# Patient Record
Sex: Female | Born: 1965 | Race: White | Hispanic: No | Marital: Married | Smoking: Former smoker
Health system: Southern US, Community
[De-identification: ages and names within clinical notes are randomized; demographics above are authoritative.]

## PROBLEM LIST (undated history)

## (undated) DIAGNOSIS — I2699 Other pulmonary embolism without acute cor pulmonale: Secondary | ICD-10-CM

## (undated) DIAGNOSIS — I10 Essential (primary) hypertension: Secondary | ICD-10-CM

## (undated) DIAGNOSIS — M1990 Unspecified osteoarthritis, unspecified site: Secondary | ICD-10-CM

## (undated) DIAGNOSIS — E079 Disorder of thyroid, unspecified: Secondary | ICD-10-CM

## (undated) DIAGNOSIS — K219 Gastro-esophageal reflux disease without esophagitis: Secondary | ICD-10-CM

## (undated) DIAGNOSIS — Z5189 Encounter for other specified aftercare: Secondary | ICD-10-CM

## (undated) DIAGNOSIS — N189 Chronic kidney disease, unspecified: Secondary | ICD-10-CM

## (undated) DIAGNOSIS — E785 Hyperlipidemia, unspecified: Secondary | ICD-10-CM

## (undated) DIAGNOSIS — F419 Anxiety disorder, unspecified: Secondary | ICD-10-CM

## (undated) DIAGNOSIS — F32A Depression, unspecified: Secondary | ICD-10-CM

## (undated) DIAGNOSIS — J939 Pneumothorax, unspecified: Secondary | ICD-10-CM

## (undated) HISTORY — PX: TUBAL LIGATION: SHX77

## (undated) HISTORY — PX: CHOLECYSTECTOMY: SHX55

## (undated) HISTORY — DX: Disorder of thyroid, unspecified: E07.9

## (undated) HISTORY — DX: Hyperlipidemia, unspecified: E78.5

## (undated) HISTORY — DX: Gastro-esophageal reflux disease without esophagitis: K21.9

## (undated) HISTORY — PX: CARPAL TUNNEL RELEASE: SHX101

## (undated) HISTORY — DX: Depression, unspecified: F32.A

## (undated) HISTORY — DX: Unspecified osteoarthritis, unspecified site: M19.90

## (undated) HISTORY — DX: Anxiety disorder, unspecified: F41.9

## (undated) HISTORY — DX: Chronic kidney disease, unspecified: N18.9

## (undated) HISTORY — DX: Essential (primary) hypertension: I10

## (undated) HISTORY — DX: Encounter for other specified aftercare: Z51.89

## (undated) HISTORY — DX: Pneumothorax, unspecified: J93.9

## (undated) HISTORY — DX: Other pulmonary embolism without acute cor pulmonale: I26.99

---
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
## 2018-06-19 DIAGNOSIS — E079 Disorder of thyroid, unspecified: Secondary | ICD-10-CM | POA: Insufficient documentation

## 2019-04-02 DIAGNOSIS — J320 Chronic maxillary sinusitis: Secondary | ICD-10-CM | POA: Insufficient documentation

## 2019-04-02 DIAGNOSIS — J322 Chronic ethmoidal sinusitis: Secondary | ICD-10-CM | POA: Insufficient documentation

## 2019-04-02 DIAGNOSIS — J339 Nasal polyp, unspecified: Secondary | ICD-10-CM | POA: Insufficient documentation

## 2019-04-02 DIAGNOSIS — J321 Chronic frontal sinusitis: Secondary | ICD-10-CM | POA: Insufficient documentation

## 2020-03-13 DIAGNOSIS — F329 Major depressive disorder, single episode, unspecified: Secondary | ICD-10-CM | POA: Insufficient documentation

## 2020-03-13 DIAGNOSIS — I10 Essential (primary) hypertension: Secondary | ICD-10-CM | POA: Insufficient documentation

## 2020-03-24 DIAGNOSIS — M542 Cervicalgia: Secondary | ICD-10-CM | POA: Insufficient documentation

## 2020-03-24 DIAGNOSIS — G8929 Other chronic pain: Secondary | ICD-10-CM | POA: Insufficient documentation

## 2020-12-26 DIAGNOSIS — U071 COVID-19: Secondary | ICD-10-CM

## 2020-12-26 HISTORY — DX: COVID-19: U07.1

## 2021-01-21 DIAGNOSIS — E785 Hyperlipidemia, unspecified: Secondary | ICD-10-CM | POA: Insufficient documentation

## 2021-01-26 DIAGNOSIS — J1282 Pneumonia due to coronavirus disease 2019: Secondary | ICD-10-CM | POA: Diagnosis not present

## 2021-01-26 DIAGNOSIS — U071 COVID-19: Secondary | ICD-10-CM | POA: Diagnosis not present

## 2021-01-26 DIAGNOSIS — N3090 Cystitis, unspecified without hematuria: Secondary | ICD-10-CM | POA: Diagnosis not present

## 2021-01-26 DIAGNOSIS — E079 Disorder of thyroid, unspecified: Secondary | ICD-10-CM | POA: Diagnosis not present

## 2021-01-26 DIAGNOSIS — E785 Hyperlipidemia, unspecified: Secondary | ICD-10-CM | POA: Diagnosis not present

## 2021-01-26 DIAGNOSIS — Z6841 Body Mass Index (BMI) 40.0 and over, adult: Secondary | ICD-10-CM | POA: Diagnosis not present

## 2021-01-26 DIAGNOSIS — I2699 Other pulmonary embolism without acute cor pulmonale: Secondary | ICD-10-CM | POA: Diagnosis not present

## 2021-01-26 DIAGNOSIS — R918 Other nonspecific abnormal finding of lung field: Secondary | ICD-10-CM | POA: Diagnosis not present

## 2021-01-26 DIAGNOSIS — J80 Acute respiratory distress syndrome: Secondary | ICD-10-CM | POA: Diagnosis not present

## 2021-01-26 DIAGNOSIS — N179 Acute kidney failure, unspecified: Secondary | ICD-10-CM | POA: Diagnosis not present

## 2021-01-26 DIAGNOSIS — J9601 Acute respiratory failure with hypoxia: Secondary | ICD-10-CM | POA: Diagnosis not present

## 2021-01-26 DIAGNOSIS — Z452 Encounter for adjustment and management of vascular access device: Secondary | ICD-10-CM | POA: Diagnosis not present

## 2021-01-26 DIAGNOSIS — I10 Essential (primary) hypertension: Secondary | ICD-10-CM | POA: Diagnosis not present

## 2021-01-27 DIAGNOSIS — I2699 Other pulmonary embolism without acute cor pulmonale: Secondary | ICD-10-CM | POA: Diagnosis not present

## 2021-01-27 DIAGNOSIS — U071 COVID-19: Secondary | ICD-10-CM | POA: Diagnosis not present

## 2021-01-27 DIAGNOSIS — J9601 Acute respiratory failure with hypoxia: Secondary | ICD-10-CM | POA: Diagnosis not present

## 2021-01-27 DIAGNOSIS — N3090 Cystitis, unspecified without hematuria: Secondary | ICD-10-CM | POA: Diagnosis not present

## 2021-01-27 DIAGNOSIS — E079 Disorder of thyroid, unspecified: Secondary | ICD-10-CM | POA: Diagnosis not present

## 2021-01-27 DIAGNOSIS — J1282 Pneumonia due to coronavirus disease 2019: Secondary | ICD-10-CM | POA: Diagnosis not present

## 2021-01-27 DIAGNOSIS — E785 Hyperlipidemia, unspecified: Secondary | ICD-10-CM | POA: Diagnosis not present

## 2021-01-27 DIAGNOSIS — I10 Essential (primary) hypertension: Secondary | ICD-10-CM | POA: Diagnosis not present

## 2021-01-27 DIAGNOSIS — J80 Acute respiratory distress syndrome: Secondary | ICD-10-CM | POA: Diagnosis not present

## 2021-01-27 DIAGNOSIS — N19 Unspecified kidney failure: Secondary | ICD-10-CM | POA: Diagnosis not present

## 2021-01-27 DIAGNOSIS — N179 Acute kidney failure, unspecified: Secondary | ICD-10-CM | POA: Diagnosis not present

## 2021-01-28 DIAGNOSIS — R748 Abnormal levels of other serum enzymes: Secondary | ICD-10-CM | POA: Insufficient documentation

## 2021-01-28 DIAGNOSIS — J1282 Pneumonia due to coronavirus disease 2019: Secondary | ICD-10-CM | POA: Diagnosis not present

## 2021-01-28 DIAGNOSIS — U071 COVID-19: Secondary | ICD-10-CM | POA: Diagnosis not present

## 2021-01-28 DIAGNOSIS — J9601 Acute respiratory failure with hypoxia: Secondary | ICD-10-CM | POA: Diagnosis not present

## 2021-01-28 DIAGNOSIS — I2699 Other pulmonary embolism without acute cor pulmonale: Secondary | ICD-10-CM | POA: Diagnosis not present

## 2021-01-29 DIAGNOSIS — J9602 Acute respiratory failure with hypercapnia: Secondary | ICD-10-CM | POA: Diagnosis not present

## 2021-01-29 DIAGNOSIS — R748 Abnormal levels of other serum enzymes: Secondary | ICD-10-CM | POA: Diagnosis not present

## 2021-01-29 DIAGNOSIS — R918 Other nonspecific abnormal finding of lung field: Secondary | ICD-10-CM | POA: Diagnosis not present

## 2021-01-29 DIAGNOSIS — E079 Disorder of thyroid, unspecified: Secondary | ICD-10-CM | POA: Diagnosis not present

## 2021-01-29 DIAGNOSIS — N170 Acute kidney failure with tubular necrosis: Secondary | ICD-10-CM | POA: Diagnosis not present

## 2021-01-29 DIAGNOSIS — Z4682 Encounter for fitting and adjustment of non-vascular catheter: Secondary | ICD-10-CM | POA: Diagnosis not present

## 2021-01-29 DIAGNOSIS — J9601 Acute respiratory failure with hypoxia: Secondary | ICD-10-CM | POA: Diagnosis not present

## 2021-01-29 DIAGNOSIS — E875 Hyperkalemia: Secondary | ICD-10-CM | POA: Diagnosis not present

## 2021-01-29 DIAGNOSIS — I2699 Other pulmonary embolism without acute cor pulmonale: Secondary | ICD-10-CM | POA: Diagnosis not present

## 2021-01-29 DIAGNOSIS — J1282 Pneumonia due to coronavirus disease 2019: Secondary | ICD-10-CM | POA: Diagnosis not present

## 2021-01-29 DIAGNOSIS — U071 COVID-19: Secondary | ICD-10-CM | POA: Diagnosis not present

## 2021-01-29 DIAGNOSIS — D6869 Other thrombophilia: Secondary | ICD-10-CM | POA: Diagnosis not present

## 2021-01-29 DIAGNOSIS — N179 Acute kidney failure, unspecified: Secondary | ICD-10-CM | POA: Diagnosis not present

## 2021-01-29 DIAGNOSIS — I513 Intracardiac thrombosis, not elsewhere classified: Secondary | ICD-10-CM | POA: Diagnosis not present

## 2021-01-30 DIAGNOSIS — J9601 Acute respiratory failure with hypoxia: Secondary | ICD-10-CM | POA: Diagnosis not present

## 2021-01-30 DIAGNOSIS — N179 Acute kidney failure, unspecified: Secondary | ICD-10-CM | POA: Diagnosis not present

## 2021-01-30 DIAGNOSIS — U071 COVID-19: Secondary | ICD-10-CM | POA: Diagnosis not present

## 2021-01-30 DIAGNOSIS — E875 Hyperkalemia: Secondary | ICD-10-CM | POA: Diagnosis not present

## 2021-01-30 DIAGNOSIS — R748 Abnormal levels of other serum enzymes: Secondary | ICD-10-CM | POA: Diagnosis not present

## 2021-01-30 DIAGNOSIS — D6869 Other thrombophilia: Secondary | ICD-10-CM | POA: Diagnosis not present

## 2021-01-30 DIAGNOSIS — I2699 Other pulmonary embolism without acute cor pulmonale: Secondary | ICD-10-CM | POA: Diagnosis not present

## 2021-01-30 DIAGNOSIS — E079 Disorder of thyroid, unspecified: Secondary | ICD-10-CM | POA: Diagnosis not present

## 2021-01-30 DIAGNOSIS — J9602 Acute respiratory failure with hypercapnia: Secondary | ICD-10-CM | POA: Diagnosis not present

## 2021-01-31 DIAGNOSIS — E079 Disorder of thyroid, unspecified: Secondary | ICD-10-CM | POA: Diagnosis not present

## 2021-01-31 DIAGNOSIS — R238 Other skin changes: Secondary | ICD-10-CM | POA: Diagnosis not present

## 2021-01-31 DIAGNOSIS — D6869 Other thrombophilia: Secondary | ICD-10-CM | POA: Diagnosis not present

## 2021-01-31 DIAGNOSIS — R748 Abnormal levels of other serum enzymes: Secondary | ICD-10-CM | POA: Diagnosis not present

## 2021-01-31 DIAGNOSIS — U071 COVID-19: Secondary | ICD-10-CM | POA: Diagnosis not present

## 2021-01-31 DIAGNOSIS — N179 Acute kidney failure, unspecified: Secondary | ICD-10-CM | POA: Diagnosis not present

## 2021-01-31 DIAGNOSIS — J9602 Acute respiratory failure with hypercapnia: Secondary | ICD-10-CM | POA: Diagnosis not present

## 2021-01-31 DIAGNOSIS — J9601 Acute respiratory failure with hypoxia: Secondary | ICD-10-CM | POA: Diagnosis not present

## 2021-01-31 DIAGNOSIS — R600 Localized edema: Secondary | ICD-10-CM | POA: Diagnosis not present

## 2021-01-31 DIAGNOSIS — E875 Hyperkalemia: Secondary | ICD-10-CM | POA: Diagnosis not present

## 2021-01-31 DIAGNOSIS — I2699 Other pulmonary embolism without acute cor pulmonale: Secondary | ICD-10-CM | POA: Diagnosis not present

## 2021-02-01 DIAGNOSIS — E079 Disorder of thyroid, unspecified: Secondary | ICD-10-CM | POA: Diagnosis not present

## 2021-02-01 DIAGNOSIS — N179 Acute kidney failure, unspecified: Secondary | ICD-10-CM | POA: Diagnosis not present

## 2021-02-01 DIAGNOSIS — E875 Hyperkalemia: Secondary | ICD-10-CM | POA: Diagnosis not present

## 2021-02-01 DIAGNOSIS — D6869 Other thrombophilia: Secondary | ICD-10-CM | POA: Diagnosis not present

## 2021-02-01 DIAGNOSIS — U071 COVID-19: Secondary | ICD-10-CM | POA: Diagnosis not present

## 2021-02-01 DIAGNOSIS — J9602 Acute respiratory failure with hypercapnia: Secondary | ICD-10-CM | POA: Diagnosis not present

## 2021-02-01 DIAGNOSIS — R748 Abnormal levels of other serum enzymes: Secondary | ICD-10-CM | POA: Diagnosis not present

## 2021-02-01 DIAGNOSIS — J9601 Acute respiratory failure with hypoxia: Secondary | ICD-10-CM | POA: Diagnosis not present

## 2021-02-01 DIAGNOSIS — I2699 Other pulmonary embolism without acute cor pulmonale: Secondary | ICD-10-CM | POA: Diagnosis not present

## 2021-02-02 DIAGNOSIS — J9601 Acute respiratory failure with hypoxia: Secondary | ICD-10-CM | POA: Diagnosis not present

## 2021-02-02 DIAGNOSIS — N179 Acute kidney failure, unspecified: Secondary | ICD-10-CM | POA: Diagnosis not present

## 2021-02-02 DIAGNOSIS — R748 Abnormal levels of other serum enzymes: Secondary | ICD-10-CM | POA: Diagnosis not present

## 2021-02-02 DIAGNOSIS — E079 Disorder of thyroid, unspecified: Secondary | ICD-10-CM | POA: Diagnosis not present

## 2021-02-02 DIAGNOSIS — D6869 Other thrombophilia: Secondary | ICD-10-CM | POA: Diagnosis not present

## 2021-02-02 DIAGNOSIS — J9602 Acute respiratory failure with hypercapnia: Secondary | ICD-10-CM | POA: Diagnosis not present

## 2021-02-02 DIAGNOSIS — I2699 Other pulmonary embolism without acute cor pulmonale: Secondary | ICD-10-CM | POA: Diagnosis not present

## 2021-02-02 DIAGNOSIS — E875 Hyperkalemia: Secondary | ICD-10-CM | POA: Diagnosis not present

## 2021-02-02 DIAGNOSIS — U071 COVID-19: Secondary | ICD-10-CM | POA: Diagnosis not present

## 2021-02-03 DIAGNOSIS — I2699 Other pulmonary embolism without acute cor pulmonale: Secondary | ICD-10-CM | POA: Diagnosis not present

## 2021-02-03 DIAGNOSIS — J9602 Acute respiratory failure with hypercapnia: Secondary | ICD-10-CM | POA: Diagnosis not present

## 2021-02-03 DIAGNOSIS — E875 Hyperkalemia: Secondary | ICD-10-CM | POA: Diagnosis not present

## 2021-02-03 DIAGNOSIS — E079 Disorder of thyroid, unspecified: Secondary | ICD-10-CM | POA: Diagnosis not present

## 2021-02-03 DIAGNOSIS — J9601 Acute respiratory failure with hypoxia: Secondary | ICD-10-CM | POA: Diagnosis not present

## 2021-02-03 DIAGNOSIS — D6869 Other thrombophilia: Secondary | ICD-10-CM | POA: Diagnosis not present

## 2021-02-03 DIAGNOSIS — U071 COVID-19: Secondary | ICD-10-CM | POA: Diagnosis not present

## 2021-02-03 DIAGNOSIS — R748 Abnormal levels of other serum enzymes: Secondary | ICD-10-CM | POA: Diagnosis not present

## 2021-02-03 DIAGNOSIS — N179 Acute kidney failure, unspecified: Secondary | ICD-10-CM | POA: Diagnosis not present

## 2021-02-04 DIAGNOSIS — U071 COVID-19: Secondary | ICD-10-CM | POA: Diagnosis not present

## 2021-02-04 DIAGNOSIS — N179 Acute kidney failure, unspecified: Secondary | ICD-10-CM | POA: Diagnosis not present

## 2021-02-04 DIAGNOSIS — Z4682 Encounter for fitting and adjustment of non-vascular catheter: Secondary | ICD-10-CM | POA: Diagnosis not present

## 2021-02-04 DIAGNOSIS — E079 Disorder of thyroid, unspecified: Secondary | ICD-10-CM | POA: Diagnosis not present

## 2021-02-04 DIAGNOSIS — I2699 Other pulmonary embolism without acute cor pulmonale: Secondary | ICD-10-CM | POA: Diagnosis not present

## 2021-02-04 DIAGNOSIS — J9601 Acute respiratory failure with hypoxia: Secondary | ICD-10-CM | POA: Diagnosis not present

## 2021-02-04 DIAGNOSIS — E875 Hyperkalemia: Secondary | ICD-10-CM | POA: Diagnosis not present

## 2021-02-04 DIAGNOSIS — R0602 Shortness of breath: Secondary | ICD-10-CM | POA: Diagnosis not present

## 2021-02-04 DIAGNOSIS — J984 Other disorders of lung: Secondary | ICD-10-CM | POA: Diagnosis not present

## 2021-02-04 DIAGNOSIS — R748 Abnormal levels of other serum enzymes: Secondary | ICD-10-CM | POA: Diagnosis not present

## 2021-02-04 DIAGNOSIS — D6869 Other thrombophilia: Secondary | ICD-10-CM | POA: Diagnosis not present

## 2021-02-04 DIAGNOSIS — J9602 Acute respiratory failure with hypercapnia: Secondary | ICD-10-CM | POA: Diagnosis not present

## 2021-02-05 DIAGNOSIS — Z4682 Encounter for fitting and adjustment of non-vascular catheter: Secondary | ICD-10-CM | POA: Diagnosis not present

## 2021-02-05 DIAGNOSIS — J9602 Acute respiratory failure with hypercapnia: Secondary | ICD-10-CM | POA: Diagnosis not present

## 2021-02-05 DIAGNOSIS — D6869 Other thrombophilia: Secondary | ICD-10-CM | POA: Diagnosis not present

## 2021-02-05 DIAGNOSIS — J9601 Acute respiratory failure with hypoxia: Secondary | ICD-10-CM | POA: Diagnosis not present

## 2021-02-05 DIAGNOSIS — I513 Intracardiac thrombosis, not elsewhere classified: Secondary | ICD-10-CM | POA: Diagnosis not present

## 2021-02-05 DIAGNOSIS — J1282 Pneumonia due to coronavirus disease 2019: Secondary | ICD-10-CM | POA: Diagnosis not present

## 2021-02-05 DIAGNOSIS — J9383 Other pneumothorax: Secondary | ICD-10-CM | POA: Diagnosis not present

## 2021-02-05 DIAGNOSIS — U071 COVID-19: Secondary | ICD-10-CM | POA: Diagnosis not present

## 2021-02-05 DIAGNOSIS — N170 Acute kidney failure with tubular necrosis: Secondary | ICD-10-CM | POA: Diagnosis not present

## 2021-02-05 DIAGNOSIS — N179 Acute kidney failure, unspecified: Secondary | ICD-10-CM | POA: Diagnosis not present

## 2021-02-06 DIAGNOSIS — Z4682 Encounter for fitting and adjustment of non-vascular catheter: Secondary | ICD-10-CM | POA: Diagnosis not present

## 2021-02-06 DIAGNOSIS — U071 COVID-19: Secondary | ICD-10-CM | POA: Diagnosis not present

## 2021-02-06 DIAGNOSIS — R0603 Acute respiratory distress: Secondary | ICD-10-CM | POA: Diagnosis not present

## 2021-02-06 DIAGNOSIS — K7290 Hepatic failure, unspecified without coma: Secondary | ICD-10-CM | POA: Diagnosis not present

## 2021-02-06 DIAGNOSIS — Z992 Dependence on renal dialysis: Secondary | ICD-10-CM | POA: Diagnosis not present

## 2021-02-06 DIAGNOSIS — J1282 Pneumonia due to coronavirus disease 2019: Secondary | ICD-10-CM | POA: Diagnosis not present

## 2021-02-06 DIAGNOSIS — R001 Bradycardia, unspecified: Secondary | ICD-10-CM | POA: Diagnosis not present

## 2021-02-06 DIAGNOSIS — D6869 Other thrombophilia: Secondary | ICD-10-CM | POA: Diagnosis not present

## 2021-02-06 DIAGNOSIS — I513 Intracardiac thrombosis, not elsewhere classified: Secondary | ICD-10-CM | POA: Diagnosis not present

## 2021-02-06 DIAGNOSIS — J939 Pneumothorax, unspecified: Secondary | ICD-10-CM | POA: Diagnosis not present

## 2021-02-06 DIAGNOSIS — N170 Acute kidney failure with tubular necrosis: Secondary | ICD-10-CM | POA: Diagnosis not present

## 2021-02-06 DIAGNOSIS — J9602 Acute respiratory failure with hypercapnia: Secondary | ICD-10-CM | POA: Diagnosis not present

## 2021-02-06 DIAGNOSIS — J9383 Other pneumothorax: Secondary | ICD-10-CM | POA: Diagnosis not present

## 2021-02-06 DIAGNOSIS — I455 Other specified heart block: Secondary | ICD-10-CM | POA: Diagnosis not present

## 2021-02-06 DIAGNOSIS — N179 Acute kidney failure, unspecified: Secondary | ICD-10-CM | POA: Diagnosis not present

## 2021-02-06 DIAGNOSIS — J9601 Acute respiratory failure with hypoxia: Secondary | ICD-10-CM | POA: Diagnosis not present

## 2021-02-07 DIAGNOSIS — J9602 Acute respiratory failure with hypercapnia: Secondary | ICD-10-CM | POA: Diagnosis not present

## 2021-02-07 DIAGNOSIS — I513 Intracardiac thrombosis, not elsewhere classified: Secondary | ICD-10-CM | POA: Diagnosis not present

## 2021-02-07 DIAGNOSIS — U071 COVID-19: Secondary | ICD-10-CM | POA: Diagnosis not present

## 2021-02-07 DIAGNOSIS — R0602 Shortness of breath: Secondary | ICD-10-CM | POA: Diagnosis not present

## 2021-02-07 DIAGNOSIS — I455 Other specified heart block: Secondary | ICD-10-CM | POA: Diagnosis not present

## 2021-02-07 DIAGNOSIS — Z992 Dependence on renal dialysis: Secondary | ICD-10-CM | POA: Diagnosis not present

## 2021-02-07 DIAGNOSIS — K7290 Hepatic failure, unspecified without coma: Secondary | ICD-10-CM | POA: Diagnosis not present

## 2021-02-07 DIAGNOSIS — D6869 Other thrombophilia: Secondary | ICD-10-CM | POA: Diagnosis not present

## 2021-02-07 DIAGNOSIS — R001 Bradycardia, unspecified: Secondary | ICD-10-CM | POA: Diagnosis not present

## 2021-02-07 DIAGNOSIS — N179 Acute kidney failure, unspecified: Secondary | ICD-10-CM | POA: Diagnosis not present

## 2021-02-07 DIAGNOSIS — N170 Acute kidney failure with tubular necrosis: Secondary | ICD-10-CM | POA: Diagnosis not present

## 2021-02-07 DIAGNOSIS — J1282 Pneumonia due to coronavirus disease 2019: Secondary | ICD-10-CM | POA: Diagnosis not present

## 2021-02-07 DIAGNOSIS — J9601 Acute respiratory failure with hypoxia: Secondary | ICD-10-CM | POA: Diagnosis not present

## 2021-02-08 DIAGNOSIS — R4182 Altered mental status, unspecified: Secondary | ICD-10-CM | POA: Diagnosis not present

## 2021-02-08 DIAGNOSIS — J1282 Pneumonia due to coronavirus disease 2019: Secondary | ICD-10-CM | POA: Diagnosis not present

## 2021-02-08 DIAGNOSIS — I2699 Other pulmonary embolism without acute cor pulmonale: Secondary | ICD-10-CM | POA: Diagnosis not present

## 2021-02-08 DIAGNOSIS — U071 COVID-19: Secondary | ICD-10-CM | POA: Diagnosis not present

## 2021-02-08 DIAGNOSIS — D6859 Other primary thrombophilia: Secondary | ICD-10-CM | POA: Diagnosis not present

## 2021-02-08 DIAGNOSIS — J9601 Acute respiratory failure with hypoxia: Secondary | ICD-10-CM | POA: Diagnosis not present

## 2021-02-08 DIAGNOSIS — N179 Acute kidney failure, unspecified: Secondary | ICD-10-CM | POA: Diagnosis not present

## 2021-02-08 DIAGNOSIS — R579 Shock, unspecified: Secondary | ICD-10-CM | POA: Diagnosis not present

## 2021-02-08 DIAGNOSIS — J939 Pneumothorax, unspecified: Secondary | ICD-10-CM | POA: Diagnosis not present

## 2021-02-08 DIAGNOSIS — I513 Intracardiac thrombosis, not elsewhere classified: Secondary | ICD-10-CM | POA: Diagnosis not present

## 2021-02-09 DIAGNOSIS — J9601 Acute respiratory failure with hypoxia: Secondary | ICD-10-CM | POA: Diagnosis not present

## 2021-02-09 DIAGNOSIS — I513 Intracardiac thrombosis, not elsewhere classified: Secondary | ICD-10-CM | POA: Diagnosis not present

## 2021-02-09 DIAGNOSIS — Z4682 Encounter for fitting and adjustment of non-vascular catheter: Secondary | ICD-10-CM | POA: Diagnosis not present

## 2021-02-09 DIAGNOSIS — J1282 Pneumonia due to coronavirus disease 2019: Secondary | ICD-10-CM | POA: Diagnosis not present

## 2021-02-09 DIAGNOSIS — R579 Shock, unspecified: Secondary | ICD-10-CM | POA: Diagnosis not present

## 2021-02-09 DIAGNOSIS — I2699 Other pulmonary embolism without acute cor pulmonale: Secondary | ICD-10-CM | POA: Diagnosis not present

## 2021-02-09 DIAGNOSIS — D6859 Other primary thrombophilia: Secondary | ICD-10-CM | POA: Diagnosis not present

## 2021-02-09 DIAGNOSIS — N179 Acute kidney failure, unspecified: Secondary | ICD-10-CM | POA: Diagnosis not present

## 2021-02-09 DIAGNOSIS — U071 COVID-19: Secondary | ICD-10-CM | POA: Diagnosis not present

## 2021-02-09 DIAGNOSIS — J984 Other disorders of lung: Secondary | ICD-10-CM | POA: Diagnosis not present

## 2021-02-09 DIAGNOSIS — J939 Pneumothorax, unspecified: Secondary | ICD-10-CM | POA: Diagnosis not present

## 2021-02-09 DIAGNOSIS — R0602 Shortness of breath: Secondary | ICD-10-CM | POA: Diagnosis not present

## 2021-02-10 DIAGNOSIS — J1282 Pneumonia due to coronavirus disease 2019: Secondary | ICD-10-CM | POA: Diagnosis not present

## 2021-02-10 DIAGNOSIS — J9620 Acute and chronic respiratory failure, unspecified whether with hypoxia or hypercapnia: Secondary | ICD-10-CM | POA: Diagnosis not present

## 2021-02-10 DIAGNOSIS — R140 Abdominal distension (gaseous): Secondary | ICD-10-CM | POA: Diagnosis not present

## 2021-02-10 DIAGNOSIS — R579 Shock, unspecified: Secondary | ICD-10-CM | POA: Diagnosis not present

## 2021-02-10 DIAGNOSIS — J9601 Acute respiratory failure with hypoxia: Secondary | ICD-10-CM | POA: Diagnosis not present

## 2021-02-10 DIAGNOSIS — B379 Candidiasis, unspecified: Secondary | ICD-10-CM | POA: Diagnosis not present

## 2021-02-10 DIAGNOSIS — I2699 Other pulmonary embolism without acute cor pulmonale: Secondary | ICD-10-CM | POA: Diagnosis not present

## 2021-02-10 DIAGNOSIS — J80 Acute respiratory distress syndrome: Secondary | ICD-10-CM | POA: Diagnosis not present

## 2021-02-10 DIAGNOSIS — D6859 Other primary thrombophilia: Secondary | ICD-10-CM | POA: Diagnosis not present

## 2021-02-10 DIAGNOSIS — Z4659 Encounter for fitting and adjustment of other gastrointestinal appliance and device: Secondary | ICD-10-CM | POA: Diagnosis not present

## 2021-02-10 DIAGNOSIS — N179 Acute kidney failure, unspecified: Secondary | ICD-10-CM | POA: Diagnosis not present

## 2021-02-10 DIAGNOSIS — Z4682 Encounter for fitting and adjustment of non-vascular catheter: Secondary | ICD-10-CM | POA: Diagnosis not present

## 2021-02-10 DIAGNOSIS — R918 Other nonspecific abnormal finding of lung field: Secondary | ICD-10-CM | POA: Diagnosis not present

## 2021-02-10 DIAGNOSIS — I513 Intracardiac thrombosis, not elsewhere classified: Secondary | ICD-10-CM | POA: Diagnosis not present

## 2021-02-10 DIAGNOSIS — U071 COVID-19: Secondary | ICD-10-CM | POA: Diagnosis not present

## 2021-02-10 DIAGNOSIS — R0602 Shortness of breath: Secondary | ICD-10-CM | POA: Diagnosis not present

## 2021-02-10 DIAGNOSIS — J939 Pneumothorax, unspecified: Secondary | ICD-10-CM | POA: Diagnosis not present

## 2021-02-11 DIAGNOSIS — J939 Pneumothorax, unspecified: Secondary | ICD-10-CM | POA: Diagnosis not present

## 2021-02-11 DIAGNOSIS — D6859 Other primary thrombophilia: Secondary | ICD-10-CM | POA: Diagnosis not present

## 2021-02-11 DIAGNOSIS — R579 Shock, unspecified: Secondary | ICD-10-CM | POA: Diagnosis not present

## 2021-02-11 DIAGNOSIS — I513 Intracardiac thrombosis, not elsewhere classified: Secondary | ICD-10-CM | POA: Diagnosis not present

## 2021-02-11 DIAGNOSIS — J1282 Pneumonia due to coronavirus disease 2019: Secondary | ICD-10-CM | POA: Diagnosis not present

## 2021-02-11 DIAGNOSIS — N179 Acute kidney failure, unspecified: Secondary | ICD-10-CM | POA: Diagnosis not present

## 2021-02-11 DIAGNOSIS — I2699 Other pulmonary embolism without acute cor pulmonale: Secondary | ICD-10-CM | POA: Diagnosis not present

## 2021-02-11 DIAGNOSIS — J9601 Acute respiratory failure with hypoxia: Secondary | ICD-10-CM | POA: Diagnosis not present

## 2021-02-11 DIAGNOSIS — U071 COVID-19: Secondary | ICD-10-CM | POA: Diagnosis not present

## 2021-02-12 DIAGNOSIS — N179 Acute kidney failure, unspecified: Secondary | ICD-10-CM | POA: Diagnosis not present

## 2021-02-12 DIAGNOSIS — U071 COVID-19: Secondary | ICD-10-CM | POA: Diagnosis not present

## 2021-02-12 DIAGNOSIS — J939 Pneumothorax, unspecified: Secondary | ICD-10-CM | POA: Diagnosis not present

## 2021-02-12 DIAGNOSIS — J1282 Pneumonia due to coronavirus disease 2019: Secondary | ICD-10-CM | POA: Diagnosis not present

## 2021-02-12 DIAGNOSIS — I2699 Other pulmonary embolism without acute cor pulmonale: Secondary | ICD-10-CM | POA: Diagnosis not present

## 2021-02-12 DIAGNOSIS — J9601 Acute respiratory failure with hypoxia: Secondary | ICD-10-CM | POA: Diagnosis not present

## 2021-02-12 DIAGNOSIS — I513 Intracardiac thrombosis, not elsewhere classified: Secondary | ICD-10-CM | POA: Diagnosis not present

## 2021-02-12 DIAGNOSIS — Z452 Encounter for adjustment and management of vascular access device: Secondary | ICD-10-CM | POA: Diagnosis not present

## 2021-02-12 DIAGNOSIS — N19 Unspecified kidney failure: Secondary | ICD-10-CM | POA: Diagnosis not present

## 2021-02-12 DIAGNOSIS — R579 Shock, unspecified: Secondary | ICD-10-CM | POA: Diagnosis not present

## 2021-02-12 DIAGNOSIS — D6859 Other primary thrombophilia: Secondary | ICD-10-CM | POA: Diagnosis not present

## 2021-02-13 DIAGNOSIS — D6859 Other primary thrombophilia: Secondary | ICD-10-CM | POA: Diagnosis not present

## 2021-02-13 DIAGNOSIS — J90 Pleural effusion, not elsewhere classified: Secondary | ICD-10-CM | POA: Diagnosis not present

## 2021-02-13 DIAGNOSIS — J939 Pneumothorax, unspecified: Secondary | ICD-10-CM | POA: Diagnosis not present

## 2021-02-13 DIAGNOSIS — N179 Acute kidney failure, unspecified: Secondary | ICD-10-CM | POA: Diagnosis not present

## 2021-02-13 DIAGNOSIS — J1282 Pneumonia due to coronavirus disease 2019: Secondary | ICD-10-CM | POA: Diagnosis not present

## 2021-02-13 DIAGNOSIS — N1330 Unspecified hydronephrosis: Secondary | ICD-10-CM | POA: Diagnosis not present

## 2021-02-13 DIAGNOSIS — I513 Intracardiac thrombosis, not elsewhere classified: Secondary | ICD-10-CM | POA: Diagnosis not present

## 2021-02-13 DIAGNOSIS — R579 Shock, unspecified: Secondary | ICD-10-CM | POA: Diagnosis not present

## 2021-02-13 DIAGNOSIS — U071 COVID-19: Secondary | ICD-10-CM | POA: Diagnosis not present

## 2021-02-13 DIAGNOSIS — I2699 Other pulmonary embolism without acute cor pulmonale: Secondary | ICD-10-CM | POA: Diagnosis not present

## 2021-02-13 DIAGNOSIS — R930 Abnormal findings on diagnostic imaging of skull and head, not elsewhere classified: Secondary | ICD-10-CM | POA: Diagnosis not present

## 2021-02-13 DIAGNOSIS — J9601 Acute respiratory failure with hypoxia: Secondary | ICD-10-CM | POA: Diagnosis not present

## 2021-02-14 DIAGNOSIS — I513 Intracardiac thrombosis, not elsewhere classified: Secondary | ICD-10-CM | POA: Diagnosis not present

## 2021-02-14 DIAGNOSIS — R748 Abnormal levels of other serum enzymes: Secondary | ICD-10-CM | POA: Diagnosis not present

## 2021-02-14 DIAGNOSIS — N179 Acute kidney failure, unspecified: Secondary | ICD-10-CM | POA: Diagnosis not present

## 2021-02-14 DIAGNOSIS — R1310 Dysphagia, unspecified: Secondary | ICD-10-CM | POA: Diagnosis not present

## 2021-02-14 DIAGNOSIS — J9601 Acute respiratory failure with hypoxia: Secondary | ICD-10-CM | POA: Diagnosis not present

## 2021-02-14 DIAGNOSIS — E079 Disorder of thyroid, unspecified: Secondary | ICD-10-CM | POA: Diagnosis not present

## 2021-02-14 DIAGNOSIS — U071 COVID-19: Secondary | ICD-10-CM | POA: Diagnosis not present

## 2021-02-14 DIAGNOSIS — J9312 Secondary spontaneous pneumothorax: Secondary | ICD-10-CM | POA: Diagnosis not present

## 2021-02-14 DIAGNOSIS — I2699 Other pulmonary embolism without acute cor pulmonale: Secondary | ICD-10-CM | POA: Diagnosis not present

## 2021-02-14 DIAGNOSIS — J9602 Acute respiratory failure with hypercapnia: Secondary | ICD-10-CM | POA: Diagnosis not present

## 2021-02-14 DIAGNOSIS — E875 Hyperkalemia: Secondary | ICD-10-CM | POA: Diagnosis not present

## 2021-02-15 DIAGNOSIS — R1310 Dysphagia, unspecified: Secondary | ICD-10-CM | POA: Diagnosis not present

## 2021-02-15 DIAGNOSIS — J9602 Acute respiratory failure with hypercapnia: Secondary | ICD-10-CM | POA: Diagnosis not present

## 2021-02-15 DIAGNOSIS — D6869 Other thrombophilia: Secondary | ICD-10-CM | POA: Diagnosis not present

## 2021-02-15 DIAGNOSIS — J9312 Secondary spontaneous pneumothorax: Secondary | ICD-10-CM | POA: Diagnosis not present

## 2021-02-15 DIAGNOSIS — K7200 Acute and subacute hepatic failure without coma: Secondary | ICD-10-CM | POA: Diagnosis not present

## 2021-02-15 DIAGNOSIS — J1282 Pneumonia due to coronavirus disease 2019: Secondary | ICD-10-CM | POA: Diagnosis not present

## 2021-02-15 DIAGNOSIS — I2699 Other pulmonary embolism without acute cor pulmonale: Secondary | ICD-10-CM | POA: Diagnosis not present

## 2021-02-15 DIAGNOSIS — J9601 Acute respiratory failure with hypoxia: Secondary | ICD-10-CM | POA: Diagnosis not present

## 2021-02-15 DIAGNOSIS — I513 Intracardiac thrombosis, not elsewhere classified: Secondary | ICD-10-CM | POA: Diagnosis not present

## 2021-02-15 DIAGNOSIS — N170 Acute kidney failure with tubular necrosis: Secondary | ICD-10-CM | POA: Diagnosis not present

## 2021-02-15 DIAGNOSIS — U071 COVID-19: Secondary | ICD-10-CM | POA: Diagnosis not present

## 2021-02-15 DIAGNOSIS — N179 Acute kidney failure, unspecified: Secondary | ICD-10-CM | POA: Diagnosis not present

## 2021-02-16 DIAGNOSIS — I513 Intracardiac thrombosis, not elsewhere classified: Secondary | ICD-10-CM | POA: Diagnosis not present

## 2021-02-16 DIAGNOSIS — N179 Acute kidney failure, unspecified: Secondary | ICD-10-CM | POA: Diagnosis not present

## 2021-02-16 DIAGNOSIS — U071 COVID-19: Secondary | ICD-10-CM | POA: Diagnosis not present

## 2021-02-16 DIAGNOSIS — I2699 Other pulmonary embolism without acute cor pulmonale: Secondary | ICD-10-CM | POA: Diagnosis not present

## 2021-02-16 DIAGNOSIS — N170 Acute kidney failure with tubular necrosis: Secondary | ICD-10-CM | POA: Diagnosis not present

## 2021-02-16 DIAGNOSIS — J1282 Pneumonia due to coronavirus disease 2019: Secondary | ICD-10-CM | POA: Diagnosis not present

## 2021-02-16 DIAGNOSIS — D6869 Other thrombophilia: Secondary | ICD-10-CM | POA: Diagnosis not present

## 2021-02-16 DIAGNOSIS — J9601 Acute respiratory failure with hypoxia: Secondary | ICD-10-CM | POA: Diagnosis not present

## 2021-02-16 DIAGNOSIS — K7200 Acute and subacute hepatic failure without coma: Secondary | ICD-10-CM | POA: Diagnosis not present

## 2021-02-16 DIAGNOSIS — J9312 Secondary spontaneous pneumothorax: Secondary | ICD-10-CM | POA: Diagnosis not present

## 2021-02-16 DIAGNOSIS — R1310 Dysphagia, unspecified: Secondary | ICD-10-CM | POA: Diagnosis not present

## 2021-02-17 DIAGNOSIS — J9312 Secondary spontaneous pneumothorax: Secondary | ICD-10-CM | POA: Diagnosis not present

## 2021-02-17 DIAGNOSIS — N170 Acute kidney failure with tubular necrosis: Secondary | ICD-10-CM | POA: Diagnosis not present

## 2021-02-17 DIAGNOSIS — J9602 Acute respiratory failure with hypercapnia: Secondary | ICD-10-CM | POA: Diagnosis not present

## 2021-02-17 DIAGNOSIS — I2699 Other pulmonary embolism without acute cor pulmonale: Secondary | ICD-10-CM | POA: Diagnosis not present

## 2021-02-17 DIAGNOSIS — J1282 Pneumonia due to coronavirus disease 2019: Secondary | ICD-10-CM | POA: Diagnosis not present

## 2021-02-17 DIAGNOSIS — I513 Intracardiac thrombosis, not elsewhere classified: Secondary | ICD-10-CM | POA: Diagnosis not present

## 2021-02-17 DIAGNOSIS — D6869 Other thrombophilia: Secondary | ICD-10-CM | POA: Diagnosis not present

## 2021-02-17 DIAGNOSIS — K7200 Acute and subacute hepatic failure without coma: Secondary | ICD-10-CM | POA: Diagnosis not present

## 2021-02-17 DIAGNOSIS — J9601 Acute respiratory failure with hypoxia: Secondary | ICD-10-CM | POA: Diagnosis not present

## 2021-02-17 DIAGNOSIS — U071 COVID-19: Secondary | ICD-10-CM | POA: Diagnosis not present

## 2021-02-17 DIAGNOSIS — N179 Acute kidney failure, unspecified: Secondary | ICD-10-CM | POA: Diagnosis not present

## 2021-02-17 DIAGNOSIS — R1310 Dysphagia, unspecified: Secondary | ICD-10-CM | POA: Diagnosis not present

## 2021-02-18 DIAGNOSIS — K7200 Acute and subacute hepatic failure without coma: Secondary | ICD-10-CM | POA: Diagnosis not present

## 2021-02-18 DIAGNOSIS — N179 Acute kidney failure, unspecified: Secondary | ICD-10-CM | POA: Diagnosis not present

## 2021-02-18 DIAGNOSIS — U071 COVID-19: Secondary | ICD-10-CM | POA: Diagnosis not present

## 2021-02-18 DIAGNOSIS — I2699 Other pulmonary embolism without acute cor pulmonale: Secondary | ICD-10-CM | POA: Diagnosis not present

## 2021-02-18 DIAGNOSIS — J9601 Acute respiratory failure with hypoxia: Secondary | ICD-10-CM | POA: Diagnosis not present

## 2021-02-18 DIAGNOSIS — J1282 Pneumonia due to coronavirus disease 2019: Secondary | ICD-10-CM | POA: Diagnosis not present

## 2021-02-18 DIAGNOSIS — N170 Acute kidney failure with tubular necrosis: Secondary | ICD-10-CM | POA: Diagnosis not present

## 2021-02-18 DIAGNOSIS — J9312 Secondary spontaneous pneumothorax: Secondary | ICD-10-CM | POA: Diagnosis not present

## 2021-02-18 DIAGNOSIS — R918 Other nonspecific abnormal finding of lung field: Secondary | ICD-10-CM | POA: Diagnosis not present

## 2021-02-18 DIAGNOSIS — D6869 Other thrombophilia: Secondary | ICD-10-CM | POA: Diagnosis not present

## 2021-02-18 DIAGNOSIS — J189 Pneumonia, unspecified organism: Secondary | ICD-10-CM | POA: Diagnosis not present

## 2021-02-18 DIAGNOSIS — I513 Intracardiac thrombosis, not elsewhere classified: Secondary | ICD-10-CM | POA: Diagnosis not present

## 2021-02-19 DIAGNOSIS — D6869 Other thrombophilia: Secondary | ICD-10-CM | POA: Diagnosis not present

## 2021-02-19 DIAGNOSIS — K7200 Acute and subacute hepatic failure without coma: Secondary | ICD-10-CM | POA: Diagnosis not present

## 2021-02-19 DIAGNOSIS — I513 Intracardiac thrombosis, not elsewhere classified: Secondary | ICD-10-CM | POA: Diagnosis not present

## 2021-02-19 DIAGNOSIS — J9601 Acute respiratory failure with hypoxia: Secondary | ICD-10-CM | POA: Diagnosis not present

## 2021-02-19 DIAGNOSIS — U071 COVID-19: Secondary | ICD-10-CM | POA: Diagnosis not present

## 2021-02-19 DIAGNOSIS — N170 Acute kidney failure with tubular necrosis: Secondary | ICD-10-CM | POA: Diagnosis not present

## 2021-02-19 DIAGNOSIS — J9312 Secondary spontaneous pneumothorax: Secondary | ICD-10-CM | POA: Diagnosis not present

## 2021-02-19 DIAGNOSIS — J1282 Pneumonia due to coronavirus disease 2019: Secondary | ICD-10-CM | POA: Diagnosis not present

## 2021-02-19 DIAGNOSIS — I2699 Other pulmonary embolism without acute cor pulmonale: Secondary | ICD-10-CM | POA: Diagnosis not present

## 2021-02-19 DIAGNOSIS — N179 Acute kidney failure, unspecified: Secondary | ICD-10-CM | POA: Diagnosis not present

## 2021-02-20 DIAGNOSIS — N179 Acute kidney failure, unspecified: Secondary | ICD-10-CM | POA: Diagnosis not present

## 2021-02-20 DIAGNOSIS — I2699 Other pulmonary embolism without acute cor pulmonale: Secondary | ICD-10-CM | POA: Diagnosis not present

## 2021-02-20 DIAGNOSIS — J9312 Secondary spontaneous pneumothorax: Secondary | ICD-10-CM | POA: Diagnosis not present

## 2021-02-20 DIAGNOSIS — J1282 Pneumonia due to coronavirus disease 2019: Secondary | ICD-10-CM | POA: Diagnosis not present

## 2021-02-20 DIAGNOSIS — J9601 Acute respiratory failure with hypoxia: Secondary | ICD-10-CM | POA: Diagnosis not present

## 2021-02-20 DIAGNOSIS — I513 Intracardiac thrombosis, not elsewhere classified: Secondary | ICD-10-CM | POA: Diagnosis not present

## 2021-02-20 DIAGNOSIS — N170 Acute kidney failure with tubular necrosis: Secondary | ICD-10-CM | POA: Diagnosis not present

## 2021-02-20 DIAGNOSIS — D6869 Other thrombophilia: Secondary | ICD-10-CM | POA: Diagnosis not present

## 2021-02-20 DIAGNOSIS — K7200 Acute and subacute hepatic failure without coma: Secondary | ICD-10-CM | POA: Diagnosis not present

## 2021-02-20 DIAGNOSIS — U071 COVID-19: Secondary | ICD-10-CM | POA: Diagnosis not present

## 2021-02-21 DIAGNOSIS — J9601 Acute respiratory failure with hypoxia: Secondary | ICD-10-CM | POA: Diagnosis not present

## 2021-02-21 DIAGNOSIS — J9312 Secondary spontaneous pneumothorax: Secondary | ICD-10-CM | POA: Diagnosis not present

## 2021-02-21 DIAGNOSIS — I2699 Other pulmonary embolism without acute cor pulmonale: Secondary | ICD-10-CM | POA: Diagnosis not present

## 2021-02-21 DIAGNOSIS — K7200 Acute and subacute hepatic failure without coma: Secondary | ICD-10-CM | POA: Diagnosis not present

## 2021-02-21 DIAGNOSIS — N170 Acute kidney failure with tubular necrosis: Secondary | ICD-10-CM | POA: Diagnosis not present

## 2021-02-21 DIAGNOSIS — I513 Intracardiac thrombosis, not elsewhere classified: Secondary | ICD-10-CM | POA: Diagnosis not present

## 2021-02-21 DIAGNOSIS — D6869 Other thrombophilia: Secondary | ICD-10-CM | POA: Diagnosis not present

## 2021-02-21 DIAGNOSIS — U071 COVID-19: Secondary | ICD-10-CM | POA: Diagnosis not present

## 2021-02-21 DIAGNOSIS — J1282 Pneumonia due to coronavirus disease 2019: Secondary | ICD-10-CM | POA: Diagnosis not present

## 2021-02-22 DIAGNOSIS — N179 Acute kidney failure, unspecified: Secondary | ICD-10-CM | POA: Diagnosis not present

## 2021-02-22 DIAGNOSIS — J9601 Acute respiratory failure with hypoxia: Secondary | ICD-10-CM | POA: Diagnosis not present

## 2021-02-22 DIAGNOSIS — N170 Acute kidney failure with tubular necrosis: Secondary | ICD-10-CM | POA: Diagnosis not present

## 2021-02-22 DIAGNOSIS — G9341 Metabolic encephalopathy: Secondary | ICD-10-CM | POA: Diagnosis not present

## 2021-02-22 DIAGNOSIS — I2699 Other pulmonary embolism without acute cor pulmonale: Secondary | ICD-10-CM | POA: Diagnosis not present

## 2021-02-22 DIAGNOSIS — J9602 Acute respiratory failure with hypercapnia: Secondary | ICD-10-CM | POA: Diagnosis not present

## 2021-02-23 DIAGNOSIS — N170 Acute kidney failure with tubular necrosis: Secondary | ICD-10-CM | POA: Diagnosis not present

## 2021-02-23 DIAGNOSIS — R0602 Shortness of breath: Secondary | ICD-10-CM | POA: Diagnosis not present

## 2021-02-23 DIAGNOSIS — I2699 Other pulmonary embolism without acute cor pulmonale: Secondary | ICD-10-CM | POA: Diagnosis not present

## 2021-02-23 DIAGNOSIS — N179 Acute kidney failure, unspecified: Secondary | ICD-10-CM | POA: Diagnosis not present

## 2021-02-23 DIAGNOSIS — J9601 Acute respiratory failure with hypoxia: Secondary | ICD-10-CM | POA: Diagnosis not present

## 2021-02-23 DIAGNOSIS — J9602 Acute respiratory failure with hypercapnia: Secondary | ICD-10-CM | POA: Diagnosis not present

## 2021-02-23 DIAGNOSIS — G9341 Metabolic encephalopathy: Secondary | ICD-10-CM | POA: Diagnosis not present

## 2021-02-24 DIAGNOSIS — I2699 Other pulmonary embolism without acute cor pulmonale: Secondary | ICD-10-CM | POA: Diagnosis not present

## 2021-02-24 DIAGNOSIS — J9601 Acute respiratory failure with hypoxia: Secondary | ICD-10-CM | POA: Diagnosis not present

## 2021-02-24 DIAGNOSIS — N179 Acute kidney failure, unspecified: Secondary | ICD-10-CM | POA: Diagnosis not present

## 2021-02-24 DIAGNOSIS — J939 Pneumothorax, unspecified: Secondary | ICD-10-CM | POA: Diagnosis not present

## 2021-02-24 DIAGNOSIS — G9341 Metabolic encephalopathy: Secondary | ICD-10-CM | POA: Diagnosis not present

## 2021-02-24 DIAGNOSIS — J9602 Acute respiratory failure with hypercapnia: Secondary | ICD-10-CM | POA: Diagnosis not present

## 2021-02-24 DIAGNOSIS — N170 Acute kidney failure with tubular necrosis: Secondary | ICD-10-CM | POA: Diagnosis not present

## 2021-02-25 DIAGNOSIS — I2699 Other pulmonary embolism without acute cor pulmonale: Secondary | ICD-10-CM | POA: Diagnosis not present

## 2021-02-25 DIAGNOSIS — G9341 Metabolic encephalopathy: Secondary | ICD-10-CM | POA: Diagnosis not present

## 2021-02-25 DIAGNOSIS — N179 Acute kidney failure, unspecified: Secondary | ICD-10-CM | POA: Diagnosis not present

## 2021-02-25 DIAGNOSIS — N170 Acute kidney failure with tubular necrosis: Secondary | ICD-10-CM | POA: Diagnosis not present

## 2021-02-25 DIAGNOSIS — J9602 Acute respiratory failure with hypercapnia: Secondary | ICD-10-CM | POA: Diagnosis not present

## 2021-02-25 DIAGNOSIS — J9601 Acute respiratory failure with hypoxia: Secondary | ICD-10-CM | POA: Diagnosis not present

## 2021-02-26 DIAGNOSIS — E875 Hyperkalemia: Secondary | ICD-10-CM | POA: Diagnosis not present

## 2021-02-26 DIAGNOSIS — J9312 Secondary spontaneous pneumothorax: Secondary | ICD-10-CM | POA: Diagnosis not present

## 2021-02-26 DIAGNOSIS — J1282 Pneumonia due to coronavirus disease 2019: Secondary | ICD-10-CM | POA: Diagnosis not present

## 2021-02-26 DIAGNOSIS — J9602 Acute respiratory failure with hypercapnia: Secondary | ICD-10-CM | POA: Diagnosis not present

## 2021-02-26 DIAGNOSIS — R748 Abnormal levels of other serum enzymes: Secondary | ICD-10-CM | POA: Diagnosis not present

## 2021-02-26 DIAGNOSIS — N179 Acute kidney failure, unspecified: Secondary | ICD-10-CM | POA: Diagnosis not present

## 2021-02-26 DIAGNOSIS — I513 Intracardiac thrombosis, not elsewhere classified: Secondary | ICD-10-CM | POA: Diagnosis not present

## 2021-02-26 DIAGNOSIS — G9341 Metabolic encephalopathy: Secondary | ICD-10-CM | POA: Diagnosis not present

## 2021-02-26 DIAGNOSIS — J9601 Acute respiratory failure with hypoxia: Secondary | ICD-10-CM | POA: Diagnosis not present

## 2021-02-26 DIAGNOSIS — E079 Disorder of thyroid, unspecified: Secondary | ICD-10-CM | POA: Diagnosis not present

## 2021-02-26 DIAGNOSIS — U071 COVID-19: Secondary | ICD-10-CM | POA: Diagnosis not present

## 2021-02-26 DIAGNOSIS — D6869 Other thrombophilia: Secondary | ICD-10-CM | POA: Diagnosis not present

## 2021-02-26 DIAGNOSIS — I2699 Other pulmonary embolism without acute cor pulmonale: Secondary | ICD-10-CM | POA: Diagnosis not present

## 2021-02-26 DIAGNOSIS — N170 Acute kidney failure with tubular necrosis: Secondary | ICD-10-CM | POA: Diagnosis not present

## 2021-02-27 DIAGNOSIS — N170 Acute kidney failure with tubular necrosis: Secondary | ICD-10-CM | POA: Diagnosis not present

## 2021-02-27 DIAGNOSIS — G9341 Metabolic encephalopathy: Secondary | ICD-10-CM | POA: Diagnosis not present

## 2021-02-27 DIAGNOSIS — J9602 Acute respiratory failure with hypercapnia: Secondary | ICD-10-CM | POA: Diagnosis not present

## 2021-02-27 DIAGNOSIS — D6869 Other thrombophilia: Secondary | ICD-10-CM | POA: Diagnosis not present

## 2021-02-27 DIAGNOSIS — J9312 Secondary spontaneous pneumothorax: Secondary | ICD-10-CM | POA: Diagnosis not present

## 2021-02-27 DIAGNOSIS — E079 Disorder of thyroid, unspecified: Secondary | ICD-10-CM | POA: Diagnosis not present

## 2021-02-27 DIAGNOSIS — J1282 Pneumonia due to coronavirus disease 2019: Secondary | ICD-10-CM | POA: Diagnosis not present

## 2021-02-27 DIAGNOSIS — I2699 Other pulmonary embolism without acute cor pulmonale: Secondary | ICD-10-CM | POA: Diagnosis not present

## 2021-02-27 DIAGNOSIS — U071 COVID-19: Secondary | ICD-10-CM | POA: Diagnosis not present

## 2021-02-27 DIAGNOSIS — J9601 Acute respiratory failure with hypoxia: Secondary | ICD-10-CM | POA: Diagnosis not present

## 2021-02-27 DIAGNOSIS — I513 Intracardiac thrombosis, not elsewhere classified: Secondary | ICD-10-CM | POA: Diagnosis not present

## 2021-02-27 DIAGNOSIS — E875 Hyperkalemia: Secondary | ICD-10-CM | POA: Diagnosis not present

## 2021-02-27 DIAGNOSIS — R748 Abnormal levels of other serum enzymes: Secondary | ICD-10-CM | POA: Diagnosis not present

## 2021-02-28 DIAGNOSIS — G9341 Metabolic encephalopathy: Secondary | ICD-10-CM | POA: Diagnosis not present

## 2021-02-28 DIAGNOSIS — J9602 Acute respiratory failure with hypercapnia: Secondary | ICD-10-CM | POA: Diagnosis not present

## 2021-02-28 DIAGNOSIS — U071 COVID-19: Secondary | ICD-10-CM | POA: Diagnosis not present

## 2021-02-28 DIAGNOSIS — N170 Acute kidney failure with tubular necrosis: Secondary | ICD-10-CM | POA: Diagnosis not present

## 2021-02-28 DIAGNOSIS — J1282 Pneumonia due to coronavirus disease 2019: Secondary | ICD-10-CM | POA: Diagnosis not present

## 2021-02-28 DIAGNOSIS — J9601 Acute respiratory failure with hypoxia: Secondary | ICD-10-CM | POA: Diagnosis not present

## 2021-02-28 DIAGNOSIS — D6869 Other thrombophilia: Secondary | ICD-10-CM | POA: Diagnosis not present

## 2021-02-28 DIAGNOSIS — R748 Abnormal levels of other serum enzymes: Secondary | ICD-10-CM | POA: Diagnosis not present

## 2021-02-28 DIAGNOSIS — I513 Intracardiac thrombosis, not elsewhere classified: Secondary | ICD-10-CM | POA: Diagnosis not present

## 2021-02-28 DIAGNOSIS — J9312 Secondary spontaneous pneumothorax: Secondary | ICD-10-CM | POA: Diagnosis not present

## 2021-02-28 DIAGNOSIS — E875 Hyperkalemia: Secondary | ICD-10-CM | POA: Diagnosis not present

## 2021-02-28 DIAGNOSIS — I2699 Other pulmonary embolism without acute cor pulmonale: Secondary | ICD-10-CM | POA: Diagnosis not present

## 2021-02-28 DIAGNOSIS — E079 Disorder of thyroid, unspecified: Secondary | ICD-10-CM | POA: Diagnosis not present

## 2021-02-28 DIAGNOSIS — J189 Pneumonia, unspecified organism: Secondary | ICD-10-CM | POA: Diagnosis not present

## 2021-02-28 DIAGNOSIS — Z452 Encounter for adjustment and management of vascular access device: Secondary | ICD-10-CM | POA: Diagnosis not present

## 2021-03-01 DIAGNOSIS — I513 Intracardiac thrombosis, not elsewhere classified: Secondary | ICD-10-CM | POA: Diagnosis not present

## 2021-03-01 DIAGNOSIS — J9312 Secondary spontaneous pneumothorax: Secondary | ICD-10-CM | POA: Diagnosis not present

## 2021-03-01 DIAGNOSIS — E079 Disorder of thyroid, unspecified: Secondary | ICD-10-CM | POA: Diagnosis not present

## 2021-03-01 DIAGNOSIS — Z6841 Body Mass Index (BMI) 40.0 and over, adult: Secondary | ICD-10-CM | POA: Diagnosis not present

## 2021-03-01 DIAGNOSIS — N170 Acute kidney failure with tubular necrosis: Secondary | ICD-10-CM | POA: Diagnosis not present

## 2021-03-01 DIAGNOSIS — J9602 Acute respiratory failure with hypercapnia: Secondary | ICD-10-CM | POA: Diagnosis not present

## 2021-03-01 DIAGNOSIS — I2699 Other pulmonary embolism without acute cor pulmonale: Secondary | ICD-10-CM | POA: Diagnosis not present

## 2021-03-01 DIAGNOSIS — J1282 Pneumonia due to coronavirus disease 2019: Secondary | ICD-10-CM | POA: Diagnosis not present

## 2021-03-01 DIAGNOSIS — J9601 Acute respiratory failure with hypoxia: Secondary | ICD-10-CM | POA: Diagnosis not present

## 2021-03-01 DIAGNOSIS — G9341 Metabolic encephalopathy: Secondary | ICD-10-CM | POA: Diagnosis not present

## 2021-03-01 DIAGNOSIS — D6869 Other thrombophilia: Secondary | ICD-10-CM | POA: Diagnosis not present

## 2021-03-01 DIAGNOSIS — U071 COVID-19: Secondary | ICD-10-CM | POA: Diagnosis not present

## 2021-03-01 DIAGNOSIS — N179 Acute kidney failure, unspecified: Secondary | ICD-10-CM | POA: Diagnosis not present

## 2021-03-02 DIAGNOSIS — I2699 Other pulmonary embolism without acute cor pulmonale: Secondary | ICD-10-CM | POA: Diagnosis not present

## 2021-03-02 DIAGNOSIS — U071 COVID-19: Secondary | ICD-10-CM | POA: Diagnosis not present

## 2021-03-02 DIAGNOSIS — J9601 Acute respiratory failure with hypoxia: Secondary | ICD-10-CM | POA: Diagnosis not present

## 2021-03-02 DIAGNOSIS — G9341 Metabolic encephalopathy: Secondary | ICD-10-CM | POA: Diagnosis not present

## 2021-03-02 DIAGNOSIS — J1282 Pneumonia due to coronavirus disease 2019: Secondary | ICD-10-CM | POA: Diagnosis not present

## 2021-03-02 DIAGNOSIS — J9602 Acute respiratory failure with hypercapnia: Secondary | ICD-10-CM | POA: Diagnosis not present

## 2021-03-02 DIAGNOSIS — N179 Acute kidney failure, unspecified: Secondary | ICD-10-CM | POA: Diagnosis not present

## 2021-03-02 DIAGNOSIS — Z6841 Body Mass Index (BMI) 40.0 and over, adult: Secondary | ICD-10-CM | POA: Diagnosis not present

## 2021-03-02 DIAGNOSIS — N170 Acute kidney failure with tubular necrosis: Secondary | ICD-10-CM | POA: Diagnosis not present

## 2021-03-02 DIAGNOSIS — E870 Hyperosmolality and hypernatremia: Secondary | ICD-10-CM | POA: Diagnosis not present

## 2021-03-02 DIAGNOSIS — Z4682 Encounter for fitting and adjustment of non-vascular catheter: Secondary | ICD-10-CM | POA: Diagnosis not present

## 2021-03-02 DIAGNOSIS — D6869 Other thrombophilia: Secondary | ICD-10-CM | POA: Diagnosis not present

## 2021-03-02 DIAGNOSIS — J984 Other disorders of lung: Secondary | ICD-10-CM | POA: Diagnosis not present

## 2021-03-02 DIAGNOSIS — J189 Pneumonia, unspecified organism: Secondary | ICD-10-CM | POA: Diagnosis not present

## 2021-03-02 DIAGNOSIS — Z930 Tracheostomy status: Secondary | ICD-10-CM | POA: Diagnosis not present

## 2021-03-03 DIAGNOSIS — R1310 Dysphagia, unspecified: Secondary | ICD-10-CM | POA: Diagnosis not present

## 2021-03-03 DIAGNOSIS — J1282 Pneumonia due to coronavirus disease 2019: Secondary | ICD-10-CM | POA: Diagnosis not present

## 2021-03-03 DIAGNOSIS — J90 Pleural effusion, not elsewhere classified: Secondary | ICD-10-CM | POA: Diagnosis not present

## 2021-03-03 DIAGNOSIS — N179 Acute kidney failure, unspecified: Secondary | ICD-10-CM | POA: Diagnosis not present

## 2021-03-03 DIAGNOSIS — D6869 Other thrombophilia: Secondary | ICD-10-CM | POA: Diagnosis not present

## 2021-03-03 DIAGNOSIS — J9601 Acute respiratory failure with hypoxia: Secondary | ICD-10-CM | POA: Diagnosis not present

## 2021-03-03 DIAGNOSIS — R633 Feeding difficulties, unspecified: Secondary | ICD-10-CM | POA: Diagnosis not present

## 2021-03-03 DIAGNOSIS — Z6841 Body Mass Index (BMI) 40.0 and over, adult: Secondary | ICD-10-CM | POA: Diagnosis not present

## 2021-03-03 DIAGNOSIS — U071 COVID-19: Secondary | ICD-10-CM | POA: Diagnosis not present

## 2021-03-03 DIAGNOSIS — G9341 Metabolic encephalopathy: Secondary | ICD-10-CM | POA: Diagnosis not present

## 2021-03-03 DIAGNOSIS — I2699 Other pulmonary embolism without acute cor pulmonale: Secondary | ICD-10-CM | POA: Diagnosis not present

## 2021-03-03 DIAGNOSIS — E870 Hyperosmolality and hypernatremia: Secondary | ICD-10-CM | POA: Diagnosis not present

## 2021-03-03 DIAGNOSIS — N170 Acute kidney failure with tubular necrosis: Secondary | ICD-10-CM | POA: Diagnosis not present

## 2021-03-03 DIAGNOSIS — J9602 Acute respiratory failure with hypercapnia: Secondary | ICD-10-CM | POA: Diagnosis not present

## 2021-03-04 DIAGNOSIS — J9602 Acute respiratory failure with hypercapnia: Secondary | ICD-10-CM | POA: Diagnosis not present

## 2021-03-04 DIAGNOSIS — N170 Acute kidney failure with tubular necrosis: Secondary | ICD-10-CM | POA: Diagnosis not present

## 2021-03-04 DIAGNOSIS — I2699 Other pulmonary embolism without acute cor pulmonale: Secondary | ICD-10-CM | POA: Diagnosis not present

## 2021-03-04 DIAGNOSIS — Z6841 Body Mass Index (BMI) 40.0 and over, adult: Secondary | ICD-10-CM | POA: Diagnosis not present

## 2021-03-04 DIAGNOSIS — R918 Other nonspecific abnormal finding of lung field: Secondary | ICD-10-CM | POA: Diagnosis not present

## 2021-03-04 DIAGNOSIS — E870 Hyperosmolality and hypernatremia: Secondary | ICD-10-CM | POA: Diagnosis not present

## 2021-03-04 DIAGNOSIS — U071 COVID-19: Secondary | ICD-10-CM | POA: Diagnosis not present

## 2021-03-04 DIAGNOSIS — J9601 Acute respiratory failure with hypoxia: Secondary | ICD-10-CM | POA: Diagnosis not present

## 2021-03-04 DIAGNOSIS — N179 Acute kidney failure, unspecified: Secondary | ICD-10-CM | POA: Diagnosis not present

## 2021-03-04 DIAGNOSIS — D6869 Other thrombophilia: Secondary | ICD-10-CM | POA: Diagnosis not present

## 2021-03-04 DIAGNOSIS — J1282 Pneumonia due to coronavirus disease 2019: Secondary | ICD-10-CM | POA: Diagnosis not present

## 2021-03-04 DIAGNOSIS — R0602 Shortness of breath: Secondary | ICD-10-CM | POA: Diagnosis not present

## 2021-03-04 DIAGNOSIS — G9341 Metabolic encephalopathy: Secondary | ICD-10-CM | POA: Diagnosis not present

## 2021-03-05 DIAGNOSIS — I513 Intracardiac thrombosis, not elsewhere classified: Secondary | ICD-10-CM | POA: Diagnosis not present

## 2021-03-05 DIAGNOSIS — I2699 Other pulmonary embolism without acute cor pulmonale: Secondary | ICD-10-CM | POA: Diagnosis not present

## 2021-03-05 DIAGNOSIS — J9312 Secondary spontaneous pneumothorax: Secondary | ICD-10-CM | POA: Diagnosis not present

## 2021-03-05 DIAGNOSIS — J9601 Acute respiratory failure with hypoxia: Secondary | ICD-10-CM | POA: Diagnosis not present

## 2021-03-05 DIAGNOSIS — U071 COVID-19: Secondary | ICD-10-CM | POA: Diagnosis not present

## 2021-03-05 DIAGNOSIS — J1282 Pneumonia due to coronavirus disease 2019: Secondary | ICD-10-CM | POA: Diagnosis not present

## 2021-03-05 DIAGNOSIS — E870 Hyperosmolality and hypernatremia: Secondary | ICD-10-CM | POA: Diagnosis not present

## 2021-03-05 DIAGNOSIS — D6869 Other thrombophilia: Secondary | ICD-10-CM | POA: Diagnosis not present

## 2021-03-05 DIAGNOSIS — R0602 Shortness of breath: Secondary | ICD-10-CM | POA: Diagnosis not present

## 2021-03-05 DIAGNOSIS — G9341 Metabolic encephalopathy: Secondary | ICD-10-CM | POA: Diagnosis not present

## 2021-03-05 DIAGNOSIS — N170 Acute kidney failure with tubular necrosis: Secondary | ICD-10-CM | POA: Diagnosis not present

## 2021-03-05 DIAGNOSIS — K7200 Acute and subacute hepatic failure without coma: Secondary | ICD-10-CM | POA: Diagnosis not present

## 2021-03-05 DIAGNOSIS — R918 Other nonspecific abnormal finding of lung field: Secondary | ICD-10-CM | POA: Diagnosis not present

## 2021-03-06 DIAGNOSIS — R0602 Shortness of breath: Secondary | ICD-10-CM | POA: Diagnosis not present

## 2021-03-06 DIAGNOSIS — D6869 Other thrombophilia: Secondary | ICD-10-CM | POA: Diagnosis not present

## 2021-03-06 DIAGNOSIS — U071 COVID-19: Secondary | ICD-10-CM | POA: Diagnosis not present

## 2021-03-06 DIAGNOSIS — J9601 Acute respiratory failure with hypoxia: Secondary | ICD-10-CM | POA: Diagnosis not present

## 2021-03-06 DIAGNOSIS — J1282 Pneumonia due to coronavirus disease 2019: Secondary | ICD-10-CM | POA: Diagnosis not present

## 2021-03-06 DIAGNOSIS — G9341 Metabolic encephalopathy: Secondary | ICD-10-CM | POA: Diagnosis not present

## 2021-03-06 DIAGNOSIS — E870 Hyperosmolality and hypernatremia: Secondary | ICD-10-CM | POA: Diagnosis not present

## 2021-03-06 DIAGNOSIS — K7200 Acute and subacute hepatic failure without coma: Secondary | ICD-10-CM | POA: Diagnosis not present

## 2021-03-06 DIAGNOSIS — I2699 Other pulmonary embolism without acute cor pulmonale: Secondary | ICD-10-CM | POA: Diagnosis not present

## 2021-03-06 DIAGNOSIS — J9312 Secondary spontaneous pneumothorax: Secondary | ICD-10-CM | POA: Diagnosis not present

## 2021-03-06 DIAGNOSIS — N170 Acute kidney failure with tubular necrosis: Secondary | ICD-10-CM | POA: Diagnosis not present

## 2021-03-06 DIAGNOSIS — I513 Intracardiac thrombosis, not elsewhere classified: Secondary | ICD-10-CM | POA: Diagnosis not present

## 2021-03-07 DIAGNOSIS — J1282 Pneumonia due to coronavirus disease 2019: Secondary | ICD-10-CM | POA: Diagnosis not present

## 2021-03-07 DIAGNOSIS — U071 COVID-19: Secondary | ICD-10-CM | POA: Diagnosis not present

## 2021-03-07 DIAGNOSIS — D6869 Other thrombophilia: Secondary | ICD-10-CM | POA: Diagnosis not present

## 2021-03-07 DIAGNOSIS — T7840XA Allergy, unspecified, initial encounter: Secondary | ICD-10-CM | POA: Diagnosis not present

## 2021-03-07 DIAGNOSIS — K7200 Acute and subacute hepatic failure without coma: Secondary | ICD-10-CM | POA: Diagnosis not present

## 2021-03-07 DIAGNOSIS — I513 Intracardiac thrombosis, not elsewhere classified: Secondary | ICD-10-CM | POA: Diagnosis not present

## 2021-03-07 DIAGNOSIS — N170 Acute kidney failure with tubular necrosis: Secondary | ICD-10-CM | POA: Diagnosis not present

## 2021-03-07 DIAGNOSIS — E870 Hyperosmolality and hypernatremia: Secondary | ICD-10-CM | POA: Diagnosis not present

## 2021-03-07 DIAGNOSIS — I2699 Other pulmonary embolism without acute cor pulmonale: Secondary | ICD-10-CM | POA: Diagnosis not present

## 2021-03-07 DIAGNOSIS — G9341 Metabolic encephalopathy: Secondary | ICD-10-CM | POA: Diagnosis not present

## 2021-03-07 DIAGNOSIS — J9601 Acute respiratory failure with hypoxia: Secondary | ICD-10-CM | POA: Diagnosis not present

## 2021-03-07 DIAGNOSIS — J9312 Secondary spontaneous pneumothorax: Secondary | ICD-10-CM | POA: Diagnosis not present

## 2021-03-08 DIAGNOSIS — K7200 Acute and subacute hepatic failure without coma: Secondary | ICD-10-CM | POA: Diagnosis not present

## 2021-03-08 DIAGNOSIS — T7840XA Allergy, unspecified, initial encounter: Secondary | ICD-10-CM | POA: Diagnosis not present

## 2021-03-08 DIAGNOSIS — E870 Hyperosmolality and hypernatremia: Secondary | ICD-10-CM | POA: Diagnosis not present

## 2021-03-08 DIAGNOSIS — D6869 Other thrombophilia: Secondary | ICD-10-CM | POA: Diagnosis not present

## 2021-03-08 DIAGNOSIS — J9312 Secondary spontaneous pneumothorax: Secondary | ICD-10-CM | POA: Diagnosis not present

## 2021-03-08 DIAGNOSIS — G9341 Metabolic encephalopathy: Secondary | ICD-10-CM | POA: Diagnosis not present

## 2021-03-08 DIAGNOSIS — I513 Intracardiac thrombosis, not elsewhere classified: Secondary | ICD-10-CM | POA: Diagnosis not present

## 2021-03-08 DIAGNOSIS — R0602 Shortness of breath: Secondary | ICD-10-CM | POA: Diagnosis not present

## 2021-03-08 DIAGNOSIS — J9601 Acute respiratory failure with hypoxia: Secondary | ICD-10-CM | POA: Diagnosis not present

## 2021-03-08 DIAGNOSIS — N170 Acute kidney failure with tubular necrosis: Secondary | ICD-10-CM | POA: Diagnosis not present

## 2021-03-08 DIAGNOSIS — I2699 Other pulmonary embolism without acute cor pulmonale: Secondary | ICD-10-CM | POA: Diagnosis not present

## 2021-03-08 DIAGNOSIS — J1282 Pneumonia due to coronavirus disease 2019: Secondary | ICD-10-CM | POA: Diagnosis not present

## 2021-03-08 DIAGNOSIS — U071 COVID-19: Secondary | ICD-10-CM | POA: Diagnosis not present

## 2021-03-09 DIAGNOSIS — E079 Disorder of thyroid, unspecified: Secondary | ICD-10-CM | POA: Diagnosis not present

## 2021-03-09 DIAGNOSIS — I513 Intracardiac thrombosis, not elsewhere classified: Secondary | ICD-10-CM | POA: Diagnosis not present

## 2021-03-09 DIAGNOSIS — D6869 Other thrombophilia: Secondary | ICD-10-CM | POA: Diagnosis not present

## 2021-03-09 DIAGNOSIS — U071 COVID-19: Secondary | ICD-10-CM | POA: Diagnosis not present

## 2021-03-09 DIAGNOSIS — G9341 Metabolic encephalopathy: Secondary | ICD-10-CM | POA: Diagnosis not present

## 2021-03-09 DIAGNOSIS — I2782 Chronic pulmonary embolism: Secondary | ICD-10-CM | POA: Diagnosis not present

## 2021-03-09 DIAGNOSIS — R1310 Dysphagia, unspecified: Secondary | ICD-10-CM | POA: Diagnosis not present

## 2021-03-09 DIAGNOSIS — J1282 Pneumonia due to coronavirus disease 2019: Secondary | ICD-10-CM | POA: Diagnosis not present

## 2021-03-09 DIAGNOSIS — J9601 Acute respiratory failure with hypoxia: Secondary | ICD-10-CM | POA: Diagnosis not present

## 2021-03-09 DIAGNOSIS — Z6841 Body Mass Index (BMI) 40.0 and over, adult: Secondary | ICD-10-CM | POA: Diagnosis not present

## 2021-03-10 DIAGNOSIS — I513 Intracardiac thrombosis, not elsewhere classified: Secondary | ICD-10-CM | POA: Diagnosis not present

## 2021-03-10 DIAGNOSIS — J9601 Acute respiratory failure with hypoxia: Secondary | ICD-10-CM | POA: Diagnosis not present

## 2021-03-10 DIAGNOSIS — G9341 Metabolic encephalopathy: Secondary | ICD-10-CM | POA: Diagnosis not present

## 2021-03-10 DIAGNOSIS — Z6841 Body Mass Index (BMI) 40.0 and over, adult: Secondary | ICD-10-CM | POA: Diagnosis not present

## 2021-03-10 DIAGNOSIS — R633 Feeding difficulties, unspecified: Secondary | ICD-10-CM | POA: Diagnosis not present

## 2021-03-10 DIAGNOSIS — E079 Disorder of thyroid, unspecified: Secondary | ICD-10-CM | POA: Diagnosis not present

## 2021-03-10 DIAGNOSIS — U071 COVID-19: Secondary | ICD-10-CM | POA: Diagnosis not present

## 2021-03-10 DIAGNOSIS — R1310 Dysphagia, unspecified: Secondary | ICD-10-CM | POA: Diagnosis not present

## 2021-03-10 DIAGNOSIS — D6869 Other thrombophilia: Secondary | ICD-10-CM | POA: Diagnosis not present

## 2021-03-10 DIAGNOSIS — J1282 Pneumonia due to coronavirus disease 2019: Secondary | ICD-10-CM | POA: Diagnosis not present

## 2021-03-10 DIAGNOSIS — I2782 Chronic pulmonary embolism: Secondary | ICD-10-CM | POA: Diagnosis not present

## 2021-03-11 DIAGNOSIS — Z6841 Body Mass Index (BMI) 40.0 and over, adult: Secondary | ICD-10-CM | POA: Diagnosis not present

## 2021-03-11 DIAGNOSIS — D6869 Other thrombophilia: Secondary | ICD-10-CM | POA: Diagnosis not present

## 2021-03-11 DIAGNOSIS — U071 COVID-19: Secondary | ICD-10-CM | POA: Diagnosis not present

## 2021-03-11 DIAGNOSIS — G9341 Metabolic encephalopathy: Secondary | ICD-10-CM | POA: Diagnosis not present

## 2021-03-11 DIAGNOSIS — I513 Intracardiac thrombosis, not elsewhere classified: Secondary | ICD-10-CM | POA: Diagnosis not present

## 2021-03-11 DIAGNOSIS — I2782 Chronic pulmonary embolism: Secondary | ICD-10-CM | POA: Diagnosis not present

## 2021-03-11 DIAGNOSIS — E079 Disorder of thyroid, unspecified: Secondary | ICD-10-CM | POA: Diagnosis not present

## 2021-03-11 DIAGNOSIS — J1282 Pneumonia due to coronavirus disease 2019: Secondary | ICD-10-CM | POA: Diagnosis not present

## 2021-03-11 DIAGNOSIS — J9601 Acute respiratory failure with hypoxia: Secondary | ICD-10-CM | POA: Diagnosis not present

## 2021-03-12 DIAGNOSIS — G9341 Metabolic encephalopathy: Secondary | ICD-10-CM | POA: Diagnosis not present

## 2021-03-12 DIAGNOSIS — J1282 Pneumonia due to coronavirus disease 2019: Secondary | ICD-10-CM | POA: Diagnosis not present

## 2021-03-12 DIAGNOSIS — Z6841 Body Mass Index (BMI) 40.0 and over, adult: Secondary | ICD-10-CM | POA: Diagnosis not present

## 2021-03-12 DIAGNOSIS — I513 Intracardiac thrombosis, not elsewhere classified: Secondary | ICD-10-CM | POA: Diagnosis not present

## 2021-03-12 DIAGNOSIS — E079 Disorder of thyroid, unspecified: Secondary | ICD-10-CM | POA: Diagnosis not present

## 2021-03-12 DIAGNOSIS — J9601 Acute respiratory failure with hypoxia: Secondary | ICD-10-CM | POA: Diagnosis not present

## 2021-03-12 DIAGNOSIS — U071 COVID-19: Secondary | ICD-10-CM | POA: Diagnosis not present

## 2021-03-12 DIAGNOSIS — I2782 Chronic pulmonary embolism: Secondary | ICD-10-CM | POA: Diagnosis not present

## 2021-03-12 DIAGNOSIS — D6869 Other thrombophilia: Secondary | ICD-10-CM | POA: Diagnosis not present

## 2021-03-13 DIAGNOSIS — Z6841 Body Mass Index (BMI) 40.0 and over, adult: Secondary | ICD-10-CM | POA: Diagnosis not present

## 2021-03-13 DIAGNOSIS — I513 Intracardiac thrombosis, not elsewhere classified: Secondary | ICD-10-CM | POA: Diagnosis not present

## 2021-03-13 DIAGNOSIS — J1282 Pneumonia due to coronavirus disease 2019: Secondary | ICD-10-CM | POA: Diagnosis not present

## 2021-03-13 DIAGNOSIS — U071 COVID-19: Secondary | ICD-10-CM | POA: Diagnosis not present

## 2021-03-13 DIAGNOSIS — G9341 Metabolic encephalopathy: Secondary | ICD-10-CM | POA: Diagnosis not present

## 2021-03-13 DIAGNOSIS — D6869 Other thrombophilia: Secondary | ICD-10-CM | POA: Diagnosis not present

## 2021-03-13 DIAGNOSIS — I2782 Chronic pulmonary embolism: Secondary | ICD-10-CM | POA: Diagnosis not present

## 2021-03-13 DIAGNOSIS — J9601 Acute respiratory failure with hypoxia: Secondary | ICD-10-CM | POA: Diagnosis not present

## 2021-03-13 DIAGNOSIS — E079 Disorder of thyroid, unspecified: Secondary | ICD-10-CM | POA: Diagnosis not present

## 2021-03-14 DIAGNOSIS — J9601 Acute respiratory failure with hypoxia: Secondary | ICD-10-CM | POA: Diagnosis not present

## 2021-03-14 DIAGNOSIS — I2782 Chronic pulmonary embolism: Secondary | ICD-10-CM | POA: Diagnosis not present

## 2021-03-14 DIAGNOSIS — Z6841 Body Mass Index (BMI) 40.0 and over, adult: Secondary | ICD-10-CM | POA: Diagnosis not present

## 2021-03-14 DIAGNOSIS — U071 COVID-19: Secondary | ICD-10-CM | POA: Diagnosis not present

## 2021-03-14 DIAGNOSIS — E079 Disorder of thyroid, unspecified: Secondary | ICD-10-CM | POA: Diagnosis not present

## 2021-03-14 DIAGNOSIS — D6869 Other thrombophilia: Secondary | ICD-10-CM | POA: Diagnosis not present

## 2021-03-14 DIAGNOSIS — J1282 Pneumonia due to coronavirus disease 2019: Secondary | ICD-10-CM | POA: Diagnosis not present

## 2021-03-14 DIAGNOSIS — I513 Intracardiac thrombosis, not elsewhere classified: Secondary | ICD-10-CM | POA: Diagnosis not present

## 2021-03-14 DIAGNOSIS — G9341 Metabolic encephalopathy: Secondary | ICD-10-CM | POA: Diagnosis not present

## 2021-03-15 DIAGNOSIS — I513 Intracardiac thrombosis, not elsewhere classified: Secondary | ICD-10-CM | POA: Diagnosis not present

## 2021-03-15 DIAGNOSIS — J1282 Pneumonia due to coronavirus disease 2019: Secondary | ICD-10-CM | POA: Diagnosis not present

## 2021-03-15 DIAGNOSIS — J9601 Acute respiratory failure with hypoxia: Secondary | ICD-10-CM | POA: Diagnosis not present

## 2021-03-15 DIAGNOSIS — D6869 Other thrombophilia: Secondary | ICD-10-CM | POA: Diagnosis not present

## 2021-03-15 DIAGNOSIS — G9341 Metabolic encephalopathy: Secondary | ICD-10-CM | POA: Diagnosis not present

## 2021-03-15 DIAGNOSIS — U071 COVID-19: Secondary | ICD-10-CM | POA: Diagnosis not present

## 2021-03-15 DIAGNOSIS — E079 Disorder of thyroid, unspecified: Secondary | ICD-10-CM | POA: Diagnosis not present

## 2021-03-15 DIAGNOSIS — I2782 Chronic pulmonary embolism: Secondary | ICD-10-CM | POA: Diagnosis not present

## 2021-03-15 DIAGNOSIS — Z6841 Body Mass Index (BMI) 40.0 and over, adult: Secondary | ICD-10-CM | POA: Diagnosis not present

## 2021-03-16 DIAGNOSIS — U071 COVID-19: Secondary | ICD-10-CM | POA: Diagnosis not present

## 2021-03-16 DIAGNOSIS — G9341 Metabolic encephalopathy: Secondary | ICD-10-CM | POA: Diagnosis not present

## 2021-03-16 DIAGNOSIS — J9312 Secondary spontaneous pneumothorax: Secondary | ICD-10-CM | POA: Diagnosis not present

## 2021-03-16 DIAGNOSIS — J1282 Pneumonia due to coronavirus disease 2019: Secondary | ICD-10-CM | POA: Diagnosis not present

## 2021-03-16 DIAGNOSIS — J9602 Acute respiratory failure with hypercapnia: Secondary | ICD-10-CM | POA: Diagnosis not present

## 2021-03-16 DIAGNOSIS — I2699 Other pulmonary embolism without acute cor pulmonale: Secondary | ICD-10-CM | POA: Diagnosis not present

## 2021-03-16 DIAGNOSIS — I2782 Chronic pulmonary embolism: Secondary | ICD-10-CM | POA: Diagnosis not present

## 2021-03-16 DIAGNOSIS — E875 Hyperkalemia: Secondary | ICD-10-CM | POA: Diagnosis not present

## 2021-03-16 DIAGNOSIS — J9601 Acute respiratory failure with hypoxia: Secondary | ICD-10-CM | POA: Diagnosis not present

## 2021-03-16 DIAGNOSIS — N170 Acute kidney failure with tubular necrosis: Secondary | ICD-10-CM | POA: Diagnosis not present

## 2021-03-16 DIAGNOSIS — I513 Intracardiac thrombosis, not elsewhere classified: Secondary | ICD-10-CM | POA: Diagnosis not present

## 2021-03-16 DIAGNOSIS — E079 Disorder of thyroid, unspecified: Secondary | ICD-10-CM | POA: Diagnosis not present

## 2021-03-17 DIAGNOSIS — J9312 Secondary spontaneous pneumothorax: Secondary | ICD-10-CM | POA: Diagnosis not present

## 2021-03-17 DIAGNOSIS — J9601 Acute respiratory failure with hypoxia: Secondary | ICD-10-CM | POA: Diagnosis not present

## 2021-03-17 DIAGNOSIS — I2782 Chronic pulmonary embolism: Secondary | ICD-10-CM | POA: Diagnosis not present

## 2021-03-17 DIAGNOSIS — G9341 Metabolic encephalopathy: Secondary | ICD-10-CM | POA: Diagnosis not present

## 2021-03-17 DIAGNOSIS — J1282 Pneumonia due to coronavirus disease 2019: Secondary | ICD-10-CM | POA: Diagnosis not present

## 2021-03-17 DIAGNOSIS — E875 Hyperkalemia: Secondary | ICD-10-CM | POA: Diagnosis not present

## 2021-03-17 DIAGNOSIS — E079 Disorder of thyroid, unspecified: Secondary | ICD-10-CM | POA: Diagnosis not present

## 2021-03-17 DIAGNOSIS — I2699 Other pulmonary embolism without acute cor pulmonale: Secondary | ICD-10-CM | POA: Diagnosis not present

## 2021-03-17 DIAGNOSIS — N170 Acute kidney failure with tubular necrosis: Secondary | ICD-10-CM | POA: Diagnosis not present

## 2021-03-17 DIAGNOSIS — I513 Intracardiac thrombosis, not elsewhere classified: Secondary | ICD-10-CM | POA: Diagnosis not present

## 2021-03-17 DIAGNOSIS — U071 COVID-19: Secondary | ICD-10-CM | POA: Diagnosis not present

## 2021-03-18 DIAGNOSIS — I2699 Other pulmonary embolism without acute cor pulmonale: Secondary | ICD-10-CM | POA: Diagnosis not present

## 2021-03-18 DIAGNOSIS — J9312 Secondary spontaneous pneumothorax: Secondary | ICD-10-CM | POA: Diagnosis not present

## 2021-03-18 DIAGNOSIS — E875 Hyperkalemia: Secondary | ICD-10-CM | POA: Diagnosis not present

## 2021-03-18 DIAGNOSIS — G9341 Metabolic encephalopathy: Secondary | ICD-10-CM | POA: Diagnosis not present

## 2021-03-18 DIAGNOSIS — J1282 Pneumonia due to coronavirus disease 2019: Secondary | ICD-10-CM | POA: Diagnosis not present

## 2021-03-18 DIAGNOSIS — N170 Acute kidney failure with tubular necrosis: Secondary | ICD-10-CM | POA: Diagnosis not present

## 2021-03-18 DIAGNOSIS — I513 Intracardiac thrombosis, not elsewhere classified: Secondary | ICD-10-CM | POA: Diagnosis not present

## 2021-03-18 DIAGNOSIS — K9420 Gastrostomy complication, unspecified: Secondary | ICD-10-CM | POA: Diagnosis not present

## 2021-03-18 DIAGNOSIS — E079 Disorder of thyroid, unspecified: Secondary | ICD-10-CM | POA: Diagnosis not present

## 2021-03-18 DIAGNOSIS — U071 COVID-19: Secondary | ICD-10-CM | POA: Diagnosis not present

## 2021-03-18 DIAGNOSIS — R1310 Dysphagia, unspecified: Secondary | ICD-10-CM | POA: Diagnosis not present

## 2021-03-18 DIAGNOSIS — J9601 Acute respiratory failure with hypoxia: Secondary | ICD-10-CM | POA: Diagnosis not present

## 2021-03-18 DIAGNOSIS — I2782 Chronic pulmonary embolism: Secondary | ICD-10-CM | POA: Diagnosis not present

## 2021-04-06 NOTE — Progress Notes (Signed)
[facility]  [address] [place], [place] [address] Phone: [phone]  Fax: [phone]   Clinic Day:  04/07/2021  Referring physician: Ladonna Snide, DO  Chief Complaint: Bianca Michael is a 55 y.o. female with pulmonary emboli s/p COVID who is referred in consultation by Dr. Chip Boer for assessment and management.   HPI:  The patient has a history of morbid obesity and Hashimoto's thyroiditis followed by Graves' disease.  She tested positive for COVID on 01/13/2021 after developing symptoms 2 days prior.  She was unvaccinated. She began Decadron on 01/16/2021.  She presented to the [facility] on 01/21/2021 with respiratory distress due to COVID-19 penumonia. Home oxygen saturations were between 70-80%. She received tocilizumab on 01/22/2021. She was intubated around 01/24/2021.  She was transferred to [facility] from 01/28/2021 - 03/18/2021. She stayed on a ventilator until the beginning of March. Initial chest CT angiogram revealed no pulmonary emboli.  She underwent tracheostomy on 02/10/2021 and PEG placement on 02/16/2021. 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 She required 2 PRBC transfusions.  She was treated with Lovenox for DVT prophylaxis. Chest CT angiogram revealed an acute left lower lobe pulmonary embolus and a new 2.5 cm right ventricular thrombus. There was no CT evidence for elevated right heart pressures. There was worsening severe diffuse groundglass infiltrates consistent with COVID-19 pneumonia. She was treated with onheparin.   She was transferred out of the ICU on 02/26/2021. Heparin was transitioned to Eliquis. She continued to improve and was weaned off of oxygen. PEG was removed on the day of  discharge. She was referred to outpatient PT and OT. She was discharged on Eliquis.  The patient saw Dr. Trixie Rude on 03/22/2021. Hematocrit was 37.1, hemoglobin 12.0, platelets 227,000, WBC 9,200. She was breathing and eating well. She felt weak after discharge. She reported having black and necrotic toes secondary to the pressors received in the hospital.  She is scheduled to see podiatry. She was to continue Eliquis. She was referred to hematology.  Symptomatically, she has been "ok". She lost weight in the hospital but has gained 1 lb since discharge. She has been having headaches and manages them with Tylenol. She reports sinus drainage, runny nose due to allergies, shortness of breath with talking, cough, bladder pain, nausea in the mornings, leg weakness, hand and buttock numbness, and shoulder/elbow/back pain. She had a sharp chest pain this morning. She had "covid toes" but this has improved.  She denies fevers, sweats, changes in vision, sore throat, palpitations, vomiting, diarrhea, reflux, numbness, balance or coordination problems, and bleeding of any kind.  She is taking Eliquis 5 mg BID. Her kidney function is back to normal.  Her mother and sister had breast cancer. Her maternal aunt had ovarian cancer. Her three sisters all had thyroid issues. One of her sisters has lupus.   Past Medical History:  Diagnosis Date  . Anxiety   . Depression   . Hyperlipidemia   . Hypertension   . Pulmonary embolism (HCC)   . Thyroid disease     Past Surgical History:  Procedure Laterality Date  . CARPAL TUNNEL RELEASE    . CHOLECYSTECTOMY    . TUBAL LIGATION      Family History  Problem Relation Age of Onset  . Cancer Mother   . Heart attack Father   .  Heart disease Father   . Cancer Sister   . Thyroid disease Sister   . Thyroid disease Sister     Social History:  reports that she quit smoking about 20 years ago. Her smoking use included cigarettes. She smoked 0.50 packs per day.  She has never used smokeless tobacco. She reports previous alcohol use. She reports previous drug use. She denies any alcohol and tobacco use. She denies any exposure to radiation or toxins. She is married. Her daughter is Bianca Michael. She worked at [employer] but recently moved. She lives in [place]. The patient is accompanied by Bianca Michael today.  Allergies: No Known Allergies  Current Medications: Current Outpatient Medications  Medication Sig Dispense Refill  . apixaban (ELIQUIS) 5 MG TABS tablet Take by mouth.    . Ascorbic Acid (VITAMIN C) 1000 MG tablet Take 1,000 mg by mouth daily.    Marland Kitchen buPROPion (WELLBUTRIN XL) 300 MG 24 hr tablet Take by mouth.    . ergocalciferol (VITAMIN D2) 1.25 MG (50000 UT) capsule Take 50,000 Units by mouth once a week.    . levothyroxine (SYNTHROID) 100 MCG tablet Take by mouth.    . magnesium oxide (MAG-OX) 400 MG tablet Take 400 mg by mouth daily.    . metoprolol tartrate (LOPRESSOR) 25 MG tablet Take by mouth.    . Multiple Vitamins-Minerals (WOMENS MULTI) CAPS Take 1 capsule by mouth daily.    Marland Kitchen omeprazole (PRILOSEC) 40 MG capsule Take 40 mg by mouth daily.    . pravastatin (PRAVACHOL) 10 MG tablet Take by mouth.    . sertraline (ZOLOFT) 25 MG tablet Take by mouth.     No current facility-administered medications for this visit.    Review of Systems  Constitutional: Positive for weight loss (in hospital). Negative for chills, diaphoresis, fever and malaise/fatigue.  HENT: Negative for congestion, ear discharge, ear pain, hearing loss, nosebleeds, sinus pain, sore throat and tinnitus.        Sinus drainage. Runny nose.  Eyes: Negative for blurred vision.  Respiratory: Positive for cough and shortness of breath (with talking and exertion). Negative for hemoptysis and sputum production.   Cardiovascular: Positive for chest pain (one episode this morning). Negative for palpitations and leg swelling.  Gastrointestinal: Positive for nausea (in the mornings).  Negative for abdominal pain, blood in stool, constipation, diarrhea, heartburn, melena and vomiting.  Genitourinary: Negative for dysuria, frequency, hematuria and urgency.       Bladder pain  Musculoskeletal: Positive for back pain and joint pain (elbows and shoulders, arthritis). Negative for myalgias and neck pain.  Skin: Negative for itching and rash.       "COVID toes"  Neurological: Positive for sensory change (hand and buttock numbness) and headaches. Negative for dizziness, tingling and weakness.  Endo/Heme/Allergies: Positive for environmental allergies. Does not bruise/bleed easily.  Psychiatric/Behavioral: Negative for depression and memory loss. The patient is not nervous/anxious and does not have insomnia.   All other systems reviewed and are negative.  Performance status (ECOG): 1-2  Vitals Blood pressure 128/81, pulse 70, temperature 98.5 F (36.9 C), temperature source Tympanic, resp. rate 16, height 5\' 4"  (1.626 m), weight 203 lb 14.8 oz (92.5 kg), SpO2 98 %.   Physical Exam Vitals and nursing note reviewed.  Constitutional:      General: She is not in acute distress.    Appearance: She is not diaphoretic.  HENT:     Head: Normocephalic and atraumatic.     Comments: Shoulder length brown hair.  Mouth/Throat:     Mouth: Mucous membranes are moist.     Pharynx: Oropharynx is clear.  Eyes:     General: No scleral icterus.    Extraocular Movements: Extraocular movements intact.     Conjunctiva/sclera: Conjunctivae normal.     Pupils: Pupils are equal, round, and reactive to light.     Comments: Glasses.  Blue eyes.  Cardiovascular:     Rate and Rhythm: Normal rate and regular rhythm.     Heart sounds: Normal heart sounds. No murmur heard.   Pulmonary:     Effort: Pulmonary effort is normal. No respiratory distress.     Breath sounds: Normal breath sounds. No wheezing or rales.  Chest:     Chest wall: No tenderness.  Breasts:     Right: No axillary  adenopathy or supraclavicular adenopathy.     Left: No axillary adenopathy or supraclavicular adenopathy.    Abdominal:     General: Bowel sounds are normal. There is no distension.     Palpations: Abdomen is soft. There is no mass.     Tenderness: There is no abdominal tenderness. There is no guarding or rebound.  Musculoskeletal:        General: Tenderness (neck, shoulders) present. No swelling. Normal range of motion.     Cervical back: Normal range of motion and neck supple.  Lymphadenopathy:     Head:     Right side of head: No preauricular, posterior auricular or occipital adenopathy.     Left side of head: No preauricular, posterior auricular or occipital adenopathy.     Cervical: No cervical adenopathy.     Upper Body:     Right upper body: No supraclavicular or axillary adenopathy.     Left upper body: No supraclavicular or axillary adenopathy.     Lower Body: No right inguinal adenopathy. No left inguinal adenopathy.  Skin:    General: Skin is warm and dry.     Comments: Well healed scarring from tracheostomy and PEG tube.  See photos of feet.  Neurological:     Mental Status: She is alert and oriented to person, place, and time.  Psychiatric:        Behavior: Behavior normal.        Thought Content: Thought content normal.        Judgment: Judgment normal.       No visits with results within 3 Day(s) from this visit.  Latest known visit with results is:  No results found for any previous visit.    Assessment:  Bianca Michael is a 55 y.o. female with pulmonary emboli associated with COVID-19 pneumonia.  She is on Eliquis.  [facility] - 03/18/2021. She required intubation. Initial chest CT angiogram revealed no pulmonary emboli. Chest CT angiogram revealed an acute left lower lobe pulmonary embolus and a new 2.5 cm RV thrombus. There was no CT evidence for elevated right heart pressures. She was  treated with heparin then transitioned to Eliquis.    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 She developed gangrenous toes secondary to pressors.  Symptomatically, she is doing well.  She continues to have some shortness of breath.  Toes continue to improve.  She denies any bleeding.  Plan: 1.   Labs today:  CBC with diff, CMP, Factor V Leiden, prothrombin gene mutation,  lupus anticoagulant panel, cardiolipin antibodies, beta-2 glycoprotein antibodies. 2.   Bilateral pulmonary emboli  PE occurred in the setting of COVID pneumonia.    Discuss risk factors for thrombosis (pregnancy, obesity, malignancy, dehydration, pelvic/lower extremity surgery, immobility, COVID, clotting deficiencies).  Discuss analogy of a basket floating on the water.  Risk factors for thrombosis are various sized rocks in the basket.   Enough rocks in the basket and the boat sinks.  Sinking represents thrombosis.  Discuss eliminating risk factors that can be controlled.  Discuss initial hypercoagulable work-up and additional testing in 3 months.  Continue Eliquis. 3.   RN:  Eliquis 5 mg BID x 90 days. 4.   RTC in 1 week for MD assessment (video visit) and review of work-up and discussion regarding direction of therapy.  I discussed the assessment and treatment plan with the patient.  The patient was provided an opportunity to ask questions and all were answered.  The patient agreed with the plan and demonstrated an understanding of the instructions.  The patient was advised to call back if the symptoms worsen or if the condition fails to improve as anticipated.  I provided 27 minutes of face-to-face time during this this encounter and > 50% was spent counseling as documented under my assessment and plan. An additional 15 minutes were spent reviewing her  chart (Epic and Care Everywhere) including notes, labs, and imaging studies.    Layonna Dobie C. Merlene Pulling, MD, PhD    04/07/2021, 12:09 PM  I, Danella Penton Tufford, am acting as Neurosurgeon for [employer]. Merlene Pulling, MD, PhD.  I, Edythe Riches C. Merlene Pulling, MD, have reviewed the above documentation for accuracy and completeness, and I agree with the above.

## 2021-04-07 ENCOUNTER — Inpatient Hospital Stay: Payer: 59

## 2021-04-07 ENCOUNTER — Other Ambulatory Visit: Payer: Self-pay

## 2021-04-07 ENCOUNTER — Encounter: Payer: Self-pay | Admitting: Hematology and Oncology

## 2021-04-07 ENCOUNTER — Inpatient Hospital Stay: Payer: 59 | Attending: Hematology and Oncology | Admitting: Hematology and Oncology

## 2021-04-07 ENCOUNTER — Other Ambulatory Visit: Payer: Self-pay | Admitting: Hematology and Oncology

## 2021-04-07 VITALS — BP 128/81 | HR 70 | Temp 98.5°F | Resp 16 | Ht 64.0 in | Wt 203.9 lb

## 2021-04-07 DIAGNOSIS — Z87891 Personal history of nicotine dependence: Secondary | ICD-10-CM | POA: Diagnosis not present

## 2021-04-07 DIAGNOSIS — U099 Post covid-19 condition, unspecified: Secondary | ICD-10-CM | POA: Diagnosis not present

## 2021-04-07 DIAGNOSIS — Z7901 Long term (current) use of anticoagulants: Secondary | ICD-10-CM | POA: Diagnosis not present

## 2021-04-07 DIAGNOSIS — E0500 Thyrotoxicosis with diffuse goiter without thyrotoxic crisis or storm: Secondary | ICD-10-CM | POA: Insufficient documentation

## 2021-04-07 DIAGNOSIS — I2699 Other pulmonary embolism without acute cor pulmonale: Secondary | ICD-10-CM | POA: Diagnosis not present

## 2021-04-07 DIAGNOSIS — E063 Autoimmune thyroiditis: Secondary | ICD-10-CM | POA: Diagnosis not present

## 2021-04-07 DIAGNOSIS — Z803 Family history of malignant neoplasm of breast: Secondary | ICD-10-CM | POA: Insufficient documentation

## 2021-04-07 DIAGNOSIS — Z8041 Family history of malignant neoplasm of ovary: Secondary | ICD-10-CM | POA: Diagnosis not present

## 2021-04-07 LAB — CBC WITH DIFFERENTIAL/PLATELET
Abs Immature Granulocytes: 0.02 10*3/uL (ref 0.00–0.07)
Basophils Absolute: 0.1 10*3/uL (ref 0.0–0.1)
Basophils Relative: 2 %
Eosinophils Absolute: 0.3 10*3/uL (ref 0.0–0.5)
Eosinophils Relative: 4 %
HCT: 38.2 % (ref 36.0–46.0)
Hemoglobin: 12.3 g/dL (ref 12.0–15.0)
Immature Granulocytes: 0 %
Lymphocytes Relative: 32 %
Lymphs Abs: 2.2 10*3/uL (ref 0.7–4.0)
MCH: 26.9 pg (ref 26.0–34.0)
MCHC: 32.2 g/dL (ref 30.0–36.0)
MCV: 83.4 fL (ref 80.0–100.0)
Monocytes Absolute: 0.5 10*3/uL (ref 0.1–1.0)
Monocytes Relative: 8 %
Neutro Abs: 3.7 10*3/uL (ref 1.7–7.7)
Neutrophils Relative %: 54 %
Platelets: 316 10*3/uL (ref 150–400)
RBC: 4.58 MIL/uL (ref 3.87–5.11)
RDW: 14.7 % (ref 11.5–15.5)
WBC: 6.9 10*3/uL (ref 4.0–10.5)
nRBC: 0 % (ref 0.0–0.2)

## 2021-04-07 LAB — COMPREHENSIVE METABOLIC PANEL
ALT: 23 U/L (ref 0–44)
AST: 20 U/L (ref 15–41)
Albumin: 3.6 g/dL (ref 3.5–5.0)
Alkaline Phosphatase: 83 U/L (ref 38–126)
Anion gap: 7 (ref 5–15)
BUN: 7 mg/dL (ref 6–20)
CO2: 25 mmol/L (ref 22–32)
Calcium: 9.2 mg/dL (ref 8.9–10.3)
Chloride: 104 mmol/L (ref 98–111)
Creatinine, Ser: 0.74 mg/dL (ref 0.44–1.00)
GFR, Estimated: >60 mL/min (ref >60)
Glucose, Bld: 108 mg/dL — ABNORMAL HIGH (ref 70–99)
Potassium: 3.6 mmol/L (ref 3.5–5.1)
Sodium: 136 mmol/L (ref 135–145)
Total Bilirubin: 0.5 mg/dL (ref 0.3–1.2)
Total Protein: 7.5 g/dL (ref 6.5–8.1)

## 2021-04-07 MED ORDER — APIXABAN 5 MG PO TABS: 5.0000 mg | ORAL_TABLET | Freq: Two times a day (BID) | ORAL | 0 refills | Status: DC

## 2021-04-08 ENCOUNTER — Other Ambulatory Visit: Payer: Self-pay | Admitting: Hematology and Oncology

## 2021-04-08 LAB — CARDIOLIPIN ANTIBODIES, IGG, IGM, IGA
Anticardiolipin IgA: <9 APL U/mL (ref 0–11)
Anticardiolipin IgG: <9 GPL U/mL (ref 0–14)
Anticardiolipin IgM: <9 MPL U/mL (ref 0–12)

## 2021-04-08 LAB — LUPUS ANTICOAGULANT PANEL
DRVVT: 74.9 s — ABNORMAL HIGH (ref 0.0–47.0)
PTT Lupus Anticoagulant: 35.6 s (ref 0.0–51.9)

## 2021-04-08 LAB — DRVVT CONFIRM: dRVVT Confirm: 1.2 ratio (ref 0.8–1.2)

## 2021-04-08 LAB — DRVVT MIX: dRVVT Mix: 49.7 s — ABNORMAL HIGH (ref 0.0–40.4)

## 2021-04-09 MED ORDER — RIVAROXABAN 20 MG PO TABS: 20.0000 mg | ORAL_TABLET | Freq: Every day | ORAL | 1 refills | Status: DC

## 2021-04-09 NOTE — Telephone Encounter (Signed)
Spoke with patient. Insurance will not cover eliquis anymore and prefers to cover xarelto. Per Dr. Merlene Pulling, ok send in Xarelto 20 mg daily. Pt is aware and ok with the change.

## 2021-04-12 LAB — BETA-2-GLYCOPROTEIN I ABS, IGG/M/A
Beta-2 Glyco I IgG: 60 GPI IgG units — ABNORMAL HIGH (ref 0–20)
Beta-2-Glycoprotein I IgA: <9 GPI IgA units (ref 0–25)
Beta-2-Glycoprotein I IgM: <9 GPI IgM units (ref 0–32)

## 2021-04-13 NOTE — Progress Notes (Signed)
[facility]  [address] [place], [place] [address] Phone: [phone]  Fax: [phone]   Telemedicine Office Visit:  04/14/2021  Referring physician: Ladonna Snide, DO  I connected with [name] Mckeon on 04/14/2021 at 4:50 PM by videoconferencing and verified that I was speaking with the correct person using 2 identifiers.  The patient was at home.  I discussed the limitations, risk, security and privacy concerns of performing an evaluation and management service by videoconferencing and the availability of in person appointments.  I also discussed with the patient that there may be a patient responsible charge related to this service.  The patient expressed understanding and agreed to proceed.   Chief Complaint: Pamalee Marcoe is a 55 y.o. female with pulmonary emboli s/p COVID who is seen for 1 week assessment, review of work-up, and discussion regarding direction of therapy.  HPI:  The patient was last seen in the hematology clinic on 04/07/2021 for new patient assessment. At that time, she was doing well.  She continued to have some shortness of breath.  Toes continued to improve.  She denied any bleeding. She continued Eliquis.  Work-up on 04/07/2021 revealed a hematocrit of 38.2, hemoglobin 12.3, platelets 316,000, WBC 6,900 with an ANC of 3700. CMP was normal. Lupus anticoagulant panel was negative.  Beta-2 glycoprotein IgG was 60 (0-20), IgM < 9, and IgA < 9. Cardiolipin antibodies were normal. Factor 5 Leiden and prothrombin gene mutation are pending.  During the interim, she has been fine. She has enough Eliquis to finish this week, but her insurance will not approve another prescription. She will be starting Xarelto next week.  Her weight, shortness of breath, finger and buttock numbness, headaches, bladder pain, back pain, and joint pain are stable. Her sinus drainage is stable and she has developed a cough and sore throat from it.  She had pain in the middle of her chest last night, which lasted for 30-45 minutes. Her nausea has resolved. She denies bruising or bleeding of any kind.    Past Medical History:  Diagnosis Date  . Anxiety   . Depression   . Hyperlipidemia   . Hypertension   . Pulmonary embolism (HCC)   . Thyroid disease     Past Surgical History:  Procedure Laterality Date  . CARPAL TUNNEL RELEASE    . CHOLECYSTECTOMY    . TUBAL LIGATION      Family History  Problem Relation Age of Onset  . Cancer Mother   . Heart attack Father   . Heart disease Father   . Cancer Sister   . Thyroid disease Sister   . Thyroid disease Sister     Social History:  reports that she quit smoking about 20 years ago. Her smoking use included cigarettes. She smoked 0.50 packs per day. She has never used smokeless tobacco. She reports previous alcohol use. She reports previous drug use. She denies any alcohol and tobacco use. She denies any exposure to radiation or toxins. She is married. She worked at [employer] but recently moved. She lives in [place]. The patient is accompanied by her daughter by phone today.  Participants in the patient's visit and their role in the encounter included the patient and Laurie Panda, RN today.  The intake visit was provided Laurie Panda, Charity fundraiser.  Allergies: No Known Allergies  Current Medications: Current Outpatient Medications  Medication Sig Dispense Refill  . Ascorbic Acid (VITAMIN C) 1000 MG tablet Take 1,000 mg by  mouth daily.    Marland Kitchen buPROPion (WELLBUTRIN XL) 300 MG 24 hr tablet Take by mouth.    Everlene Balls 5 MG TABS tablet TAKE 1 TABLET BY MOUTH TWICE A DAY 60 tablet 1  . ergocalciferol (VITAMIN D2) 1.25 MG (50000 UT) capsule Take 50,000 Units by mouth once a week.    . levothyroxine (SYNTHROID) 100 MCG tablet Take by mouth.    . magnesium oxide (MAG-OX) 400 MG tablet Take 400 mg by mouth daily.    . metoprolol tartrate (LOPRESSOR) 25 MG tablet Take by mouth.    . Multiple  Vitamins-Minerals (WOMENS MULTI) CAPS Take 1 capsule by mouth daily.    Marland Kitchen omeprazole (PRILOSEC) 40 MG capsule Take 40 mg by mouth daily.    . pravastatin (PRAVACHOL) 10 MG tablet Take by mouth.    . rivaroxaban (XARELTO) 20 MG TABS tablet Take 1 tablet (20 mg total) by mouth daily with supper. 30 tablet 1  . sertraline (ZOLOFT) 25 MG tablet Take by mouth.     No current facility-administered medications for this visit.    Review of Systems  Constitutional: Negative for chills, diaphoresis, fever, malaise/fatigue and weight loss (stable per patient).  HENT: Positive for sore throat. Negative for congestion, ear discharge, ear pain, hearing loss, nosebleeds, sinus pain and tinnitus.        Sinus drainage. Runny nose.  Eyes: Negative for blurred vision.  Respiratory: Positive for cough and shortness of breath (with talking and exertion). Negative for hemoptysis and sputum production.   Cardiovascular: Positive for chest pain (one episode this morning). Negative for palpitations and leg swelling.  Gastrointestinal: Negative for abdominal pain, blood in stool, constipation, diarrhea, heartburn, melena, nausea and vomiting.  Genitourinary: Negative for dysuria, frequency, hematuria and urgency.       Bladder pain  Musculoskeletal: Positive for back pain and joint pain (elbows and shoulders, arthritis). Negative for myalgias and neck pain.  Skin: Negative for itching and rash.  Neurological: Positive for sensory change (hand and buttock numbness) and headaches (everyday). Negative for dizziness, tingling and weakness.  Endo/Heme/Allergies: Positive for environmental allergies. Does not bruise/bleed easily.  Psychiatric/Behavioral: Negative for depression and memory loss. The patient is not nervous/anxious and does not have insomnia.   All other systems reviewed and are negative.  Performance status (ECOG): 1-2  Vitals There were no vitals taken for this visit.   Physical Exam Vitals and  nursing note reviewed.  Constitutional:      General: She is not in acute distress.    Appearance: She is not diaphoretic.  Eyes:     General: No scleral icterus.    Conjunctiva/sclera: Conjunctivae normal.  Neurological:     Mental Status: She is alert and oriented to person, place, and time.  Psychiatric:        Behavior: Behavior normal.        Thought Content: Thought content normal.        Judgment: Judgment normal.    04/07/2021    No visits with results within 3 Day(s) from this visit.  Latest known visit with results is:  Clinical Support on 04/07/2021  Component Date Value Ref Range Status  . Beta-2 Glyco I IgG 04/07/2021 60* 0 - 20 GPI IgG units Final   Comment: (NOTE) The reference interval reflects a 3SD or 99th percentile interval, which is thought to represent a potentially clinically significant result in accordance with the International Consensus Statement on the classification criteria for definitive antiphospholipid syndrome (APS). Terese Door  Haem 2006;4:295-306.   . Beta-2-Glycoprotein I IgM 04/07/2021 <9  0 - 32 GPI IgM units Final   Comment: (NOTE) The reference interval reflects a 3SD or 99th percentile interval, which is thought to represent a potentially clinically significant result in accordance with the International Consensus Statement on the classification criteria for definitive antiphospholipid syndrome (APS). J Thromb Haem 2006;4:295-306. Performed At: [facility] [address] [place], [place] [id number] Jolene SchimkeNagendra Sanjai MD WU:[phone]   . Beta-2-Glycoprotein I IgA 04/07/2021 <9  0 - 25 GPI IgA units Final   Comment: (NOTE) The reference interval reflects a 3SD or 99th percentile interval, which is thought to represent a potentially clinically significant result in accordance with the International Consensus Statement on the classification criteria for definitive antiphospholipid syndrome (APS). J Thromb Haem 2006;4:295-306.    Marland Kitchen. Anticardiolipin IgG 04/07/2021 <9  0 - 14 GPL U/mL Final   Comment: (NOTE)                          Negative:              <15                          Indeterminate:     15 - 20                          Low-Med Positive: >20 - 80                          High Positive:         >80   . Anticardiolipin IgM 04/07/2021 <9  0 - 12 MPL U/mL Final   Comment: (NOTE)                          Negative:              <13                          Indeterminate:     13 - 20                          Low-Med Positive: >20 - 80                          High Positive:         >80   . Anticardiolipin IgA 04/07/2021 <9  0 - 11 APL U/mL Final   Comment: (NOTE)                          Negative:              <12                          Indeterminate:     12 - 20                          Low-Med Positive: >20 - 80                          High Positive:         >  80 Performed At: [facility] [address] [place], [place] [id number] Jolene Schimke MD [id number]   . PTT Lupus Anticoagulant 04/07/2021 35.6  0.0 - 51.9 sec Final  . DRVVT 04/07/2021 74.9* 0.0 - 47.0 sec Final  . Lupus Anticoag Interp 04/07/2021 Comment:   Corrected   Comment: (NOTE) No lupus anticoagulant was detected. These results are consistent with specific inhibitors to one or more common pathway factors (X, V, II or fibrinogen). Performed At: [facility] [address] [place], [place] [id number] Jolene Schimke MD [id number]   . Sodium 04/07/2021 136  135 - 145 mmol/L Final  . Potassium 04/07/2021 3.6  3.5 - 5.1 mmol/L Final  . Chloride 04/07/2021 104  98 - 111 mmol/L Final  . CO2 04/07/2021 25  22 - 32 mmol/L Final  . Glucose, Bld 04/07/2021 108* 70 - 99 mg/dL Final   Glucose reference range applies only to samples taken after fasting for at least 8 hours.  . BUN 04/07/2021 7  6 - 20 mg/dL Final  . Creatinine, Ser 04/07/2021 0.74  0.44 - 1.00 mg/dL Final  . Calcium 27/25/3664 9.2  8.9 - 10.3 mg/dL  Final  . Total Protein 04/07/2021 7.5  6.5 - 8.1 g/dL Final  . Albumin 40/34/7425 3.6  3.5 - 5.0 g/dL Final  . AST 95/63/8756 20  15 - 41 U/L Final  . ALT 04/07/2021 23  0 - 44 U/L Final  . Alkaline Phosphatase 04/07/2021 83  38 - 126 U/L Final  . Total Bilirubin 04/07/2021 0.5  0.3 - 1.2 mg/dL Final  . GFR, Estimated 04/07/2021 >60  >60 mL/min Final   Comment: (NOTE) Calculated using the CKD-EPI Creatinine Equation (2021)   . Anion gap 04/07/2021 7  5 - 15 Final   Performed at [facility], [address] [place], [place] [address]  . WBC 04/07/2021 6.9  4.0 - 10.5 K/uL Final  . RBC 04/07/2021 4.58  3.87 - 5.11 MIL/uL Final  . Hemoglobin 04/07/2021 12.3  12.0 - 15.0 g/dL Final  . HCT 51/88/4166 38.2  36.0 - 46.0 % Final  . MCV 04/07/2021 83.4  80.0 - 100.0 fL Final  . MCH 04/07/2021 26.9  26.0 - 34.0 pg Final  . MCHC 04/07/2021 32.2  30.0 - 36.0 g/dL Final  . RDW 06/24/1600 14.7  11.5 - 15.5 % Final  . Platelets 04/07/2021 316  150 - 400 K/uL Final  . nRBC 04/07/2021 0.0  0.0 - 0.2 % Final  . Neutrophils Relative % 04/07/2021 54  % Final  . Neutro Abs 04/07/2021 3.7  1.7 - 7.7 K/uL Final  . Lymphocytes Relative 04/07/2021 32  % Final  . Lymphs Abs 04/07/2021 2.2  0.7 - 4.0 K/uL Final  . Monocytes Relative 04/07/2021 8  % Final  . Monocytes Absolute 04/07/2021 0.5  0.1 - 1.0 K/uL Final  . Eosinophils Relative 04/07/2021 4  % Final  . Eosinophils Absolute 04/07/2021 0.3  0.0 - 0.5 K/uL Final  . Basophils Relative 04/07/2021 2  % Final  . Basophils Absolute 04/07/2021 0.1  0.0 - 0.1 K/uL Final  . Immature Granulocytes 04/07/2021 0  % Final  . Abs Immature Granulocytes 04/07/2021 0.02  0.00 - 0.07 K/uL Final   Performed at [facility], [address] [place], [place] [address]  . dRVVT Mix 04/07/2021 49.7* 0.0 - 40.4 sec Final   Comment: (NOTE) Performed At: [facility] [address] [place], [place] [id number] Jolene Schimke MD  [id number]   . dRVVT Confirm 04/07/2021 1.2  0.8 - 1.2 ratio Final   Comment: (NOTE) Performed At: [facility] [address] [place], [place] [id number] Jolene Schimke MD [id number]     Assessment:  Schylar Allard is a 55 y.o. female with pulmonary emboli associated with COVID-19 pneumonia.  She is on Eliquis.  She was admitted to [facility] from 01/28/2021 - 03/18/2021. She required intubation. Initial chest CT angiogram revealed no pulmonary emboli. Chest CT angiogram revealed an acute left lower lobe pulmonary embolus and a new 2.5 cm RV thrombus. There was no CT evidence for elevated right heart pressures. She was treated with heparin then transitioned to Eliquis.   Work-up on 04/07/2021 revealed a hematocrit of 38.2, hemoglobin 12.3, platelets 316,000, WBC 6,900 with an ANC of 3700. CMP was normal. Lupus anticoagulant panel was negative.  Beta-2 glycoprotein IgG was 60 (0-20), IgM < 9, and IgA < 9. Cardiolipin antibodies were normal. Factor 5 Leiden and prothrombin gene mutation are normal today.  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 She developed gangrenous toes secondary to pressors.  Symptomatically, she feels fine. Her insurance requires a switch from Eliquis to Xarelto.  She denies any bleeding.  Plan: 1.   Review work-up. 2.   Bilateral pulmonary emboli  PE occurred in the setting of COVID pneumonia.    Risk factors for thrombosis (malignancy, dehydration, pelvic/lower extremity surgery, immobility, COVID, clotting deficiencies).  Review work-up to date:   Factor V Leiden and prothrombin gene mutations are pending.   Beta-2 glycoprotein IgG elevated (etiology likely COVID).  Factor V Leiden and prothrombin gene mutation are negative  (back after clinic).  Discuss plan to switch from Eliquis to Zarelto per insurance. 3.   RN: Call patient with Factor V Leiden and prothrombin gene mutation results. 4.   RTC in 3 months for MD assessment, labs (beta2-glycoprotein antibodies, protein C/protein S/ATIII antigen and activity levels- drawn the week prior), and review of testing.   Addendum:  Factor V Leiden and prothrombin gene mutation were normal.  I discussed the assessment and treatment plan with the patient.  The patient was provided an opportunity to ask questions and all were answered.  The patient agreed with the plan and demonstrated an understanding of the instructions.  The patient was advised to call back if the symptoms worsen or if the condition fails to improve as anticipated.  I provided 28 minutes (4:22 PM - 4:50 PM) of face-to-face video visit time during this this encounter and > 50% was spent counseling as documented under my assessment and plan.  An additional 5 minutes were spent reviewing her chart (Epic and Care Everywhere) including notes, labs, and imaging studies.  I provided these services from the [facility] office.   Claron Rosencrans C. Merlene Pulling, MD, PhD    04/14/2021, 4:50 PM  I, Danella Penton Tufford, am acting as Neurosurgeon for [employer]. Merlene Pulling, MD, PhD.  I, Diquan Kassis C. Merlene Pulling, MD, have reviewed the above documentation for accuracy and completeness, and I agree with the above.

## 2021-04-14 ENCOUNTER — Encounter: Payer: Self-pay | Admitting: Hematology and Oncology

## 2021-04-14 ENCOUNTER — Inpatient Hospital Stay (HOSPITAL_BASED_OUTPATIENT_CLINIC_OR_DEPARTMENT_OTHER): Payer: 59 | Admitting: Hematology and Oncology

## 2021-04-14 DIAGNOSIS — I2699 Other pulmonary embolism without acute cor pulmonale: Secondary | ICD-10-CM

## 2021-04-14 LAB — FACTOR 5 LEIDEN

## 2021-04-14 LAB — PROTHROMBIN GENE MUTATION

## 2021-04-14 NOTE — Progress Notes (Signed)
Will be starting xarelto instead of eliquis (due to insurance). Wants to know if she can take ibuprofen with it?

## 2021-04-19 ENCOUNTER — Ambulatory Visit (INDEPENDENT_AMBULATORY_CARE_PROVIDER_SITE_OTHER): Payer: 59

## 2021-04-19 ENCOUNTER — Ambulatory Visit
Admission: EM | Admit: 2021-04-19 | Discharge: 2021-04-19 | Disposition: A | Discharge status: Home / Self Care | Payer: 59 | Attending: Emergency Medicine | Admitting: Emergency Medicine

## 2021-04-19 ENCOUNTER — Other Ambulatory Visit: Payer: Self-pay

## 2021-04-19 DIAGNOSIS — J019 Acute sinusitis, unspecified: Secondary | ICD-10-CM | POA: Diagnosis not present

## 2021-04-19 DIAGNOSIS — Z8616 Personal history of COVID-19: Secondary | ICD-10-CM

## 2021-04-19 DIAGNOSIS — R059 Cough, unspecified: Secondary | ICD-10-CM

## 2021-04-19 DIAGNOSIS — R0789 Other chest pain: Secondary | ICD-10-CM

## 2021-04-19 DIAGNOSIS — R238 Other skin changes: Secondary | ICD-10-CM | POA: Diagnosis not present

## 2021-04-19 DIAGNOSIS — J209 Acute bronchitis, unspecified: Secondary | ICD-10-CM | POA: Diagnosis not present

## 2021-04-19 DIAGNOSIS — U071 COVID-19: Secondary | ICD-10-CM | POA: Diagnosis not present

## 2021-04-19 DIAGNOSIS — S90222D Contusion of left lesser toe(s) with damage to nail, subsequent encounter: Secondary | ICD-10-CM | POA: Diagnosis not present

## 2021-04-19 MED ORDER — AMOXICILLIN-POT CLAVULANATE 875-125 MG PO TABS: 1.0000 | ORAL_TABLET | Freq: Two times a day (BID) | ORAL | 0 refills | Status: AC

## 2021-04-19 MED ORDER — GUAIFENESIN-CODEINE 100-10 MG/5ML PO SYRP: 5.0000 mL | ORAL_SOLUTION | Freq: Four times a day (QID) | ORAL | 0 refills | Status: DC | PRN

## 2021-04-19 NOTE — ED Triage Notes (Signed)
Pt c/o cough with mucus, chest discomfort. Started 04/12/2021.

## 2021-04-19 NOTE — Discharge Instructions (Signed)
Your chest x-ray does not show pneumonia.  It is possible you have a sinus infection and bronchitis.  Due to your history we can send an antibiotic.  I have sent Augmentin.  I have also sent Cheratussin.  Do not take Mucinex with this.  Make sure you are increasing rest and fluid intake.  Continue with your at home inhalers.  Follow-up with your PCP and specialist as scheduled.  Call 911 or go to the emergency department for any high fevers you cannot control, chest pain or increased breathing difficulty or weakness.

## 2021-04-19 NOTE — ED Provider Notes (Signed)
[facility]    CSN: [id number] Arrival date & time: 04/19/21  1703      History   Chief Complaint Chief Complaint  Patient presents with  . Cough    HPI Bianca Michael is a 55 y.o. female presenting for approximately 1 week history of cough and congestion.  Patient states that it started with nasal congestion and she continues to have nasal congestion and sinus pressure.  She says that over the past couple days she felt like the infection is moved into her chest.  She admits to productive cough.  She has yellowish-green mucus.  She says that she is also having some increased shortness of breath and has to use her albuterol inhaler couple times a day.  Patient has personal history of COVID-19, pneumonia due to COVID-19, and pulmonary embolism.  Patient was hospitalized in February 2022 for this.  She also suffered multiple other complications including renal failure, peripheral vascular disease, and hypercoagulability.  She is currently awaiting a referral to pulmonology.  Referral was placed about 3 days ago.  She had her last appointment with her primary care provider 3 days ago.  She is currently taking Eliquis.   Patient also uses albuterol inhaler as needed for shortness of breath.  Patient denies any fevers.  She has been fatigued.  Her daughter says that she seems like she is feeling very badly.  Patient has been taking over-the-counter Mucinex and Robitussin.  She denies any other symptoms or complaints.  HPI  Past Medical History:  Diagnosis Date  . Anxiety   . Depression   . Hyperlipidemia   . Hypertension   . Pulmonary embolism (HCC)   . Thyroid disease     Patient Active Problem List   Diagnosis Date Noted  . Acute pulmonary embolism without acute cor pulmonale (HCC) 04/07/2021    Past Surgical History:  Procedure Laterality Date  . CARPAL TUNNEL RELEASE    . CHOLECYSTECTOMY    . TUBAL LIGATION      OB History   No obstetric history on  file.      Home Medications    Prior to Admission medications   Medication Sig Start Date End Date Taking? Authorizing Provider  amoxicillin-clavulanate (AUGMENTIN) 875-125 MG tablet Take 1 tablet by mouth every 12 (twelve) hours for 7 days. 04/19/21 04/26/21 Yes Shirlee LatchEaves, Belkys Henault B, PA-C  Ascorbic Acid (VITAMIN C) 1000 MG tablet Take 1,000 mg by mouth daily.   Yes [provider]  buPROPion (WELLBUTRIN XL) 300 MG 24 hr tablet Take by mouth. 03/19/18  Yes [provider]  ELIQUIS 5 MG TABS tablet TAKE 1 TABLET BY MOUTH TWICE A DAY 04/08/21  Yes Corcoran, Melissa C, MD  ergocalciferol (VITAMIN D2) 1.25 MG (50000 UT) capsule Take 50,000 Units by mouth once a week.   Yes [provider]  guaiFENesin-codeine (ROBITUSSIN AC) 100-10 MG/5ML syrup Take 5 mLs by mouth 4 (four) times daily as needed for cough. 04/19/21  Yes Shirlee LatchEaves, Dequan Kindred B, PA-C  levothyroxine (SYNTHROID) 100 MCG tablet Take by mouth. 03/19/21  Yes [provider]  magnesium oxide (MAG-OX) 400 MG tablet Take 400 mg by mouth daily.   Yes [provider]  metoprolol tartrate (LOPRESSOR) 25 MG tablet Take by mouth. 03/18/21  Yes [provider]  Multiple Vitamins-Minerals (WOMENS MULTI) CAPS Take 1 capsule by mouth daily.   Yes [provider]  omeprazole (PRILOSEC) 40 MG capsule Take 40 mg by mouth daily.   Yes [provider]  pravastatin (PRAVACHOL) 10 MG tablet Take by mouth. 03/18/21  Yes [provider]  rivaroxaban (XARELTO) 20 MG TABS tablet Take 1 tablet (20 mg total) by mouth daily with supper. 04/09/21  Yes Rosey Bath, MD  sertraline (ZOLOFT) 25 MG tablet Take by mouth. 03/18/21  Yes [provider]    Family History Family History  Problem Relation Age of Onset  . Cancer Mother   . Heart attack Father   . Heart disease Father   . Cancer Sister   . Thyroid disease Sister   . Thyroid disease Sister     Social History Social History    Tobacco Use  . Smoking status: Former Smoker    Packs/day: 0.50    Types: Cigarettes    Quit date: 12/26/2000    Years since quitting: 20.3  . Smokeless tobacco: Never Used  Substance Use Topics  . Alcohol use: Not Currently  . Drug use: Not Currently     Allergies   Patient has no known allergies.   Review of Systems Review of Systems  Constitutional: Positive for fatigue. Negative for chills, diaphoresis and fever.  HENT: Positive for congestion, rhinorrhea, sinus pressure and sinus pain. Negative for ear pain and sore throat.   Respiratory: Positive for cough and shortness of breath. Negative for chest tightness and wheezing.   Cardiovascular: Positive for chest pain.  Gastrointestinal: Negative for abdominal pain, nausea and vomiting.  Musculoskeletal: Negative for arthralgias and myalgias.  Skin: Negative for rash.  Neurological: Positive for headaches. Negative for weakness.  Hematological: Negative for adenopathy.     Physical Exam Triage Vital Signs ED Triage Vitals  Enc Vitals Group     BP 04/19/21 1718 (!) 153/89     Pulse Rate 04/19/21 1718 82     Resp 04/19/21 1718 18     Temp 04/19/21 1718 98.3 F (36.8 C)     Temp Source 04/19/21 1718 Oral     SpO2 04/19/21 1718 97 %     Weight 04/19/21 1717 203 lb (92.1 kg)     Height 04/19/21 1717 5\' 4"  (1.626 m)     Head Circumference --      Peak Flow --      Pain Score --      Pain Loc --      Pain Edu? --      Excl. in GC? --    No data found.  Updated Vital Signs BP (!) 153/89 (BP Location: Right Arm)   Pulse 82   Temp 98.3 F (36.8 C) (Oral)   Resp 18   Ht 5\' 4"  (1.626 m)   Wt 203 lb (92.1 kg)   SpO2 97%   BMI 34.84 kg/m    Physical Exam Vitals and nursing note reviewed.  Constitutional:      General: She is not in acute distress.    Appearance: Normal appearance. She is not ill-appearing or toxic-appearing.  HENT:     Head: Normocephalic and atraumatic.     Nose: Congestion and  rhinorrhea present.     Mouth/Throat:     Mouth: Mucous membranes are moist.     Pharynx: Oropharynx is clear.  Eyes:     General: No scleral icterus.       Right eye: No discharge.        Left eye: No discharge.     Conjunctiva/sclera: Conjunctivae normal.  Cardiovascular:     Rate and Rhythm: Normal rate and regular rhythm.  Heart sounds: Normal heart sounds.  Pulmonary:     Effort: Pulmonary effort is normal. No respiratory distress.     Breath sounds: Normal breath sounds. No wheezing, rhonchi or rales.  Musculoskeletal:     Cervical back: Neck supple.  Skin:    General: Skin is dry.  Neurological:     General: No focal deficit present.     Mental Status: She is alert. Mental status is at baseline.     Motor: No weakness.     Gait: Gait normal.  Psychiatric:        Mood and Affect: Mood normal.        Behavior: Behavior normal.        Thought Content: Thought content normal.      UC Treatments / Results  Labs (all labs ordered are listed, but only abnormal results are displayed) Labs Reviewed - No data to display  EKG   Radiology DG Chest 2 View  Result Date: 04/19/2021 CLINICAL DATA:  Cough and chest discomfort. History of COVID earlier this year with tracheostomy and chest tube. EXAM: CHEST - 2 VIEW COMPARISON:  No prior exams available for comparison. FINDINGS: The heart is normal in size. Normal mediastinal contours there is mild bronchial thickening. Streaky opacity at the left lung base. No confluent consolidation. Mild biapical pleuroparenchymal scarring. No pulmonary edema, pleural effusion, or pneumothorax. No acute osseous abnormalities are seen. IMPRESSION: Mild bronchial thickening. Streaky opacity at the left lung base, may be atelectasis or scarring. No confluent consolidation. Electronically Signed   By: Narda Rutherford M.D.   On: 04/19/2021 18:39    Procedures Procedures (including critical care time)  Medications Ordered in UC Medications -  No data to display  Initial Impression / Assessment and Plan / UC Course  I have reviewed the triage vital signs and the nursing notes.  Pertinent labs & imaging results that were available during my care of the patient were reviewed by me and considered in my medical decision making (see chart for details).   55 y/o female presenting for 1 week history of productive cough, congestion, and increased SOB.  Patient does have complicated medical history of COVID-19 2 months ago with severe complications from that including continued shortness of breath and cough.  She ended up yet a pulmonary embolism on, hypercoagulability, COVID toes, renal failure, and COVID-pneumonia at that time.  Patient is still recovering.  She does have an upcoming appointment with a pulmonologist.  Her vital signs are all stable today.  She is afebrile and her oxygen is 97%.  Exam significant for nasal congestion with thick yellow nasal drainage and sinus tenderness.  Her chest is clear to auscultation heart regular rate and rhythm.  Chest x-ray obtained today and independently viewed by me.  Chest x-ray within normal limits.  Reviewed this result with patient and her daughter.  Patient concerned about getting pneumonia again.  She does request an antibiotic.  Advised her this is likely a viral illness but given her complicated medical history we can try an antibiotic to cover her for a sinus infection.  Sent Augmentin to pharmacy.  Have also sent Cheratussin after reviewing the controlled substance database.  Advised her to continue with her at home inhalers.  Advised to continue following up with her PCP and specialist.  Reviewed ED red flag signs and symptoms with patient and daughter.   Final Clinical Impressions(s) / UC Diagnoses   Final diagnoses:  Acute sinusitis, recurrence not specified, unspecified location  Acute bronchitis, unspecified organism  Cough  Personal history of COVID-19     Discharge  Instructions     Your chest x-ray does not show pneumonia.  It is possible you have a sinus infection and bronchitis.  Due to your history we can send an antibiotic.  I have sent Augmentin.  I have also sent Cheratussin.  Do not take Mucinex with this.  Make sure you are increasing rest and fluid intake.  Continue with your at home inhalers.  Follow-up with your PCP and specialist as scheduled.  Call 911 or go to the emergency department for any high fevers you cannot control, chest pain or increased breathing difficulty or weakness.    ED Prescriptions    Medication Sig Dispense Auth. Provider   amoxicillin-clavulanate (AUGMENTIN) 875-125 MG tablet Take 1 tablet by mouth every 12 (twelve) hours for 7 days. 14 tablet Shirlee Latch, PA-C   guaiFENesin-codeine (ROBITUSSIN AC) 100-10 MG/5ML syrup Take 5 mLs by mouth 4 (four) times daily as needed for cough. 120 mL Shirlee Latch, PA-C     PDMP not reviewed this encounter.   Shirlee Latch, PA-C 04/20/21 [phone]

## 2021-04-20 DIAGNOSIS — M542 Cervicalgia: Secondary | ICD-10-CM | POA: Diagnosis not present

## 2021-04-20 DIAGNOSIS — R293 Abnormal posture: Secondary | ICD-10-CM | POA: Diagnosis not present

## 2021-04-20 DIAGNOSIS — M5459 Other low back pain: Secondary | ICD-10-CM | POA: Diagnosis not present

## 2021-04-20 DIAGNOSIS — U099 Post covid-19 condition, unspecified: Secondary | ICD-10-CM | POA: Diagnosis not present

## 2021-04-20 DIAGNOSIS — Z8616 Personal history of COVID-19: Secondary | ICD-10-CM | POA: Diagnosis not present

## 2021-04-20 DIAGNOSIS — R262 Difficulty in walking, not elsewhere classified: Secondary | ICD-10-CM | POA: Diagnosis not present

## 2021-04-22 DIAGNOSIS — U099 Post covid-19 condition, unspecified: Secondary | ICD-10-CM | POA: Diagnosis not present

## 2021-04-22 DIAGNOSIS — R262 Difficulty in walking, not elsewhere classified: Secondary | ICD-10-CM | POA: Diagnosis not present

## 2021-04-22 DIAGNOSIS — M542 Cervicalgia: Secondary | ICD-10-CM | POA: Diagnosis not present

## 2021-04-22 DIAGNOSIS — M5459 Other low back pain: Secondary | ICD-10-CM | POA: Diagnosis not present

## 2021-04-22 DIAGNOSIS — R293 Abnormal posture: Secondary | ICD-10-CM | POA: Diagnosis not present

## 2021-04-22 DIAGNOSIS — Z8616 Personal history of COVID-19: Secondary | ICD-10-CM | POA: Diagnosis not present

## 2021-04-26 DIAGNOSIS — M542 Cervicalgia: Secondary | ICD-10-CM | POA: Diagnosis not present

## 2021-04-26 DIAGNOSIS — R293 Abnormal posture: Secondary | ICD-10-CM | POA: Diagnosis not present

## 2021-04-26 DIAGNOSIS — M5459 Other low back pain: Secondary | ICD-10-CM | POA: Diagnosis not present

## 2021-04-26 DIAGNOSIS — R262 Difficulty in walking, not elsewhere classified: Secondary | ICD-10-CM | POA: Diagnosis not present

## 2021-04-26 DIAGNOSIS — U099 Post covid-19 condition, unspecified: Secondary | ICD-10-CM | POA: Diagnosis not present

## 2021-04-26 DIAGNOSIS — Z8616 Personal history of COVID-19: Secondary | ICD-10-CM | POA: Diagnosis not present

## 2021-04-28 DIAGNOSIS — U099 Post covid-19 condition, unspecified: Secondary | ICD-10-CM | POA: Diagnosis not present

## 2021-04-28 DIAGNOSIS — Z8616 Personal history of COVID-19: Secondary | ICD-10-CM | POA: Diagnosis not present

## 2021-04-28 DIAGNOSIS — M542 Cervicalgia: Secondary | ICD-10-CM | POA: Diagnosis not present

## 2021-04-28 DIAGNOSIS — R262 Difficulty in walking, not elsewhere classified: Secondary | ICD-10-CM | POA: Diagnosis not present

## 2021-04-28 DIAGNOSIS — R293 Abnormal posture: Secondary | ICD-10-CM | POA: Diagnosis not present

## 2021-04-28 DIAGNOSIS — M5459 Other low back pain: Secondary | ICD-10-CM | POA: Diagnosis not present

## 2021-05-03 DIAGNOSIS — U099 Post covid-19 condition, unspecified: Secondary | ICD-10-CM | POA: Diagnosis not present

## 2021-05-03 DIAGNOSIS — R262 Difficulty in walking, not elsewhere classified: Secondary | ICD-10-CM | POA: Diagnosis not present

## 2021-05-03 DIAGNOSIS — M542 Cervicalgia: Secondary | ICD-10-CM | POA: Diagnosis not present

## 2021-05-03 DIAGNOSIS — M5459 Other low back pain: Secondary | ICD-10-CM | POA: Diagnosis not present

## 2021-05-03 DIAGNOSIS — Z8616 Personal history of COVID-19: Secondary | ICD-10-CM | POA: Diagnosis not present

## 2021-05-03 DIAGNOSIS — R293 Abnormal posture: Secondary | ICD-10-CM | POA: Diagnosis not present

## 2021-05-05 DIAGNOSIS — M5459 Other low back pain: Secondary | ICD-10-CM | POA: Diagnosis not present

## 2021-05-05 DIAGNOSIS — U099 Post covid-19 condition, unspecified: Secondary | ICD-10-CM | POA: Diagnosis not present

## 2021-05-05 DIAGNOSIS — R293 Abnormal posture: Secondary | ICD-10-CM | POA: Diagnosis not present

## 2021-05-05 DIAGNOSIS — Z8616 Personal history of COVID-19: Secondary | ICD-10-CM | POA: Diagnosis not present

## 2021-05-05 DIAGNOSIS — M542 Cervicalgia: Secondary | ICD-10-CM | POA: Diagnosis not present

## 2021-05-05 DIAGNOSIS — R262 Difficulty in walking, not elsewhere classified: Secondary | ICD-10-CM | POA: Diagnosis not present

## 2021-05-12 DIAGNOSIS — R262 Difficulty in walking, not elsewhere classified: Secondary | ICD-10-CM | POA: Diagnosis not present

## 2021-05-12 DIAGNOSIS — U099 Post covid-19 condition, unspecified: Secondary | ICD-10-CM | POA: Diagnosis not present

## 2021-05-12 DIAGNOSIS — Z8616 Personal history of COVID-19: Secondary | ICD-10-CM | POA: Diagnosis not present

## 2021-05-12 DIAGNOSIS — M542 Cervicalgia: Secondary | ICD-10-CM | POA: Diagnosis not present

## 2021-05-12 DIAGNOSIS — R293 Abnormal posture: Secondary | ICD-10-CM | POA: Diagnosis not present

## 2021-05-12 DIAGNOSIS — M5459 Other low back pain: Secondary | ICD-10-CM | POA: Diagnosis not present

## 2021-05-17 DIAGNOSIS — U099 Post covid-19 condition, unspecified: Secondary | ICD-10-CM | POA: Diagnosis not present

## 2021-05-17 DIAGNOSIS — R262 Difficulty in walking, not elsewhere classified: Secondary | ICD-10-CM | POA: Diagnosis not present

## 2021-05-17 DIAGNOSIS — Z8616 Personal history of COVID-19: Secondary | ICD-10-CM | POA: Diagnosis not present

## 2021-05-17 DIAGNOSIS — M5459 Other low back pain: Secondary | ICD-10-CM | POA: Diagnosis not present

## 2021-05-17 DIAGNOSIS — M542 Cervicalgia: Secondary | ICD-10-CM | POA: Diagnosis not present

## 2021-05-17 DIAGNOSIS — R293 Abnormal posture: Secondary | ICD-10-CM | POA: Diagnosis not present

## 2021-05-19 DIAGNOSIS — Z8616 Personal history of COVID-19: Secondary | ICD-10-CM | POA: Diagnosis not present

## 2021-05-19 DIAGNOSIS — R293 Abnormal posture: Secondary | ICD-10-CM | POA: Diagnosis not present

## 2021-05-19 DIAGNOSIS — M542 Cervicalgia: Secondary | ICD-10-CM | POA: Diagnosis not present

## 2021-05-19 DIAGNOSIS — U099 Post covid-19 condition, unspecified: Secondary | ICD-10-CM | POA: Diagnosis not present

## 2021-05-19 DIAGNOSIS — R262 Difficulty in walking, not elsewhere classified: Secondary | ICD-10-CM | POA: Diagnosis not present

## 2021-05-19 DIAGNOSIS — M5459 Other low back pain: Secondary | ICD-10-CM | POA: Diagnosis not present

## 2021-05-25 DIAGNOSIS — R293 Abnormal posture: Secondary | ICD-10-CM | POA: Diagnosis not present

## 2021-05-25 DIAGNOSIS — M542 Cervicalgia: Secondary | ICD-10-CM | POA: Diagnosis not present

## 2021-05-25 DIAGNOSIS — M5459 Other low back pain: Secondary | ICD-10-CM | POA: Diagnosis not present

## 2021-05-25 DIAGNOSIS — E039 Hypothyroidism, unspecified: Secondary | ICD-10-CM | POA: Diagnosis not present

## 2021-05-25 DIAGNOSIS — U099 Post covid-19 condition, unspecified: Secondary | ICD-10-CM | POA: Diagnosis not present

## 2021-05-25 DIAGNOSIS — R262 Difficulty in walking, not elsewhere classified: Secondary | ICD-10-CM | POA: Diagnosis not present

## 2021-05-25 DIAGNOSIS — Z8616 Personal history of COVID-19: Secondary | ICD-10-CM | POA: Diagnosis not present

## 2021-05-27 DIAGNOSIS — R293 Abnormal posture: Secondary | ICD-10-CM | POA: Diagnosis not present

## 2021-05-27 DIAGNOSIS — M5459 Other low back pain: Secondary | ICD-10-CM | POA: Diagnosis not present

## 2021-05-27 DIAGNOSIS — M542 Cervicalgia: Secondary | ICD-10-CM | POA: Diagnosis not present

## 2021-05-27 DIAGNOSIS — R262 Difficulty in walking, not elsewhere classified: Secondary | ICD-10-CM | POA: Diagnosis not present

## 2021-05-27 DIAGNOSIS — Z8616 Personal history of COVID-19: Secondary | ICD-10-CM | POA: Diagnosis not present

## 2021-05-27 DIAGNOSIS — U099 Post covid-19 condition, unspecified: Secondary | ICD-10-CM | POA: Diagnosis not present

## 2021-05-31 DIAGNOSIS — R293 Abnormal posture: Secondary | ICD-10-CM | POA: Diagnosis not present

## 2021-05-31 DIAGNOSIS — R262 Difficulty in walking, not elsewhere classified: Secondary | ICD-10-CM | POA: Diagnosis not present

## 2021-05-31 DIAGNOSIS — M5459 Other low back pain: Secondary | ICD-10-CM | POA: Diagnosis not present

## 2021-05-31 DIAGNOSIS — M542 Cervicalgia: Secondary | ICD-10-CM | POA: Diagnosis not present

## 2021-05-31 DIAGNOSIS — U099 Post covid-19 condition, unspecified: Secondary | ICD-10-CM | POA: Diagnosis not present

## 2021-05-31 DIAGNOSIS — Z8616 Personal history of COVID-19: Secondary | ICD-10-CM | POA: Diagnosis not present

## 2021-06-03 DIAGNOSIS — Z8616 Personal history of COVID-19: Secondary | ICD-10-CM | POA: Diagnosis not present

## 2021-06-03 DIAGNOSIS — M5459 Other low back pain: Secondary | ICD-10-CM | POA: Diagnosis not present

## 2021-06-03 DIAGNOSIS — U099 Post covid-19 condition, unspecified: Secondary | ICD-10-CM | POA: Diagnosis not present

## 2021-06-03 DIAGNOSIS — R262 Difficulty in walking, not elsewhere classified: Secondary | ICD-10-CM | POA: Diagnosis not present

## 2021-06-03 DIAGNOSIS — M542 Cervicalgia: Secondary | ICD-10-CM | POA: Diagnosis not present

## 2021-06-03 DIAGNOSIS — R293 Abnormal posture: Secondary | ICD-10-CM | POA: Diagnosis not present

## 2021-06-07 DIAGNOSIS — R293 Abnormal posture: Secondary | ICD-10-CM | POA: Diagnosis not present

## 2021-06-07 DIAGNOSIS — M542 Cervicalgia: Secondary | ICD-10-CM | POA: Diagnosis not present

## 2021-06-07 DIAGNOSIS — R262 Difficulty in walking, not elsewhere classified: Secondary | ICD-10-CM | POA: Diagnosis not present

## 2021-06-07 DIAGNOSIS — Z8616 Personal history of COVID-19: Secondary | ICD-10-CM | POA: Diagnosis not present

## 2021-06-07 DIAGNOSIS — M5459 Other low back pain: Secondary | ICD-10-CM | POA: Diagnosis not present

## 2021-06-07 DIAGNOSIS — U099 Post covid-19 condition, unspecified: Secondary | ICD-10-CM | POA: Diagnosis not present

## 2021-06-09 DIAGNOSIS — U099 Post covid-19 condition, unspecified: Secondary | ICD-10-CM | POA: Diagnosis not present

## 2021-06-09 DIAGNOSIS — R293 Abnormal posture: Secondary | ICD-10-CM | POA: Diagnosis not present

## 2021-06-09 DIAGNOSIS — M5459 Other low back pain: Secondary | ICD-10-CM | POA: Diagnosis not present

## 2021-06-09 DIAGNOSIS — M542 Cervicalgia: Secondary | ICD-10-CM | POA: Diagnosis not present

## 2021-06-09 DIAGNOSIS — Z8616 Personal history of COVID-19: Secondary | ICD-10-CM | POA: Diagnosis not present

## 2021-06-09 DIAGNOSIS — R262 Difficulty in walking, not elsewhere classified: Secondary | ICD-10-CM | POA: Diagnosis not present

## 2021-06-14 DIAGNOSIS — M5459 Other low back pain: Secondary | ICD-10-CM | POA: Diagnosis not present

## 2021-06-14 DIAGNOSIS — R293 Abnormal posture: Secondary | ICD-10-CM | POA: Diagnosis not present

## 2021-06-14 DIAGNOSIS — U099 Post covid-19 condition, unspecified: Secondary | ICD-10-CM | POA: Diagnosis not present

## 2021-06-14 DIAGNOSIS — Z8616 Personal history of COVID-19: Secondary | ICD-10-CM | POA: Diagnosis not present

## 2021-06-14 DIAGNOSIS — M542 Cervicalgia: Secondary | ICD-10-CM | POA: Diagnosis not present

## 2021-06-14 DIAGNOSIS — R262 Difficulty in walking, not elsewhere classified: Secondary | ICD-10-CM | POA: Diagnosis not present

## 2021-06-21 DIAGNOSIS — M542 Cervicalgia: Secondary | ICD-10-CM | POA: Diagnosis not present

## 2021-06-21 DIAGNOSIS — Z8616 Personal history of COVID-19: Secondary | ICD-10-CM | POA: Diagnosis not present

## 2021-06-21 DIAGNOSIS — R262 Difficulty in walking, not elsewhere classified: Secondary | ICD-10-CM | POA: Diagnosis not present

## 2021-06-21 DIAGNOSIS — M5459 Other low back pain: Secondary | ICD-10-CM | POA: Diagnosis not present

## 2021-06-21 DIAGNOSIS — R293 Abnormal posture: Secondary | ICD-10-CM | POA: Diagnosis not present

## 2021-06-21 DIAGNOSIS — U099 Post covid-19 condition, unspecified: Secondary | ICD-10-CM | POA: Diagnosis not present

## 2021-06-22 ENCOUNTER — Ambulatory Visit: Payer: 59 | Admitting: Internal Medicine

## 2021-06-24 DIAGNOSIS — M5459 Other low back pain: Secondary | ICD-10-CM | POA: Diagnosis not present

## 2021-06-24 DIAGNOSIS — U099 Post covid-19 condition, unspecified: Secondary | ICD-10-CM | POA: Diagnosis not present

## 2021-06-24 DIAGNOSIS — Z8616 Personal history of COVID-19: Secondary | ICD-10-CM | POA: Diagnosis not present

## 2021-06-24 DIAGNOSIS — R262 Difficulty in walking, not elsewhere classified: Secondary | ICD-10-CM | POA: Diagnosis not present

## 2021-06-24 DIAGNOSIS — M542 Cervicalgia: Secondary | ICD-10-CM | POA: Diagnosis not present

## 2021-06-24 DIAGNOSIS — R293 Abnormal posture: Secondary | ICD-10-CM | POA: Diagnosis not present

## 2021-06-29 ENCOUNTER — Other Ambulatory Visit: Payer: Self-pay

## 2021-06-29 DIAGNOSIS — I2699 Other pulmonary embolism without acute cor pulmonale: Secondary | ICD-10-CM

## 2021-06-30 ENCOUNTER — Other Ambulatory Visit: Payer: Self-pay

## 2021-06-30 ENCOUNTER — Inpatient Hospital Stay: Payer: 59 | Attending: Oncology

## 2021-06-30 DIAGNOSIS — Z86711 Personal history of pulmonary embolism: Secondary | ICD-10-CM | POA: Insufficient documentation

## 2021-06-30 DIAGNOSIS — R768 Other specified abnormal immunological findings in serum: Secondary | ICD-10-CM | POA: Diagnosis not present

## 2021-06-30 DIAGNOSIS — I2699 Other pulmonary embolism without acute cor pulmonale: Secondary | ICD-10-CM

## 2021-06-30 DIAGNOSIS — E063 Autoimmune thyroiditis: Secondary | ICD-10-CM | POA: Diagnosis not present

## 2021-06-30 DIAGNOSIS — Z7901 Long term (current) use of anticoagulants: Secondary | ICD-10-CM | POA: Diagnosis not present

## 2021-06-30 DIAGNOSIS — Z8616 Personal history of COVID-19: Secondary | ICD-10-CM | POA: Insufficient documentation

## 2021-06-30 LAB — ANTITHROMBIN III: AntiThromb III Func: 105 % (ref 75–120)

## 2021-07-01 LAB — PROTEIN S, TOTAL: Protein S Ag, Total: 94 % (ref 60–150)

## 2021-07-01 LAB — BETA-2-GLYCOPROTEIN I ABS, IGG/M/A
Beta-2 Glyco I IgG: 10 GPI IgG units (ref 0–20)
Beta-2-Glycoprotein I IgA: <9 GPI IgA units (ref 0–25)
Beta-2-Glycoprotein I IgM: <9 GPI IgM units (ref 0–32)

## 2021-07-02 LAB — PROTEIN C, TOTAL: Protein C, Total: 167 % — ABNORMAL HIGH (ref 60–150)

## 2021-07-08 ENCOUNTER — Inpatient Hospital Stay: Payer: 59 | Admitting: Oncology

## 2021-07-08 ENCOUNTER — Other Ambulatory Visit: Payer: Self-pay

## 2021-07-08 ENCOUNTER — Encounter: Payer: Self-pay | Admitting: Oncology

## 2021-07-08 VITALS — Temp 96.7°F | Resp 24 | Wt 242.5 lb

## 2021-07-08 DIAGNOSIS — M1990 Unspecified osteoarthritis, unspecified site: Secondary | ICD-10-CM

## 2021-07-08 DIAGNOSIS — E063 Autoimmune thyroiditis: Secondary | ICD-10-CM | POA: Diagnosis not present

## 2021-07-08 DIAGNOSIS — I2699 Other pulmonary embolism without acute cor pulmonale: Secondary | ICD-10-CM | POA: Diagnosis not present

## 2021-07-08 DIAGNOSIS — Z1231 Encounter for screening mammogram for malignant neoplasm of breast: Secondary | ICD-10-CM

## 2021-07-08 DIAGNOSIS — Z86711 Personal history of pulmonary embolism: Secondary | ICD-10-CM | POA: Diagnosis not present

## 2021-07-08 DIAGNOSIS — Z7901 Long term (current) use of anticoagulants: Secondary | ICD-10-CM | POA: Diagnosis not present

## 2021-07-08 DIAGNOSIS — R0609 Other forms of dyspnea: Secondary | ICD-10-CM

## 2021-07-08 DIAGNOSIS — R768 Other specified abnormal immunological findings in serum: Secondary | ICD-10-CM | POA: Diagnosis not present

## 2021-07-08 DIAGNOSIS — R0600 Dyspnea, unspecified: Secondary | ICD-10-CM

## 2021-07-08 DIAGNOSIS — Z8616 Personal history of COVID-19: Secondary | ICD-10-CM | POA: Diagnosis not present

## 2021-07-08 MED ORDER — RIVAROXABAN 20 MG PO TABS: 20.0000 mg | ORAL_TABLET | Freq: Every day | ORAL | 1 refills | Status: DC

## 2021-07-08 NOTE — Progress Notes (Signed)
[facility]  [address], [address] [place], [place] [address] Phone: [phone]  Fax: [phone]   Clinic Day:  07/08/2021  Referring physician: Ladonna Snideajiri, Samuel A, DO  Chief Complaint: Bianca Michael is a 55 y.o. female presents for history of pulmonary emboli s/p COVID   PERTINENT ONCOLOGY HISTORY  Patient previously followed up by Dr.Corcoran, patient switched care to me on 07/08/21 Extensive medical record review was performed by me  Chronic history of morbid obesity and Hashimoto's thyroiditis.  01/21/2021 [facility] hospitalization due to respiratory distress secondary to COVID-19 pneumonia. Patient was intubated around 01/24/2021. tocilizumab on 01/22/2021. [facility]/2021 - 03/18/2021. She stayed on a ventilator until the beginning of March.  01/26/2021 CT angio pulmonary showed acute left lower lobe pulmonary embolus and a new 2.5 cm right ventricular luminal thrombus.  No CT evidence of elevated right heart pressure.  Worsening severe diffuse groundglass opacity consistent with COVID-19 pneumonia.  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 She required PRBC transfusions. She was transferred out of the ICU on 02/26/2021. Heparin was transitioned to Eliquis. She continued to improve and was weaned off of oxygen. PEG was removed on the day of discharge. She was referred to outpatient PT and OT. She was discharged on Eliquis.  04/07/2021, establish care with Dr. Merlene Pullingorcoran. Work-up on 04/07/2021 revealed a hematocrit of 38.2, hemoglobin 12.3, platelets 316,000, WBC 6,900 with an ANC of 3700. CMP was normal. Lupus  anticoagulant panel was negative.  Beta-2 glycoprotein IgG was 60 (0-20), IgM < 9, and IgA < 9. Cardiolipin antibodies were normal. Factor 5 Leiden and prothrombin gene mutation are negative Patient was recommended to switch to Xarelto due to insurance coverage.   Family history Her mother and sister had breast cancer. Her maternal aunt had ovarian cancer. Her three sisters all had thyroid issues. One of her sisters has lupus.   INTERVAL HISTORY Bianca SequinCatherine Mary Michael is a 55 y.o. female who has above history reviewed by me today presents for follow up visit for history of pulmonary embolism.  Elevated beta 2 glycoprotein IgG. Problems and complaints are listed below:  Patient reports ongoing breathing issues.  Dyspnea with exertion.  Patient is in the process of establishing care with local primary care provider.  Per patient, previously she was referred to establish care with pulmonology.  She has not been able to see pulmonology due to relocation. She also reports joint aches and pain.   Past Medical History:  Diagnosis Date   Anxiety    Depression    Hyperlipidemia    Hypertension    Pulmonary embolism (HCC)    Thyroid disease     Past Surgical History:  Procedure Laterality Date   CARPAL TUNNEL RELEASE     CHOLECYSTECTOMY     TUBAL LIGATION      Family History  Problem Relation Age of Onset   Cancer Mother    Heart attack Father    Heart disease Father    Cancer Sister    Thyroid disease Sister    Thyroid disease Sister     Social History:  reports that she quit smoking about 20 years ago. Her smoking use included cigarettes. She smoked an average of .5 packs per day. She  has never used smokeless tobacco. She reports previous alcohol use. She reports previous drug use. She denies any alcohol and tobacco use. She denies any exposure to radiation or toxins. She is married. Her daughter is Kyliee. She worked at [employer] but recently moved. She lives in [place].    Allergies: No Known Allergies  Current Medications: Current Outpatient Medications  Medication Sig Dispense Refill   Ascorbic Acid (VITAMIN C) 1000 MG tablet Take 1,000 mg by mouth daily.     buPROPion (WELLBUTRIN XL) 300 MG 24 hr tablet Take by mouth.     Cholecalciferol (VITAMIN D3 ULTRA STRENGTH PO) Take 5,000 Int'l Units/day by mouth.     levothyroxine (SYNTHROID) 100 MCG tablet Take by mouth.     metoprolol tartrate (LOPRESSOR) 25 MG tablet Take by mouth.     Multiple Vitamins-Minerals (WOMENS MULTI) CAPS Take 1 capsule by mouth daily.     omeprazole (PRILOSEC) 40 MG capsule Take 40 mg by mouth daily.     pravastatin (PRAVACHOL) 10 MG tablet Take by mouth.     sertraline (ZOLOFT) 25 MG tablet Take by mouth.     TH MAGNESIUM PO Take 450 mg by mouth daily.     zinc gluconate 50 MG tablet Take 50 mg by mouth daily.     rivaroxaban (XARELTO) 20 MG TABS tablet Take 1 tablet (20 mg total) by mouth daily with supper. 30 tablet 1   No current facility-administered medications for this visit.    Review of Systems  Constitutional:  Negative for chills, diaphoresis, fever, malaise/fatigue and weight loss.  HENT:  Negative for congestion, ear discharge, ear pain, hearing loss, nosebleeds, sinus pain, sore throat and tinnitus.        Sinus drainage. Runny nose.  Eyes:  Negative for blurred vision.  Respiratory:  Positive for shortness of breath (with talking and exertion). Negative for cough, hemoptysis and sputum production.   Cardiovascular:  Negative for chest pain (one episode this morning), palpitations and leg swelling.  Gastrointestinal:  Negative for abdominal pain, blood in stool, constipation, diarrhea, heartburn, melena, nausea (in the mornings) and vomiting.  Genitourinary:  Negative for dysuria, frequency, hematuria and urgency.       Bladder pain  Musculoskeletal:  Positive for back pain and joint pain (elbows and shoulders, arthritis). Negative for myalgias and neck pain.   Skin:  Negative for itching and rash.       "COVID toes"  Neurological:  Positive for sensory change (hand and buttock numbness) and headaches. Negative for dizziness, tingling and weakness.  Endo/Heme/Allergies:  Positive for environmental allergies. Does not bruise/bleed easily.  Psychiatric/Behavioral:  Negative for depression and memory loss. The patient is not nervous/anxious and does not have insomnia.   All other systems reviewed and are negative. Performance status (ECOG): 1-2  Vitals Temperature (!) 96.7 F (35.9 C), temperature source Tympanic, resp. rate (!) 24, weight 242 lb 8.1 oz (110 kg), SpO2 98 %.   Physical Exam Vitals and nursing note reviewed.  Constitutional:      General: She is not in acute distress.    Appearance: She is obese. She is not diaphoretic.  HENT:     Head: Normocephalic and atraumatic.     Mouth/Throat:     Mouth: Mucous membranes are moist.     Pharynx: Oropharynx is clear.  Eyes:     General: No scleral icterus.    Pupils: Pupils are equal, round, and reactive to light.  Cardiovascular:     Rate  and Rhythm: Normal rate and regular rhythm.     Heart sounds: Normal heart sounds. No murmur heard. Pulmonary:     Effort: Pulmonary effort is normal. No respiratory distress.     Breath sounds: Normal breath sounds. No wheezing or rales.  Chest:     Chest wall: No tenderness.  Breasts:    Right: No axillary adenopathy or supraclavicular adenopathy.     Left: No axillary adenopathy or supraclavicular adenopathy.  Abdominal:     General: Bowel sounds are normal. There is no distension.     Palpations: Abdomen is soft. There is no mass.     Tenderness: There is no abdominal tenderness. There is no guarding or rebound.  Musculoskeletal:        General: Normal range of motion.     Cervical back: Normal range of motion and neck supple.     Comments: Trace edema bilaterally.  Lymphadenopathy:     Head:     Right side of head: No preauricular,  posterior auricular or occipital adenopathy.     Left side of head: No preauricular, posterior auricular or occipital adenopathy.     Cervical: No cervical adenopathy.     Upper Body:     Right upper body: No supraclavicular or axillary adenopathy.     Left upper body: No supraclavicular or axillary adenopathy.     Lower Body: No right inguinal adenopathy. No left inguinal adenopathy.  Skin:    General: Skin is warm and dry.     Comments: Well healed scarring from tracheostomy and PEG tube.  See photos of feet.  Neurological:     Mental Status: She is alert and oriented to person, place, and time.  Psychiatric:        Mood and Affect: Mood normal.      No visits with results within 3 Day(s) from this visit.  Latest known visit with results is:  Appointment on 06/30/2021  Component Date Value Ref Range Status   Beta-2 Glyco I IgG 06/30/2021 10  0 - 20 GPI IgG units Final   Comment: (NOTE) The reference interval reflects a 3SD or 99th percentile interval, which is thought to represent a potentially clinically significant result in accordance with the International Consensus Statement on the classification criteria for definitive antiphospholipid syndrome (APS). J Thromb Haem 2006;4:295-306.    Beta-2-Glycoprotein I IgM 06/30/2021 <9  0 - 32 GPI IgM units Final   Comment: (NOTE) The reference interval reflects a 3SD or 99th percentile interval, which is thought to represent a potentially clinically significant result in accordance with the International Consensus Statement on the classification criteria for definitive antiphospholipid syndrome (APS). J Thromb Haem 2006;4:295-306. Performed At: [facility] [address] [place], [place] [id number] Jolene Schimke MD WC:[id number]    Beta-2-Glycoprotein I IgA 06/30/2021 <9  0 - 25 GPI IgA units Final   Comment: (NOTE) The reference interval reflects a 3SD or 99th percentile interval, which is thought to represent a  potentially clinically significant result in accordance with the International Consensus Statement on the classification criteria for definitive antiphospholipid syndrome (APS). J Thromb Haem 2006;4:295-306.    Protein S Ag, Total 06/30/2021 94  60 - 150 % Final   Comment: (NOTE) This test was developed and its performance characteristics determined by [facility]. It has not been cleared or approved by the Food and Drug Administration. Performed At: [facility] [address] [place], [place] [id number] Jolene Schimke MD VQ:[id number]    Protein C, Total  06/30/2021 167 (A) 60 - 150 % Final   Comment: (NOTE) Performed At: [facility] [address] [place], [place] [id number] Jolene Schimke MD [id number]    AntiThromb III Func 06/30/2021 105  75 - 120 % Final   Performed at [facility], [address], [place], [place] [address]    Assessment and plan.  1. Acute pulmonary embolism without acute cor pulmonale, unspecified pulmonary embolism type (HCC)   2. Screening mammogram, encounter for   3. Dyspnea on exertion   4. Arthritis     #Provoked pulmonary embolus associated with COVID-19 pneumonia. Labs are reviewed and discussed with patient Beta-2 glycoprotein IgG level has normalized.  Previous elevations likely due to COVID-19. Normal total protein C and S level.  Normal Antithrombin III level Recommend patient to continue Xarelto 20 mg daily to end of August 2022 to complete total 6 months of anticoagulation. Considering that patient does have other risk factors including obesity, immobility, I recommend to eventually decrease to Xarelto 10 mg for maintenance.  #Dyspnea with exertion. Possible post COVID 19 complications. Refer patient to establish care with pulmonology for further evaluation.  # Joint pain, possible arthritis. Recommend Tylenol PRN and topical Voltaren cream #Health maintenance, patient is overdue for annual mammogram.  Will  obtain.   I discussed the assessment and treatment plan with the patient.  The patient was provided an opportunity to ask questions and all were answered.  The patient agreed with the plan and demonstrated an understanding of the instructions.  The patient was advised to call back if the symptoms worsen or if the condition fails to improve as anticipated.  Rickard Patience, MD, PhD Hematology Oncology [facility] 07/08/2021

## 2021-07-21 ENCOUNTER — Ambulatory Visit: Payer: 59 | Admitting: Internal Medicine

## 2021-07-21 ENCOUNTER — Ambulatory Visit
Admission: RE | Admit: 2021-07-21 | Discharge: 2021-07-21 | Disposition: A | Discharge status: Home / Self Care | Payer: 59 | Attending: Internal Medicine | Admitting: Internal Medicine

## 2021-07-21 ENCOUNTER — Encounter: Payer: Self-pay | Admitting: Internal Medicine

## 2021-07-21 ENCOUNTER — Other Ambulatory Visit: Payer: Self-pay

## 2021-07-21 ENCOUNTER — Ambulatory Visit
Admission: RE | Admit: 2021-07-21 | Discharge: 2021-07-21 | Disposition: A | Discharge status: Home / Self Care | Payer: 59 | Source: Ambulatory Visit | Attending: Internal Medicine | Admitting: Internal Medicine

## 2021-07-21 VITALS — BP 134/86 | HR 70 | Temp 98.7°F | Ht 64.49 in | Wt 246.4 lb

## 2021-07-21 DIAGNOSIS — Z86711 Personal history of pulmonary embolism: Secondary | ICD-10-CM | POA: Diagnosis not present

## 2021-07-21 DIAGNOSIS — F419 Anxiety disorder, unspecified: Secondary | ICD-10-CM

## 2021-07-21 DIAGNOSIS — Z1329 Encounter for screening for other suspected endocrine disorder: Secondary | ICD-10-CM | POA: Diagnosis not present

## 2021-07-21 DIAGNOSIS — R69 Illness, unspecified: Secondary | ICD-10-CM | POA: Diagnosis not present

## 2021-07-21 DIAGNOSIS — M1990 Unspecified osteoarthritis, unspecified site: Secondary | ICD-10-CM | POA: Diagnosis not present

## 2021-07-21 DIAGNOSIS — M25562 Pain in left knee: Secondary | ICD-10-CM | POA: Diagnosis not present

## 2021-07-21 DIAGNOSIS — Z136 Encounter for screening for cardiovascular disorders: Secondary | ICD-10-CM | POA: Diagnosis not present

## 2021-07-21 DIAGNOSIS — U071 COVID-19: Secondary | ICD-10-CM

## 2021-07-21 DIAGNOSIS — M19031 Primary osteoarthritis, right wrist: Secondary | ICD-10-CM | POA: Diagnosis not present

## 2021-07-21 DIAGNOSIS — M25561 Pain in right knee: Secondary | ICD-10-CM | POA: Diagnosis not present

## 2021-07-21 DIAGNOSIS — M170 Bilateral primary osteoarthritis of knee: Secondary | ICD-10-CM | POA: Diagnosis not present

## 2021-07-21 NOTE — Progress Notes (Signed)
BP 134/86   Pulse 70   Temp 98.7 F (37.1 C) (Oral)   Ht 5' 4.49" (1.638 m)   Wt 246 lb 6.4 oz (111.8 kg)   SpO2 97%   BMI 41.66 kg/m    Subjective:    Patient ID: Bianca Michael, female    DOB: May 07, 1966, 55 y.o.   MRN: [id number]  Chief Complaint  Patient presents with   New Patient (Initial Visit)    Est. Care   Joint Pain    Patient states all her joints hurt for the past month. Patient had Covid from January to March    Hypertension   Hyperlipidemia   Anxiety   Depression   Hypothyroidism   Gastroesophageal Reflux    HPI: Bianca Michael is a 55 y.o. female  Pt is new to [place]oved [place] , was in the hosp from Jan x 2 months  for COVID and was  hospitalised  in [place] and then moved to [place] was on dialysis and had a collapsed lung s/p trach, blood clots on right side of her heart. Seeing a pulm in [place] in sept, saw a heme onc -for her PE / blood clots sec to COVID  Cancer centre @ [place] and they want her to continue on xarelto but lower dose. Takes one tab a day. Per pt had black toes when she woke up in march, then toe nails - fell off and new ones came back.   Feels tired exhausted and weak, joints have been playing havoc on her. Wrist on Right hand is the worse. Started with shoulders, elbows, knees   Hypertension This is a chronic problem. The problem is unchanged. Associated symptoms include anxiety. Pertinent negatives include no blurred vision, chest pain, malaise/fatigue, neck pain, orthopnea, palpitations, peripheral edema or PND.  Hyperlipidemia This is a chronic problem. Pertinent negatives include no chest pain.  Anxiety Presents for follow-up visit. Patient reports no chest pain or palpitations.    Depression        This is a chronic problem.  Past medical history includes anxiety.   Gastroesophageal Reflux She reports no chest pain.   Chief Complaint  Patient presents with   New Patient (Initial Visit)     Est. Care   Joint Pain    Patient states all her joints hurt for the past month. Patient had Covid from January to March    Hypertension   Hyperlipidemia   Anxiety   Depression   Hypothyroidism   Gastroesophageal Reflux    Relevant past medical, surgical, family and social history reviewed and updated as indicated. Interim medical history since our last visit reviewed. Allergies and medications reviewed and updated.  Review of Systems  Constitutional:  Negative for malaise/fatigue.  Eyes:  Negative for blurred vision.  Cardiovascular:  Negative for chest pain, palpitations, orthopnea and PND.  Musculoskeletal:  Negative for neck pain.  Psychiatric/Behavioral:  Positive for depression.    Per HPI unless specifically indicated above     Objective:    BP 134/86   Pulse 70   Temp 98.7 F (37.1 C) (Oral)   Ht 5' 4.49" (1.638 m)   Wt 246 lb 6.4 oz (111.8 kg)   SpO2 97%   BMI 41.66 kg/m   Wt Readings from Last 3 Encounters:  07/21/21 246 lb 6.4 oz (111.8 kg)  07/08/21 242 lb 8.1 oz (110 kg)  04/19/21 203 lb (92.1 kg)    Physical Exam Nursing note reviewed.  Vitals reviewed: Scars from past tracheostomy as well as past G-tube noticed her abdomen and chest. Constitutional:      General: She is not in acute distress.    Appearance: Normal appearance. She is not ill-appearing or diaphoretic.  HENT:     Head: Normocephalic and atraumatic.     Right Ear: Tympanic membrane and external ear normal. There is no impacted cerumen.     Left Ear: External ear normal.     Nose: No congestion or rhinorrhea.     Mouth/Throat:     Pharynx: No oropharyngeal exudate or posterior oropharyngeal erythema.  Eyes:     Conjunctiva/sclera: Conjunctivae normal.     Pupils: Pupils are equal, round, and reactive to light.  Cardiovascular:     Rate and Rhythm: Normal rate and regular rhythm.     Heart sounds: No murmur heard.   No friction rub. No gallop.  Pulmonary:     Effort: No  respiratory distress.     Breath sounds: No stridor. No wheezing or rhonchi.  Chest:     Chest wall: No tenderness.  Abdominal:     General: Abdomen is flat. Bowel sounds are normal. There is no distension.     Palpations: Abdomen is soft. There is no mass.     Tenderness: There is no abdominal tenderness. There is no guarding.  Musculoskeletal:        General: No swelling or deformity.     Cervical back: Normal range of motion and neck supple. No rigidity or tenderness.     Right lower leg: No edema.     Left lower leg: No edema.  Skin:    General: Skin is warm and dry.     Coloration: Skin is not jaundiced.     Findings: No erythema.  Neurological:     Mental Status: She is alert and oriented to person, place, and time. Mental status is at baseline.  Psychiatric:        Mood and Affect: Mood normal.        Behavior: Behavior normal.        Thought Content: Thought content normal.        Judgment: Judgment normal.    Results for orders placed or performed in visit on 06/30/21  Beta-2-glycoprotein i abs, IgG/M/A  Result Value Ref Range   Beta-2 Glyco I IgG 10 0 - 20 GPI IgG units   Beta-2-Glycoprotein I IgM <9 0 - 32 GPI IgM units   Beta-2-Glycoprotein I IgA <9 0 - 25 GPI IgA units  Protein S, total  Result Value Ref Range   Protein S Ag, Total 94 60 - 150 %  Protein C, total  Result Value Ref Range   Protein C, Total 167 (H) 60 - 150 %  Antithrombin III  Result Value Ref Range   AntiThromb III Func 105 75 - 120 %        Current Outpatient Medications:    albuterol (VENTOLIN HFA) 108 (90 Base) MCG/ACT inhaler, Inhale into the lungs., Disp: , Rfl:    Ascorbic Acid (VITAMIN C) 1000 MG tablet, Take 1,000 mg by mouth daily., Disp: , Rfl:    buPROPion (WELLBUTRIN XL) 300 MG 24 hr tablet, Take by mouth., Disp: , Rfl:    Cholecalciferol (VITAMIN D3 ULTRA STRENGTH PO), Take 5,000 Int'l Units/day by mouth., Disp: , Rfl:    halobetasol (ULTRAVATE) 0.05 % ointment, Apply  topically 2 (two) times daily., Disp: , Rfl:    hydrOXYzine (ATARAX/VISTARIL)  25 MG tablet, Take by mouth., Disp: , Rfl:    levothyroxine (SYNTHROID) 100 MCG tablet, Take by mouth., Disp: , Rfl:    metoprolol tartrate (LOPRESSOR) 25 MG tablet, Take by mouth., Disp: , Rfl:    Multiple Vitamins-Minerals (WOMENS MULTI) CAPS, Take 1 capsule by mouth daily., Disp: , Rfl:    omeprazole (PRILOSEC) 40 MG capsule, Take 40 mg by mouth daily., Disp: , Rfl:    pravastatin (PRAVACHOL) 10 MG tablet, Take by mouth., Disp: , Rfl:    rivaroxaban (XARELTO) 20 MG TABS tablet, Take 1 tablet (20 mg total) by mouth daily with supper., Disp: 30 tablet, Rfl: 1   sertraline (ZOLOFT) 25 MG tablet, Take by mouth., Disp: , Rfl:    TH MAGNESIUM PO, Take 450 mg by mouth daily., Disp: , Rfl:    zinc gluconate 50 MG tablet, Take 50 mg by mouth daily., Disp: , Rfl:     Assessment & Plan:  Hashimoto's thyroiditis : sees endocrinology @ Dr. Mendel Ryder @ [facility].  PE : Secondary to COVID just saw heme onc Continue current doses of Xarelto and follow-up and management per Joint pains multiple:   Anxiety / depression is on zoloft / Wellbutrin  Continue current medications. Unsure if patient does see psychiatry will discuss with her next visit.   Obesity : would like to try contrvae next visit.  Lifestyle modifications advised to pt. A1c at   Portion control and avoiding high carb low fat diet advised.  Diet plan given to pt   exercise plan given and encouraged.  To increase exercise to 150 mins a week ie 21/2 hours a week. Pt verbalises understanding of the above.   Problem List Items Addressed This Visit       Musculoskeletal and Integument   Arthritis - Primary   Relevant Orders   DG Wrist Complete Right (Completed)   Rheumatoid factor   ANA   ANA+ENA+DNA/DS+Antich+Centr   Uric acid   DG Knee Complete 4 Views Left (Completed)   DG Knee Complete 4 Views Right (Completed)   Comprehensive metabolic panel    CBC with Differential/Platelet     Other   Screening for heart disease   Relevant Orders   Lipid panel   COVID-19   History of pulmonary embolism   Anxiety   Relevant Medications   hydrOXYzine (ATARAX/VISTARIL) 25 MG tablet     Orders Placed This Encounter  Procedures   DG Wrist Complete Right   DG Knee Complete 4 Views Left   DG Knee Complete 4 Views Right   Rheumatoid factor   ANA   ANA+ENA+DNA/DS+Antich+Centr   Uric acid   Comprehensive metabolic panel   Thyroid Panel With TSH   CBC with Differential/Platelet   Lipid panel     No orders of the defined types were placed in this encounter.    Follow up plan: Return in about 4 weeks (around 08/18/2021).

## 2021-07-22 ENCOUNTER — Encounter: Payer: Self-pay | Admitting: Radiology

## 2021-07-22 ENCOUNTER — Ambulatory Visit
Admission: RE | Admit: 2021-07-22 | Discharge: 2021-07-22 | Disposition: A | Discharge status: Home / Self Care | Payer: 59 | Source: Ambulatory Visit | Attending: Oncology | Admitting: Oncology

## 2021-07-22 ENCOUNTER — Other Ambulatory Visit: Payer: Self-pay

## 2021-07-22 DIAGNOSIS — R768 Other specified abnormal immunological findings in serum: Secondary | ICD-10-CM

## 2021-07-22 DIAGNOSIS — Z1231 Encounter for screening mammogram for malignant neoplasm of breast: Secondary | ICD-10-CM

## 2021-07-22 NOTE — Progress Notes (Signed)
Please let pt know this was abnl will need to refer her to rheum urgently. Thnx.

## 2021-07-23 ENCOUNTER — Inpatient Hospital Stay
Admission: RE | Admit: 2021-07-23 | Discharge: 2021-07-23 | Disposition: A | Discharge status: Home / Self Care | Payer: Self-pay | Source: Ambulatory Visit | Attending: *Deleted | Admitting: *Deleted

## 2021-07-23 ENCOUNTER — Other Ambulatory Visit: Payer: Self-pay | Admitting: *Deleted

## 2021-07-23 DIAGNOSIS — Z1231 Encounter for screening mammogram for malignant neoplasm of breast: Secondary | ICD-10-CM

## 2021-07-25 NOTE — Progress Notes (Signed)
Office Visit Note  Patient: Bianca Michael             Date of Birth: 1966/12/18           MRN: [id number]             PCP: Loura Pardon, MD Referring: Loura Pardon, MD Visit Date: 07/26/2021   Subjective:  New Patient (Initial Visit) (Patient complains of bilateral shoulder, bilateral elbow, bilateral wrist (right>left), low back, bilateral knee, and bilateral ankle pain. Patient was hospitalized with COVID from January 2022 to March 2022 and noticed onset following. )   History of Present Illness: Shatisha Falter is a 55 y.o. female here for evaluation of positive ANA with recent symptoms of joint pains, pulmonary embolus on xarelto, and peripheral ischemia in toes transiently attributed to vasopressor treatment with loss of toenails. 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 Overall she has been feeling in about usual health till becoming acutely ill earlier this year.  She does recall some bronchitis type symptoms preceding the January COVID illness and hospitalization.  She does not recall much from that stay since she experienced significant portion of 2 months unconscious in ICU requiring tracheostomy for ventilator support after pulmonary embolus and collapsed lung.  After initially in the hospital she required extensive physical therapy with inability to walk or even transfer independently.  She does feel she has been more reliant on her upper extremity mobility during the initial period due to severe leg weakness. Currently her worst affected area is the right wrist with ongoing pain and swelling and decreased ability to flex and extend without pain. This has been persistently swollen for weeks, the left wrist hurts just intermittently and without visible swelling. She does have family history with first-degree relative and carrying diagnosis of lupus who has been treated with Plaquenil and  prednisone.  Labs reviewed 06/2021 ANA pos dsDNA 13 RNP, Smith, SSA, SSB, chromatin, Jo-1, centromere neg RF 11.3 Uric acid 7.1 B2GP1 IgA/IgM/IgG neg  03/2021 B2GP1 IgG 60 ACA neg LA neg  Imaging reviewed 07/21/21 Xray right knee 4 views Mild osteoarthritis appears most advanced in the medial compartments. 07/21/21 Xray left knee 4 views Mild osteoarthritis appears most advanced in the medial compartments. 07/21/21 Xray right wrist No acute finding. Mild joint space narrowing at the radiocarpal joint in the region of the radial styloid and navicular consistent with mild osteoarthritis. 07/21/21 Xray chest 2 views Mild bronchial thickening. Streaky opacity at the left lung base, may be atelectasis or scarring. No confluent consolidation.   Activities of Daily Living:  Patient reports morning stiffness for 24 hours.   Patient Reports nocturnal pain.  Difficulty dressing/grooming: Reports Difficulty climbing stairs: Reports Difficulty getting out of chair: Reports Difficulty using hands for taps, buttons, cutlery, and/or writing: Reports  Review of Systems  Constitutional:  Positive for fatigue.  HENT:  Positive for mouth sores, mouth dryness and nose dryness.   Eyes:  Positive for itching and dryness. Negative for pain and visual disturbance.  Respiratory:  Positive for cough, shortness of breath and difficulty breathing. Negative for hemoptysis.   Cardiovascular:  Positive for palpitations and swelling in legs/feet. Negative for chest pain.  Gastrointestinal:  Positive for diarrhea. Negative for abdominal pain, blood in stool and constipation.  Endocrine: Negative for increased urination.  Genitourinary:  Negative for painful urination.  Musculoskeletal:  Positive for joint pain, joint pain, joint swelling and morning stiffness.  Negative for myalgias, muscle weakness, muscle tenderness and myalgias.  Skin:  Positive for color change. Negative for rash and redness.   Allergic/Immunologic: Negative for susceptible to infections.  Neurological:  Positive for dizziness, memory loss and weakness. Negative for numbness and headaches.  Hematological:  Negative for swollen glands.  Psychiatric/Behavioral:  Positive for confusion and sleep disturbance.    PMFS History:  Patient Active Problem List   Diagnosis Date Noted   Positive ANA (antinuclear antibody) 07/26/2021   Pain and swelling of right wrist 07/26/2021   Arthritis 07/21/2021   Screening for heart disease 07/21/2021   COVID-19 07/21/2021   History of pulmonary embolism 07/21/2021   Anxiety 07/21/2021   Acute pulmonary embolism without acute cor pulmonale (HCC) 04/07/2021   Elevated liver enzymes 01/28/2021   Hyperlipidemia 01/21/2021   Chronic neck and back pain 03/24/2020   Essential (primary) hypertension 03/13/2020   Major depressive disorder 03/13/2020   Chronic ethmoidal sinusitis 04/02/2019   Chronic frontal sinusitis 04/02/2019   Chronic maxillary sinusitis 04/02/2019   Nasal polyp 04/02/2019   Thyroid disease 06/19/2018    Past Medical History:  Diagnosis Date   Anxiety    COVID-19 12/2020   Patient was hospitalized   Depression    Hyperlipidemia    Hypertension    Pneumothorax    Left lung   Pulmonary embolism (HCC)    Thyroid disease     Family History  Problem Relation Age of Onset   Breast cancer Mother [id number]   Colon cancer Mother    Heart attack Father    Heart disease Father    High Cholesterol Father    Thyroid disease Sister    Breast cancer Sister    Thyroid disease Sister    Thyroid disease Sister    Lupus Sister    Rheum arthritis Sister    Thyroid disease Daughter    Past Surgical History:  Procedure Laterality Date   CARPAL TUNNEL RELEASE     CHOLECYSTECTOMY     TONSILLECTOMY  1970   TUBAL LIGATION     Social History   Social History Narrative   Not on file    There is no immunization history on file for this patient.   Objective: Vital  Signs: BP 122/82 (BP Location: Right Arm, Patient Position: Sitting, Cuff Size: Normal)   Pulse 71   Ht 5\' 4"  (1.626 m)   Wt 248 lb (112.5 kg)   BMI 42.57 kg/m    Physical Exam Constitutional:      Appearance: She is obese.  HENT:     Right Ear: External ear normal.     Left Ear: External ear normal.     Mouth/Throat:     Mouth: Mucous membranes are moist.     Pharynx: Oropharynx is clear.  Eyes:     Conjunctiva/sclera: Conjunctivae normal.  Cardiovascular:     Rate and Rhythm: Normal rate and regular rhythm.  Pulmonary:     Effort: Pulmonary effort is normal.     Breath sounds: Normal breath sounds.  Skin:    General: Skin is warm and dry.     Findings: No rash.     Comments: Normal appearance on nailfold callaroscopy  Neurological:     Mental Status: She is alert.     Deep Tendon Reflexes: Reflexes normal.     Musculoskeletal Exam:  Neck full ROM no tenderness Shoulders full ROM with bilateral pain on abduction and external rotation Elbows full ROM no tenderness or swelling Right  wrist palpable swelling over distal radius extending to ase of the thumb with tenderness to palpation, limited ultrasound inspection appears consistent with tenosynovitis without frank joint effusions, left wrist appears normal Fingers full ROM no tenderness or swelling Knees full ROM with tenderness worst along medial joint line, no palpable effusions Ankles full ROM no tenderness or swelling   Investigation: No additional findings.  Imaging: DG Wrist Complete Right  Result Date: 07/21/2021 CLINICAL DATA:  Joint pain since having coronavirus infection. EXAM: RIGHT WRIST - COMPLETE 3+ VIEW COMPARISON:  None. FINDINGS: No evidence of fracture or focal bone lesion. Mild joint space narrowing at the radiocarpal articulation between the radial styloid and navicular. No other evidence of arthritis. No sign of ligamentous disruption. IMPRESSION: No acute finding. Mild joint space narrowing at the  radiocarpal joint in the region of the radial styloid and navicular consistent with mild osteoarthritis. Electronically Signed   By: Paulina Fusi M.D.   On: 07/21/2021 15:19   DG Knee Complete 4 Views Left  Result Date: 07/21/2021 CLINICAL DATA:  Bilateral knee pain.  No known injury. EXAM: LEFT KNEE - COMPLETE 4+ VIEW; RIGHT KNEE - COMPLETE 4+ VIEW COMPARISON:  None. FINDINGS: There is no acute bony or joint abnormality. Small medial and patellofemoral osteophytes are seen about both knees. There is mild bilateral medial compartment joint space narrowing. No erosion or focal bony lesion. No joint effusion or chondrocalcinosis. IMPRESSION: No acute abnormality. Mild osteoarthritis appears most advanced in the medial compartments. Electronically Signed   By: Drusilla Kanner M.D.   On: 07/21/2021 15:20   DG Knee Complete 4 Views Right  Result Date: 07/21/2021 CLINICAL DATA:  Bilateral knee pain.  No known injury. EXAM: LEFT KNEE - COMPLETE 4+ VIEW; RIGHT KNEE - COMPLETE 4+ VIEW COMPARISON:  None. FINDINGS: There is no acute bony or joint abnormality. Small medial and patellofemoral osteophytes are seen about both knees. There is mild bilateral medial compartment joint space narrowing. No erosion or focal bony lesion. No joint effusion or chondrocalcinosis. IMPRESSION: No acute abnormality. Mild osteoarthritis appears most advanced in the medial compartments. Electronically Signed   By: Drusilla Kanner M.D.   On: 07/21/2021 15:20   MM 3D SCREEN BREAST BILATERAL  Result Date: 07/23/2021 CLINICAL DATA:  Screening. EXAM: DIGITAL SCREENING BILATERAL MAMMOGRAM WITH TOMOSYNTHESIS AND CAD TECHNIQUE: Bilateral screening digital craniocaudal and mediolateral oblique mammograms were obtained. Bilateral screening digital breast tomosynthesis was performed. The images were evaluated with computer-aided detection. COMPARISON:  Previous exam(s). ACR Breast Density Category b: There are scattered areas of fibroglandular  density. FINDINGS: There are no findings suspicious for malignancy. IMPRESSION: No mammographic evidence of malignancy. A result letter of this screening mammogram will be mailed directly to the patient. RECOMMENDATION: Screening mammogram in one year. (Code:SM-B-01Y) BI-RADS CATEGORY  1: Negative. Electronically Signed   By: Hulan Saas M.D.   On: 07/23/2021 13:15  MM Outside Films Mammo  Result Date: 07/23/2021 This examination belongs to an outside facility and is stored here for comparison purposes only.  Contact the originating outside institution for any associated report or interpretation.   Recent Labs: Lab Results  Component Value Date   WBC 6.9 04/07/2021   HGB 12.3 04/07/2021   PLT 316 04/07/2021   NA 136 04/07/2021   K 3.6 04/07/2021   CL 104 04/07/2021   CO2 25 04/07/2021   GLUCOSE 108 (H) 04/07/2021   BUN 7 04/07/2021   CREATININE 0.74 04/07/2021   BILITOT 0.5 04/07/2021   ALKPHOS 83  04/07/2021   AST 20 04/07/2021   ALT 23 04/07/2021   PROT 7.5 04/07/2021   ALBUMIN 3.6 04/07/2021   CALCIUM 9.2 04/07/2021    Speciality Comments: No specialty comments available.  Procedures:  No procedures performed Allergies: Patient has no known allergies.   Assessment / Plan:     Visit Diagnoses: Positive ANA (antinuclear antibody) - Plan: ANA, C3 and C4, Sedimentation rate, C-reactive protein, Urinalysis, Routine w reflex microscopic  Numerous ongoing symptoms for months most prominently arthritis with positive ANA including a weakly positive double-stranded DNA antibodies.  Cannot confirm a clinical diagnosis of lupus at this time would certainly consider a reactive arthritis process the extremely severe complications with COVID and hospitalization earlier this year.  We will check ANA immunofluorescence today also checking complement components screening urinalysis we will check sedimentation rate and CRP for inflammatory markers.  Pain and swelling of right wrist - Plan:  Sedimentation rate, C-reactive protein Arthritis  Multiple joint pains newly objectively inflamed area on exam today of the right wrist looks most consistent with tenosynovitis including the first dorsal extensor compartment work-up as above if this problem remains out of proportion to others then follow-up with other specific findings may recommend a trial of local steroid injection.  History of pulmonary embolism  History of PE provoked with COVID infection previously had beta-2 glycoprotein 1 antibody positive but was negative on repeat later.  Negative anticardiolipins and confirmatory testing that was negative for lupus anticoagulant.  Orders: Orders Placed This Encounter  Procedures   ANA   C3 and C4   Sedimentation rate   C-reactive protein   Urinalysis, Routine w reflex microscopic    No orders of the defined types were placed in this encounter.   Follow-Up Instructions: Return in about 2 weeks (around 08/09/2021) for New pt ?SLE v COVID related f/u 2wks.   Fuller Plan, MD  Note - This record has been created using [employer].  Chart creation errors have been sought, but may not always  have been located. Such creation errors do not reflect on  the standard of medical care.

## 2021-07-26 ENCOUNTER — Ambulatory Visit: Payer: 59 | Admitting: Internal Medicine

## 2021-07-26 ENCOUNTER — Other Ambulatory Visit: Payer: Self-pay

## 2021-07-26 ENCOUNTER — Encounter: Payer: Self-pay | Admitting: Internal Medicine

## 2021-07-26 VITALS — BP 122/82 | HR 71 | Ht 64.0 in | Wt 248.0 lb

## 2021-07-26 DIAGNOSIS — Z86711 Personal history of pulmonary embolism: Secondary | ICD-10-CM | POA: Diagnosis not present

## 2021-07-26 DIAGNOSIS — M1990 Unspecified osteoarthritis, unspecified site: Secondary | ICD-10-CM

## 2021-07-26 DIAGNOSIS — R768 Other specified abnormal immunological findings in serum: Secondary | ICD-10-CM

## 2021-07-26 DIAGNOSIS — M25431 Effusion, right wrist: Secondary | ICD-10-CM | POA: Diagnosis not present

## 2021-07-26 DIAGNOSIS — M25531 Pain in right wrist: Secondary | ICD-10-CM

## 2021-07-26 NOTE — Patient Instructions (Signed)
Antinuclear Antibody Test Why am I having this test? This is a test that is used to help diagnose systemic lupus erythematosus (SLE) and other autoimmune diseases. An autoimmune disease is a disease in which the body's own defense (immune)system attacks its organs. What is being tested? This test checks for antinuclear antibodies (ANA) in the blood. The presence of ANA is associated with several autoimmune diseases. It is seen in almost allpatients with lupus. What kind of sample is taken?  A blood sample is required for this test. It is usually collected by insertinga needle into a blood vessel. How are the results reported? Your test results will be reported as either positive or negative. A false-positive result can occur. A false positive is incorrect because itmeans that a condition is present when it is not. What do the results mean? A positive test result may mean that you have: Lupus. Other autoimmune diseases, such as rheumatoid arthritis, scleroderma, or Sjgren syndrome. Conditions that may cause a false-positive result include: Liver dysfunction. Myasthenia gravis. Infectious mononucleosis. Talk with your health care provider about what your results mean. Questions to ask your health care provider Ask your health care provider, or the department that is doing the test: When will my results be ready? How will I get my results? What are my treatment options? What other tests do I need? What are my next steps? Summary This is a test that is used to help diagnose systemic lupus erythematosus (SLE) and other autoimmune diseases. An autoimmune disease is a disease in which the body's own defense (immune)system attacks the body. This test checks for antinuclear antibodies (ANA) in the blood. The presence of ANA is associated with several autoimmune diseases. It is seen in almost all patients with lupus. Your test results will be reported as either positive or negative. Talk with  your health care provider about what your results mean.  Anti-DNA Antibody Test Why am I having this test? The anti-DNA antibody test helps with the diagnosis and follow-up of systemic lupus erythematosus (SLE). It is also used to monitor treatment of thiscondition as the antibody decreases with successful therapy. What is being tested? This test measures the amount of anti-DNA antibody in the blood. This antibody is found in 65-80% of patients with active SLE. This antibody is not as commonin patients who have other diseases. What kind of sample is taken?  A blood sample is required for this test. It is usually collected by insertinga needle into a blood vessel. How are the results reported? Your test results will be reported as a value. Your test results may also be reported as positive, intermediate, or negative. Your health care provider will compare your results to normal ranges that were established after testing a large group of people (reference values). Reference values may vary among labs and hospitals. For this test, common reference values are: Positive: 10 or more international units/mL. Intermediate: 5-9 international units/mL. Negative: Less than 5 international units/mL. What do the results mean? Positive results, which are associated with results that are higher than the reference values, may indicate: Autoimmune disorders such as SLE. Infectious mononucleosis. Chronic liver conditions. Intermediate results mean that the anti-DNA antibody levels are higher thannormal, but not high enough to be considered positive. Negative results mean that you do not have the anti-DNA antibody that isassociated with these conditions. Talk with your health care provider about what your results mean. Questions to ask your health care provider Ask your health care provider, or the department  that is doing the test: When will my results be ready? How will I get my results? What are my  treatment options? What other tests do I need? What are my next steps? Summary The anti-DNA antibody test helps with the diagnosis and follow-up of systemic lupus erythematosus (SLE). It is also used to monitor treatment of this condition as the antibody decreases with successful therapy. This test measures the amount of anti-DNA antibody in the blood. Elevated levels of anti-DNA antibody can be seen in patients with SLE and certain other conditions.  Complement Assay Test Why am I having this test? Complement refers to a group of proteins that are part of the body's disease-fighting system (immune system). A complement assay test provides information about some or all of these proteins. You may have this test: To diagnose a lack, or deficiency, of certain complement proteins. Deficiencies can be passed from parent to child (inherited). To monitor an infection or autoimmune disease. If you have unexplained inflammation or swelling (edema). If you have bacterial infections again and again. What is being tested? This test can be used to measure: Total complement. This is the total number of protein complements in your blood. The number of each kind of complement in your blood. The nine main kinds of complement are labeled C1 through C9. Some of these complements, such as C3 and C4, are especially important and have many functions in the body. Depending on why you are having the test, your health care provider may test your total complement or only some individual complements, such as C3 and C4. The total complement assay test may be done before individual complements aretested. What kind of sample is taken?  A blood sample is required for this test. It is usually collected by insertinga needle into a blood vessel. Tell a health care provider about: Any allergies you have. All medicines you are taking, including vitamins, herbs, eye drops, creams, and over-the-counter medicines. Any blood  disorders you have. Any surgeries you have had. Any medical conditions you have. Whether you are pregnant or may be pregnant. How are the results reported? Your results will be reported as a value that tells you how much complement is in your blood. This will be given as units per milliliter of blood (units/mL) or as milligrams per deciliter of blood (mg/dL). Your results may be reportedas total complement, or as individual complements, or both. Your health care provider will compare your results to normal ranges that were established after testing a large group of people (reference ranges). Reference ranges may vary among labs and hospitals. For this test, reference ranges for some of the most commonly measured complement assays may be: Total complement: 30-75 units/mL. C2: 1-4 mg/dL. C3: 75-175 mg/dL. C4: 22-45 units/mL. What do the results mean? Results within reference ranges are considered normal, which means you have anormal amount of complement in your blood. Results that are higher than the reference ranges may be caused by: Inflammatory disease. Heart attack. Cancer. Complement deficiencies, or results lower than the reference ranges, may be caused by: Certain inherited conditions. Autoimmune disease. Certain liver diseases. Malnutrition. Certain types of anemia that result in breakdown of red blood cells (hemolytic anemia). Talk with your health care provider about what your results mean. Questions to ask your health care provider Ask your health care provider, or the department that is doing the test: When will my results be ready? How will I get my results? What are my treatment options? What other tests  do I need? What are my next steps? Summary Complement refers to a group of proteins that are part of the body's disease-fighting system (immune system). A complement assay test can provide information about some or all of these proteins. You may have a complement assay  test to help diagnose a complement deficiency, and to monitor some infections or autoimmune disease. Talk with your health care provider about what your results mean.  Erythrocyte Sedimentation Rate Test Why am I having this test? The erythrocyte sedimentation rate (ESR) test is used to help find illnesses related to: Sudden (acute) or long-term (chronic) infections. Inflammation. The body's disease-fighting system attacking healthy cells (autoimmune diseases). Cancer. Tissue death. If you have symptoms that may be related to any of these illnesses, your health care provider may do an ESR test before doing more specific tests. If you have an inflammatory immune disease, such as rheumatoid arthritis, you may have thistest to help monitor your therapy. What is being tested? This test measures how long it takes for your red blood cells (erythrocytes) to settle in a solution over a certain amount of time (sedimentation rate). When you have an infection or inflammation, your red blood cells clump together and settle faster. The sedimentation rate provides information abouthow much inflammation is present in the body. What kind of sample is taken?  A blood sample is required for this test. It is usually collected by insertinga needle into a blood vessel. How do I prepare for this test? Follow any instructions from your health care provider about changing orstopping your regular medicines. Tell a health care provider about: Any allergies you have. All medicines you are taking, including vitamins, herbs, eye drops, creams, and over-the-counter medicines. Any blood disorders you have. Any surgeries you have had. Any medical conditions you have, such as thyroid or kidney disease. Whether you are pregnant or may be pregnant. How are the results reported? Your results will be reported as a value that measures sedimentation rate in millimeters per hour (mm/hr). Your health care provider will compare  your results to normal ranges that were established after testing a large group of people (reference values). Reference values may vary among labs and hospitals. For this test, common reference values, which vary by age and gender, are: Newborn: 0-2 mm/hr. Child, up to puberty: 0-10 mm/hr. Female: Under 50 years: 0-20 mm/hr. 50-85 years: 0-30 mm/hr. Over 85 years: 0-42 mm/hr. Female: Under 50 years: 0-15 mm/hr. 50-85 years: 0-20 mm/hr. Over 85 years: 0-30 mm/hr. Certain conditions or medicines may cause ESR levels to be falsely lower or higher, such as: Pregnancy. Obesity. Steroids, birth control pills, and blood thinners. Thyroid or kidney disease. What do the results mean? Results that are within reference values are considered normal, meaning that the level of inflammation in your body is healthy. High ESR levels mean that there is inflammation in your body. You will have more tests to help make adiagnosis. Inflammation may result from many different conditions or injuries. Talk with your health care provider about what your results mean. Questions to ask your health care provider Ask your health care provider, or the department that is doing the test: When will my results be ready? How will I get my results? What are my treatment options? What other tests do I need? What are my next steps? Summary The erythrocyte sedimentation rate (ESR) test is used to help find illnesses associated with sudden (acute) or long-term (chronic) infections, inflammation, autoimmune diseases, cancer, or tissue death. If  you have symptoms that may be related to any of these illnesses, your health care provider may do an ESR test before doing more specific tests. If you have an inflammatory immune disease, such as rheumatoid arthritis, you may have this test to help monitor your therapy. This test measures how long it takes for your red blood cells (erythrocytes) to settle in a solution over a certain amount  of time (sedimentation rate). This provides information about how much inflammation is present in the body.  Urinalysis Test Why am I having this test? You may have a urinalysis (UA) test: As part of routine wellness screening. Before surgery. During pregnancy. You may also need to have this test if you have: Kidney disease. Symptoms of a urinary tract infection (UTI). Diabetes. A condition that causes an imbalance in your hormones. What is being tested? A urinalysis is a series of tests done on a sample of your urine. Your kidneys filter your blood to make urine. They get rid of waste products and save the important parts of your blood, such as proteins and minerals (electrolytes). You may need more testing if your UA shows too much: Protein. Sugar (glucose). Blood cells. Bacteria. What kind of sample is taken? A urine sample is required for this test. How do I collect samples at home? You usually collect a urine sample by urinating into a clean cup. You may be asked to collect a urine sample first thing in the morning. When collecting a urine sample at home, make sure you: Use supplies and instructions that you received from the lab. Collect urine only in the germ-free (sterile) cup that you received from the lab. Do not let any toilet paper or stool (feces) get into the cup. Refrigerate the sample until you can return it to the lab. Return the sample or samples to the lab as told. Tell a health care provider about: All medicines you are taking, including vitamins, herbs, eye drops, creams, and over-the-counter medicines. Any medical conditions you have. Whether you are pregnant or may be pregnant. What happens during the test? UA is divided into three parts: A visual exam of your urine sample to check for color or cloudiness. A dipstick test to check for: Proteins. Concentration (specific gravity). Acidity (pH). Glucose. Ketones. These are by-products of your body burning  fat for energy instead of sugar. A waste product from red blood cells (bilirubin). A product of white blood cells (leukocyte esterase). A product of bacteria (nitrite). Blood. A microscopic exam to check for: Red blood cells. White blood cells. Tube-shaped proteins (hyaline casts). Crystal structures. Bacteria. Epithelial cells. These are cells that line your urinary tract. Yeast. How are the results reported? Some of your test results will be reported as values. Your health care provider will compare your results to normal ranges that were established after testing a large group of people (reference ranges). Reference ranges may vary among different labs and hospitals. For this test, common reference ranges are: pH: 4.6-8.0 (average, 6.0). Protein: 0-8 mg/dL. 50-80 mg/24 hr (at rest). Less than 250 mg/24 hr (during exercise). Specific gravity: Adult: 1.005-1.030 (usually, 1.010-1.025). Elderly: values decrease with age. Newborn: 1.001-1.020. Nitrites: none. Ketones: none. Bilirubin: none. Urobilinogen: 7.37-1 Ehrlich unit/mL. Crystals: none. Casts: none. Glucose: Fresh specimen: none. 24-hour specimen: 50-300 mg/24 hr or 0.3-1.7 mmol/day (SI units). White blood cells (WBCs): 0-4 per low-power field. WBC casts: none. Red blood cells (RBCs): Less than or equal to 2. RBC casts: none. Epithelial cells: Few or 0-4  per low-power field. Bacteria: none. Yeast: none. Other results may be reported based on the appearance and odor of the sample. For this test, normal results are: Appearance: clear. Color: amber yellow. Odor: aromatic. Still other results may be reported as positive or negative. For this test, normal results are: Negative for leukocyte esterase. What do the results mean? Many conditions can cause abnormal UA results: Cloudy urine may be a sign of a UTI. Acetone odor may indicate a buildup of blood acids in people who have diabetes (diabetic ketoacidosis). Fecal  odor can indicate an abnormal connection (fistula) between the intestine and the bladder. Ammonia odor can occur after a person holds urine in the bladder for too long. Pungent odor may indicate a UTI. Blood in the urine may be a sign of kidney disease, UTI, or other conditions. White blood cells may be a sign of a UTI. Crystals may be a sign of a kidney stone or other kidney disease. High pH may mean you have a kidney stone, UTI, or kidney disease. Protein may be a sign of kidney disease, high blood pressure in pregnancy (toxemia), or other conditions. Glucose may be a sign of diabetes. Urobilinogen may be a sign of liver disease. Leukocyte esterase may indicate a UTI. Nitrites may indicate a UTI. Talk with your health care provider about what your results mean. Questions to ask your health care provider Ask your health care provider, or the department that is doing the test: When will my results be ready? How will I get my results? What are my treatment options? What other tests do I need? What are my next steps? Summary A urinalysis (UA) is a series of tests done on a sample of your urine. The test may be ordered as part of a routine exam, during pregnancy, before surgery, or if you have certain symptoms. The urinalysis is divided into three parts: a visual exam, a dipstick test, and a microscopic exam. Your health care provider will compare your results to normal ranges that were established after testing a large group of people (reference ranges). Talk with your health care provider about what your results mean. This information is not intended to replace advice given to you by your health care provider. Make sure you discuss any questions you have with your healthcare provider. Document Revised: 07/24/2020 Document Reviewed: 07/24/2020 [employer] Patient Education  [place].

## 2021-07-28 LAB — ANA+ENA+DNA/DS+ANTICH+CENTR
ANA Titer 1: NEGATIVE
Anti JO-1: <0.2 AI (ref 0.0–0.9)
Centromere Ab Screen: <0.2 AI (ref 0.0–0.9)
Chromatin Ab SerPl-aCnc: <0.2 AI (ref 0.0–0.9)
ENA RNP Ab: 0.4 AI (ref 0.0–0.9)
ENA SM Ab Ser-aCnc: <0.2 AI (ref 0.0–0.9)
ENA SSA (RO) Ab: <0.2 AI (ref 0.0–0.9)
ENA SSB (LA) Ab: <0.2 AI (ref 0.0–0.9)
Scleroderma (Scl-70) (ENA) Antibody, IgG: 1 AI — ABNORMAL HIGH (ref 0.0–0.9)
dsDNA Ab: 13 IU/mL — ABNORMAL HIGH (ref 0–9)

## 2021-07-28 LAB — URINALYSIS, ROUTINE W REFLEX MICROSCOPIC
Bacteria, UA: NONE SEEN /HPF
Bilirubin Urine: NEGATIVE
Glucose, UA: NEGATIVE
Hgb urine dipstick: NEGATIVE
Hyaline Cast: NONE SEEN /LPF
Ketones, ur: NEGATIVE
Leukocytes,Ua: NEGATIVE
Nitrite: NEGATIVE
Protein, ur: NEGATIVE
RBC / HPF: NONE SEEN /HPF (ref 0–2)
Specific Gravity, Urine: 1.017 (ref 1.001–1.035)
Squamous Epithelial / HPF: NONE SEEN /HPF (ref <=5)
WBC, UA: NONE SEEN /HPF (ref 0–5)
pH: 5.5 (ref 5.0–8.0)

## 2021-07-28 LAB — ANA
Anti Nuclear Antibody (ANA): POSITIVE — AB
Anti Nuclear Antibody (ANA): POSITIVE — AB

## 2021-07-28 LAB — URIC ACID: Uric Acid: 7.1 mg/dL (ref 3.0–7.2)

## 2021-07-28 LAB — C-REACTIVE PROTEIN: CRP: 2.9 mg/L (ref <8.0)

## 2021-07-28 LAB — C3 AND C4
C3 Complement: 177 mg/dL (ref 83–193)
C4 Complement: 30 mg/dL (ref 15–57)

## 2021-07-28 LAB — SEDIMENTATION RATE: Sed Rate: 11 mm/h (ref 0–30)

## 2021-07-28 LAB — RHEUMATOID FACTOR: Rheumatoid fact SerPl-aCnc: 11.3 IU/mL (ref <14.0)

## 2021-07-28 LAB — ANTI-NUCLEAR AB-TITER (ANA TITER): ANA Titer 1: 1:40 {titer} — ABNORMAL HIGH

## 2021-08-10 NOTE — Progress Notes (Signed)
Office Visit Note  Patient: Bianca Michael             Date of Birth: 1966/09/04           MRN: [id number]             PCP: Loura Pardon, MD Referring: Loura Pardon, MD Visit Date: 08/11/2021   Subjective:  Follow-up (Patient complains of continued bilateral shoulder, bilateral elbow, bilateral wrist (right>left), low back, bilateral knee, and bilateral ankle pain. )   History of Present Illness: Bianca Michael is a 55 y.o. female here for follow up for positive ANA and joint pains and numerous symptoms since severe COVID illness. Lab testing at initial visit redemonstrated ANA at 1:40 nuclear dots pattern. She did not have very active inflammation at the time outside of right wrist probable first dorsal compartment.  Symptoms remain about the same since last visit with somewhat generalized joint pain still worst in the right wrist and left shoulder.  She is interested in trying possible treatments to help reduce her overall body aches and fatigue level but does not want to go on long-term steroids due to previous extensive weight gain from them  Previous HPI: 07/26/21 [name] is a 56 y.o. female here for evaluation of positive ANA with recent symptoms of joint pains, pulmonary embolus on xarelto, and peripheral ischemia in toes transiently attributed to vasopressor treatment with loss of toenails. 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 Overall she has been feeling in about usual health till becoming acutely ill earlier this year.  She does recall some bronchitis type symptoms preceding the January COVID illness and hospitalization.  She does not recall much from that stay since she experienced significant portion of 2 months unconscious in ICU requiring tracheostomy for ventilator support after pulmonary embolus and collapsed lung.  After initially in the hospital she required extensive  physical therapy with inability to walk or even transfer independently.  She does feel she has been more reliant on her upper extremity mobility during the initial period due to severe leg weakness. Currently her worst affected area is the right wrist with ongoing pain and swelling and decreased ability to flex and extend without pain. This has been persistently swollen for weeks, the left wrist hurts just intermittently and without visible swelling. She does have family history with first-degree relative and carrying diagnosis of lupus who has been treated with Plaquenil and prednisone.   Labs reviewed 06/2021 ANA pos dsDNA 13 RNP, Smith, SSA, SSB, chromatin, Jo-1, centromere neg RF 11.3 Uric acid 7.1 B2GP1 IgA/IgM/IgG neg   03/2021 B2GP1 IgG 60 ACA neg LA neg   Imaging reviewed 07/21/21 Xray right knee 4 views Mild osteoarthritis appears most advanced in the medial compartments. 07/21/21 Xray left knee 4 views Mild osteoarthritis appears most advanced in the medial compartments. 07/21/21 Xray right wrist No acute finding. Mild joint space narrowing at the radiocarpal joint in the region of the radial styloid and navicular consistent with mild osteoarthritis. 07/21/21 Xray chest 2 views Mild bronchial thickening. Streaky opacity at the left lung base, may be atelectasis or scarring. No confluent consolidation.  Review of Systems  Constitutional:  Positive for fatigue.  HENT:  Positive for mouth sores and mouth dryness. Negative for nose dryness.   Eyes:  Positive for pain, itching, visual disturbance and dryness.  Respiratory:  Positive for shortness of breath and difficulty breathing. Negative for cough  and hemoptysis.   Cardiovascular:  Positive for chest pain and swelling in legs/feet. Negative for palpitations.  Gastrointestinal:  Negative for abdominal pain, blood in stool, constipation and diarrhea.  Endocrine: Negative for increased urination.  Genitourinary:  Negative for  painful urination.  Musculoskeletal:  Positive for joint pain, joint pain, joint swelling, morning stiffness and muscle tenderness. Negative for myalgias, muscle weakness and myalgias.  Skin:  Negative for color change, rash and redness.  Allergic/Immunologic: Negative for susceptible to infections.  Neurological:  Positive for numbness. Negative for dizziness, headaches, memory loss and weakness.  Hematological:  Negative for swollen glands.  Psychiatric/Behavioral:  Positive for sleep disturbance. Negative for confusion.    PMFS History:  Patient Active Problem List   Diagnosis Date Noted   Positive ANA (antinuclear antibody) 07/26/2021   Pain and swelling of right wrist 07/26/2021   Inflammatory arthritis 07/21/2021   Screening for heart disease 07/21/2021   COVID-19 07/21/2021   History of pulmonary embolism 07/21/2021   Anxiety 07/21/2021   Acute pulmonary embolism without acute cor pulmonale (HCC) 04/07/2021   Elevated liver enzymes 01/28/2021   Hyperlipidemia 01/21/2021   Chronic neck and back pain 03/24/2020   Essential (primary) hypertension 03/13/2020   Major depressive disorder 03/13/2020   Chronic ethmoidal sinusitis 04/02/2019   Chronic frontal sinusitis 04/02/2019   Chronic maxillary sinusitis 04/02/2019   Nasal polyp 04/02/2019   Thyroid disease 06/19/2018    Past Medical History:  Diagnosis Date   Anxiety    COVID-19 12/2020   Patient was hospitalized   Depression    Hyperlipidemia    Hypertension    Pneumothorax    Left lung   Pulmonary embolism (HCC)    Thyroid disease     Family History  Problem Relation Age of Onset   Breast cancer Mother 48   Colon cancer Mother    Heart attack Father    Heart disease Father    High Cholesterol Father    Thyroid disease Sister    Breast cancer Sister    Thyroid disease Sister    Thyroid disease Sister    Lupus Sister    Rheum arthritis Sister    Thyroid disease Daughter    Past Surgical History:   Procedure Laterality Date   CARPAL TUNNEL RELEASE     CHOLECYSTECTOMY     TONSILLECTOMY  1970   TUBAL LIGATION     Social History   Social History Narrative   Not on file    There is no immunization history on file for this patient.   Objective: Vital Signs: BP 110/72 (BP Location: Left Arm, Patient Position: Sitting, Cuff Size: Large)   Pulse 69   Ht 5' 4.5" (1.638 m)   Wt 251 lb 9.6 oz (114.1 kg)   BMI 42.52 kg/m    Physical Exam Constitutional:      Appearance: She is obese.  Skin:    General: Skin is warm and dry.     Findings: No rash.  Neurological:     Mental Status: She is alert.  Psychiatric:        Mood and Affect: Mood normal.     Musculoskeletal Exam:  Neck full ROM no tenderness Right shoulder full range of motion left shoulder tenderness to pressure anteriorly pain with full abduction and rotation while raised Elbows full ROM no tenderness or swelling Right wrist with mild swelling no warmth tenderness to pressure over the radial head and first dorsal compartment extending to the first [facility] joint,  left wrist full range of motion no tenderness or swelling Fingers mild skin tightness or thickening diffusely with near normal range of motion no palpable synovitis Knees full ROM no tenderness or swelling  CDAI Exam: CDAI Score: -- Patient Global: --; Provider Global: -- Swollen: 1 ; Tender: 3  Joint Exam 08/11/2021      Right  Left  Glenohumeral      Tender  Wrist  Swollen Tender     CMC   Tender        Investigation: No additional findings.  Imaging: DG Wrist Complete Right  Result Date: 07/21/2021 CLINICAL DATA:  Joint pain since having coronavirus infection. EXAM: RIGHT WRIST - COMPLETE 3+ VIEW COMPARISON:  None. FINDINGS: No evidence of fracture or focal bone lesion. Mild joint space narrowing at the radiocarpal articulation between the radial styloid and navicular. No other evidence of arthritis. No sign of ligamentous disruption.  IMPRESSION: No acute finding. Mild joint space narrowing at the radiocarpal joint in the region of the radial styloid and navicular consistent with mild osteoarthritis. Electronically Signed   By: Paulina Fusi M.D.   On: 07/21/2021 15:19   DG Knee Complete 4 Views Left  Result Date: 07/21/2021 CLINICAL DATA:  Bilateral knee pain.  No known injury. EXAM: LEFT KNEE - COMPLETE 4+ VIEW; RIGHT KNEE - COMPLETE 4+ VIEW COMPARISON:  None. FINDINGS: There is no acute bony or joint abnormality. Small medial and patellofemoral osteophytes are seen about both knees. There is mild bilateral medial compartment joint space narrowing. No erosion or focal bony lesion. No joint effusion or chondrocalcinosis. IMPRESSION: No acute abnormality. Mild osteoarthritis appears most advanced in the medial compartments. Electronically Signed   By: Drusilla Kanner M.D.   On: 07/21/2021 15:20   DG Knee Complete 4 Views Right  Result Date: 07/21/2021 CLINICAL DATA:  Bilateral knee pain.  No known injury. EXAM: LEFT KNEE - COMPLETE 4+ VIEW; RIGHT KNEE - COMPLETE 4+ VIEW COMPARISON:  None. FINDINGS: There is no acute bony or joint abnormality. Small medial and patellofemoral osteophytes are seen about both knees. There is mild bilateral medial compartment joint space narrowing. No erosion or focal bony lesion. No joint effusion or chondrocalcinosis. IMPRESSION: No acute abnormality. Mild osteoarthritis appears most advanced in the medial compartments. Electronically Signed   By: Drusilla Kanner M.D.   On: 07/21/2021 15:20   MM 3D SCREEN BREAST BILATERAL  Result Date: 07/23/2021 CLINICAL DATA:  Screening. EXAM: DIGITAL SCREENING BILATERAL MAMMOGRAM WITH TOMOSYNTHESIS AND CAD TECHNIQUE: Bilateral screening digital craniocaudal and mediolateral oblique mammograms were obtained. Bilateral screening digital breast tomosynthesis was performed. The images were evaluated with computer-aided detection. COMPARISON:  Previous exam(s). ACR Breast  Density Category b: There are scattered areas of fibroglandular density. FINDINGS: There are no findings suspicious for malignancy. IMPRESSION: No mammographic evidence of malignancy. A result letter of this screening mammogram will be mailed directly to the patient. RECOMMENDATION: Screening mammogram in one year. (Code:SM-B-01Y) BI-RADS CATEGORY  1: Negative. Electronically Signed   By: Hulan Saas M.D.   On: 07/23/2021 13:15  MM Outside Films Mammo  Result Date: 07/23/2021 This examination belongs to an outside facility and is stored here for comparison purposes only.  Contact the originating outside institution for any associated report or interpretation.   Recent Labs: Lab Results  Component Value Date   WBC 6.9 04/07/2021   HGB 12.3 04/07/2021   PLT 316 04/07/2021   NA 136 04/07/2021   K 3.6 04/07/2021   CL 104 04/07/2021  CO2 25 04/07/2021   GLUCOSE 108 (H) 04/07/2021   BUN 7 04/07/2021   CREATININE 0.74 04/07/2021   BILITOT 0.5 04/07/2021   ALKPHOS 83 04/07/2021   AST 20 04/07/2021   ALT 23 04/07/2021   PROT 7.5 04/07/2021   ALBUMIN 3.6 04/07/2021   CALCIUM 9.2 04/07/2021    Speciality Comments: No specialty comments available.  Procedures:  Hand/UE Inj: R extensor compartment 1 for de Quervain's tenosynovitis on 08/11/2021 12:04 PM Indications: pain and tendon swelling Details: 25 G needle, radial approach Medications: 0.5 mL lidocaine 1 %; 20 mg triamcinolone acetonide 40 MG/ML Outcome: tolerated well, no immediate complications Procedure, treatment alternatives, risks and benefits explained, specific risks discussed. Consent was given by the patient. Immediately prior to procedure a time out was called to verify the correct patient, procedure, equipment, support staff and site/side marked as required. Patient was prepped and draped in the usual sterile fashion.    Allergies: Patient has no known allergies.   Assessment / Plan:     Visit Diagnoses:  Seronegative inflammatory arthritis - Plan: hydroxychloroquine (PLAQUENIL) 200 MG tablet, Ambulatory referral to Ophthalmology  ANA by immunofluorescence since is low titer with an unusual pattern but can be consistent with a postinfectious/post COVID inflammatory process.  Recommend trial of starting hydroxychloroquine 400 mg daily for signs of a ongoing inflammatory arthritis.  Discussed risks of medications including arrhythmia, hypersensitivity reaction, and need for long-term monitoring of retinal toxicity.  Without a more clear diagnosis would not recommend other treatment escalation of immunosuppression at this time.  Referral to ophthalmology for retinal exam.  Plan to follow-up in about 8 weeks.  Pain and swelling of right wrist - Plan: Hand/UE Inj: R extensor compartment 1  Current right wrist pain and swelling is consistent with tenosynovitis and first dorsal compartment steroid injection performed today for current symptom relief with as needed follow-up instructions provided.  Orders: Orders Placed This Encounter  Procedures   Hand/UE Inj: R extensor compartment 1   Ambulatory referral to Ophthalmology    Meds ordered this encounter  Medications   hydroxychloroquine (PLAQUENIL) 200 MG tablet    Sig: Take 2 tablets (400 mg total) by mouth daily.    Dispense:  60 tablet    Refill:  2      Follow-Up Instructions: Return in about 2 months (around 10/11/2021) for Inflammatory arthritis HCQ start f/u 2mos.   Fuller Planhristopher W Tammie Ellsworth, MD  Note - This record has been created using [employer] software.  Chart creation errors have been sought, but may not always  have been located. Such creation errors do not reflect on  the standard of medical care.

## 2021-08-11 ENCOUNTER — Other Ambulatory Visit: Payer: 59

## 2021-08-11 ENCOUNTER — Other Ambulatory Visit: Payer: Self-pay

## 2021-08-11 ENCOUNTER — Ambulatory Visit: Payer: 59 | Admitting: Internal Medicine

## 2021-08-11 ENCOUNTER — Encounter: Payer: Self-pay | Admitting: Internal Medicine

## 2021-08-11 VITALS — BP 110/72 | HR 69 | Ht 64.5 in | Wt 251.6 lb

## 2021-08-11 DIAGNOSIS — Z1329 Encounter for screening for other suspected endocrine disorder: Secondary | ICD-10-CM | POA: Diagnosis not present

## 2021-08-11 DIAGNOSIS — M1380 Other specified arthritis, unspecified site: Secondary | ICD-10-CM

## 2021-08-11 DIAGNOSIS — M1990 Unspecified osteoarthritis, unspecified site: Secondary | ICD-10-CM

## 2021-08-11 DIAGNOSIS — Z136 Encounter for screening for cardiovascular disorders: Secondary | ICD-10-CM | POA: Diagnosis not present

## 2021-08-11 DIAGNOSIS — M25531 Pain in right wrist: Secondary | ICD-10-CM | POA: Diagnosis not present

## 2021-08-11 DIAGNOSIS — R768 Other specified abnormal immunological findings in serum: Secondary | ICD-10-CM

## 2021-08-11 DIAGNOSIS — M25431 Effusion, right wrist: Secondary | ICD-10-CM

## 2021-08-11 MED ORDER — TRIAMCINOLONE ACETONIDE 40 MG/ML IJ SUSP
20.0000 mg | INTRAMUSCULAR | Status: AC | PRN
  Administered 2021-08-11: 20 mg

## 2021-08-11 MED ORDER — HYDROXYCHLOROQUINE SULFATE 200 MG PO TABS
400.0000 mg | ORAL_TABLET | Freq: Every day | ORAL | 2 refills | Status: DC
Start: 2021-08-11 — End: 2021-10-11

## 2021-08-11 MED ORDER — LIDOCAINE HCL 1 % IJ SOLN
0.5000 mL | INTRAMUSCULAR | Status: AC | PRN
  Administered 2021-08-11: .5 mL

## 2021-08-12 LAB — COMPREHENSIVE METABOLIC PANEL
ALT: 27 IU/L (ref 0–32)
AST: 20 IU/L (ref 0–40)
Albumin/Globulin Ratio: 1.9 (ref 1.2–2.2)
Albumin: 4.6 g/dL (ref 3.8–4.9)
Alkaline Phosphatase: 78 IU/L (ref 44–121)
BUN/Creatinine Ratio: 14 (ref 9–23)
BUN: 16 mg/dL (ref 6–24)
Bilirubin Total: 0.4 mg/dL (ref 0.0–1.2)
CO2: 22 mmol/L (ref 20–29)
Calcium: 9.8 mg/dL (ref 8.7–10.2)
Chloride: 100 mmol/L (ref 96–106)
Creatinine, Ser: 1.13 mg/dL — ABNORMAL HIGH (ref 0.57–1.00)
Globulin, Total: 2.4 g/dL (ref 1.5–4.5)
Glucose: 108 mg/dL — ABNORMAL HIGH (ref 65–99)
Potassium: 4.5 mmol/L (ref 3.5–5.2)
Sodium: 141 mmol/L (ref 134–144)
Total Protein: 7 g/dL (ref 6.0–8.5)
eGFR: 57 mL/min/{1.73_m2} — ABNORMAL LOW (ref >59)

## 2021-08-12 LAB — CBC WITH DIFFERENTIAL/PLATELET
Basophils Absolute: 0.1 10*3/uL (ref 0.0–0.2)
Basos: 1 %
EOS (ABSOLUTE): 0.3 10*3/uL (ref 0.0–0.4)
Eos: 5 %
Hematocrit: 42.4 % (ref 34.0–46.6)
Hemoglobin: 13.9 g/dL (ref 11.1–15.9)
Immature Grans (Abs): 0 10*3/uL (ref 0.0–0.1)
Immature Granulocytes: 0 %
Lymphocytes Absolute: 2.5 10*3/uL (ref 0.7–3.1)
Lymphs: 39 %
MCH: 26.9 pg (ref 26.6–33.0)
MCHC: 32.8 g/dL (ref 31.5–35.7)
MCV: 82 fL (ref 79–97)
Monocytes Absolute: 0.5 10*3/uL (ref 0.1–0.9)
Monocytes: 8 %
Neutrophils Absolute: 2.9 10*3/uL (ref 1.4–7.0)
Neutrophils: 47 %
Platelets: 218 10*3/uL (ref 150–450)
RBC: 5.17 x10E6/uL (ref 3.77–5.28)
RDW: 14.4 % (ref 11.7–15.4)
WBC: 6.3 10*3/uL (ref 3.4–10.8)

## 2021-08-12 LAB — LIPID PANEL
Chol/HDL Ratio: 6.5 ratio — ABNORMAL HIGH (ref 0.0–4.4)
Cholesterol, Total: 323 mg/dL — ABNORMAL HIGH (ref 100–199)
HDL: 50 mg/dL (ref >39)
LDL Chol Calc (NIH): 197 mg/dL — ABNORMAL HIGH (ref 0–99)
Triglycerides: 376 mg/dL — ABNORMAL HIGH (ref 0–149)
VLDL Cholesterol Cal: 76 mg/dL — ABNORMAL HIGH (ref 5–40)

## 2021-08-12 LAB — THYROID PANEL WITH TSH
Free Thyroxine Index: 1.6 (ref 1.2–4.9)
T3 Uptake Ratio: 20 % — ABNORMAL LOW (ref 24–39)
T4, Total: 7.8 ug/dL (ref 4.5–12.0)
TSH: 6.59 u[IU]/mL — ABNORMAL HIGH (ref 0.450–4.500)

## 2021-08-16 ENCOUNTER — Encounter: Payer: Self-pay | Admitting: Nurse Practitioner

## 2021-08-16 ENCOUNTER — Ambulatory Visit: Payer: 59 | Admitting: Nurse Practitioner

## 2021-08-16 ENCOUNTER — Other Ambulatory Visit: Payer: Self-pay

## 2021-08-16 VITALS — BP 118/79 | HR 67 | Temp 98.6°F | Ht 64.49 in | Wt 248.6 lb

## 2021-08-16 DIAGNOSIS — I10 Essential (primary) hypertension: Secondary | ICD-10-CM | POA: Diagnosis not present

## 2021-08-16 DIAGNOSIS — E079 Disorder of thyroid, unspecified: Secondary | ICD-10-CM

## 2021-08-16 DIAGNOSIS — R768 Other specified abnormal immunological findings in serum: Secondary | ICD-10-CM

## 2021-08-16 DIAGNOSIS — R7989 Other specified abnormal findings of blood chemistry: Secondary | ICD-10-CM | POA: Diagnosis not present

## 2021-08-16 DIAGNOSIS — R748 Abnormal levels of other serum enzymes: Secondary | ICD-10-CM

## 2021-08-16 DIAGNOSIS — E782 Mixed hyperlipidemia: Secondary | ICD-10-CM

## 2021-08-16 MED ORDER — NALTREXONE HCL 50 MG PO TABS: 25.0000 mg | ORAL_TABLET | Freq: Every day | ORAL | 0 refills | Status: DC

## 2021-08-16 MED ORDER — LEVOTHYROXINE SODIUM 112 MCG PO TABS: 112.0000 ug | ORAL_TABLET | Freq: Every day | ORAL | 0 refills | Status: DC

## 2021-08-16 NOTE — Assessment & Plan Note (Signed)
Chronic, not controlled. TSH was elevated at 6.5. Will increase levothyroxine to daily. Continue collaboration with endocrine. Will recheck thyroid panel before her visit to endocrine on 09/28/21.

## 2021-08-16 NOTE — Progress Notes (Signed)
 Established Patient Office Visit  Subjective:  Patient ID: Bianca Michael, female    DOB: 12/23/1966  Age: 55 y.o. MRN: [id number]  CC:  Chief Complaint  Patient presents with   Hyperlipidemia   Anxiety   Hypothyroidism   Gastroesophageal Reflux   Joint Pain    Is seeing Rheum.     HPI Bianca Michael presents for follow up on joint pain, hypothyroidsim, and hyperlipidemia. Daughter, Bianca Michael, on phone during visit and voiced concerns regarding her kidney function. She states that when she had covid-19 in February and was in the hospital, she went into acute renal failure. For the last few months, her renal function has declined and she would like to get her mom into nephrology.   HYPERLIPIDEMIA Hyperlipidemia status: excellent compliance Satisfied with current treatment?  yes Side effects:  no Medication compliance: excellent compliance Past cholesterol meds: pravastatin (pravachol) Supplements: none Aspirin:  no The ASCVD Risk score (Goff DC Jr., et al., 2013) failed to calculate for the following reasons:   The valid total cholesterol range is 130 to 320 mg/dL Chest pain:  no Coronary artery disease:  no Family history CAD:  yes Family history early CAD:  no  HYPOTHYROID  Taking levothyroxine daily with no side effects. Endorses fatigue, however this is chronic. Denies shortness of breath and chest pain.   JOINT PAIN  Following with rheumatology. She was recently started on plaquenil. She states that this has already helped some of her joint pain, especially in her fingers. Denies any side effects.   OBESITY  She is already on wellbutrin and has taken naltrexone 50mg daily in addition this this to help with weight loss. She states that she would like to re-start naltrexone. Her diet is fairly poor, and she eats fatty/fried foods frequently.    Past Medical History:  Diagnosis Date   Anxiety    COVID-19 12/2020   Patient was hospitalized   Depression     Hyperlipidemia    Hypertension    Pneumothorax    Left lung   Pulmonary embolism (HCC)    Thyroid disease     Past Surgical History:  Procedure Laterality Date   CARPAL TUNNEL RELEASE     CHOLECYSTECTOMY     TONSILLECTOMY  1970   TUBAL LIGATION      Family History  Problem Relation Age of Onset   Breast cancer Mother 41   Colon cancer Mother    Heart attack Father    Heart disease Father    High Cholesterol Father    Thyroid disease Sister    Breast cancer Sister    Thyroid disease Sister    Thyroid disease Sister    Lupus Sister    Rheum arthritis Sister    Thyroid disease Daughter     Social History   Socioeconomic History   Marital status: Married    Spouse name: Not on file   Number of children: Not on file   Years of education: Not on file   Highest education level: Not on file  Occupational History   Not on file  Tobacco Use   Smoking status: Former    Packs/day: 0.50    Types: Cigarettes    Quit date: 12/26/2000    Years since quitting: 20.6   Smokeless tobacco: Never  Vaping Use   Vaping Use: Never used  Substance and Sexual Activity   Alcohol use: Not Currently   Drug use: Not Currently   Sexual activity: Not Currently    Other Topics Concern   Not on file  Social History Narrative   Not on file   Social Determinants of Health   Financial Resource Strain: Not on file  Food Insecurity: Not on file  Transportation Needs: Not on file  Physical Activity: Not on file  Stress: Not on file  Social Connections: Not on file  Intimate Partner Violence: Not on file    Outpatient Medications Prior to Visit  Medication Sig Dispense Refill   albuterol (VENTOLIN HFA) 108 (90 Base) MCG/ACT inhaler Inhale into the lungs.     Ascorbic Acid (VITAMIN C) 1000 MG tablet Take 1,000 mg by mouth daily.     buPROPion (WELLBUTRIN XL) 300 MG 24 hr tablet Take by mouth.     Cholecalciferol (VITAMIN D3 ULTRA STRENGTH PO) Take 5,000 Int'l Units/day by mouth.      halobetasol (ULTRAVATE) 0.05 % ointment Apply topically 2 (two) times daily.     hydroxychloroquine (PLAQUENIL) 200 MG tablet Take 2 tablets (400 mg total) by mouth daily. 60 tablet 2   hydrOXYzine (ATARAX/VISTARIL) 25 MG tablet Take by mouth.     metoprolol tartrate (LOPRESSOR) 25 MG tablet Take by mouth.     Multiple Vitamins-Minerals (WOMENS MULTI) CAPS Take 1 capsule by mouth daily.     omeprazole (PRILOSEC) 40 MG capsule Take 40 mg by mouth daily.     pravastatin (PRAVACHOL) 10 MG tablet Take by mouth.     rivaroxaban (XARELTO) 20 MG TABS tablet Take 1 tablet (20 mg total) by mouth daily with supper. 30 tablet 1   sertraline (ZOLOFT) 25 MG tablet Take by mouth.     TH MAGNESIUM PO Take 450 mg by mouth daily.     zinc gluconate 50 MG tablet Take 50 mg by mouth daily.     levothyroxine (SYNTHROID) 100 MCG tablet Take by mouth.     No facility-administered medications prior to visit.    No Known Allergies  ROS Review of Systems  Constitutional:  Positive for fatigue.  Respiratory: Negative.    Cardiovascular: Negative.   Gastrointestinal: Negative.   Genitourinary: Negative.   Musculoskeletal:  Positive for arthralgias (improving with plaquenil).  Skin: Negative.   Neurological: Negative.      Objective:    Physical Exam Vitals and nursing note reviewed.  Constitutional:      General: She is not in acute distress.    Appearance: Normal appearance.  HENT:     Head: Normocephalic and atraumatic.  Eyes:     Conjunctiva/sclera: Conjunctivae normal.  Cardiovascular:     Rate and Rhythm: Normal rate and regular rhythm.     Pulses: Normal pulses.     Heart sounds: Normal heart sounds.  Pulmonary:     Effort: Pulmonary effort is normal.     Breath sounds: Normal breath sounds.  Abdominal:     Palpations: Abdomen is soft.     Tenderness: no abdominal tenderness  Musculoskeletal:     Cervical back: Normal range of motion.     Right lower leg: No edema.     Left lower  leg: No edema.  Skin:    General: Skin is warm and dry.  Neurological:     General: No focal deficit present.     Mental Status: She is alert and oriented to person, place, and time.  Psychiatric:        Mood and Affect: Mood normal.        Behavior: Behavior normal.          Thought Content: Thought content normal.        Judgment: Judgment normal.    BP 118/79   Pulse 67   Temp 98.6 F (37 C) (Oral)   Ht 5' 4.49" (1.638 m)   Wt 248 lb 9.6 oz (112.8 kg)   SpO2 96%   BMI 42.03 kg/m  Wt Readings from Last 3 Encounters:  08/16/21 248 lb 9.6 oz (112.8 kg)  08/11/21 251 lb 9.6 oz (114.1 kg)  07/26/21 248 lb (112.5 kg)     Health Maintenance Due  Topic Date Due   INFLUENZA VACCINE  07/26/2021    There are no preventive care reminders to display for this patient.  Lab Results  Component Value Date   TSH 6.590 (H) 08/11/2021   Lab Results  Component Value Date   WBC 6.3 08/11/2021   HGB 13.9 08/11/2021   HCT 42.4 08/11/2021   MCV 82 08/11/2021   PLT 218 08/11/2021   Lab Results  Component Value Date   NA 141 08/11/2021   K 4.5 08/11/2021   CO2 22 08/11/2021   GLUCOSE 108 (H) 08/11/2021   BUN 16 08/11/2021   CREATININE 1.13 (H) 08/11/2021   BILITOT 0.4 08/11/2021   ALKPHOS 78 08/11/2021   AST 20 08/11/2021   ALT 27 08/11/2021   PROT 7.0 08/11/2021   ALBUMIN 4.6 08/11/2021   CALCIUM 9.8 08/11/2021   ANIONGAP 7 04/07/2021   EGFR 57 (L) 08/11/2021   Lab Results  Component Value Date   CHOL 323 (H) 08/11/2021   Lab Results  Component Value Date   HDL 50 08/11/2021   Lab Results  Component Value Date   LDLCALC 197 (H) 08/11/2021   Lab Results  Component Value Date   TRIG 376 (H) 08/11/2021   Lab Results  Component Value Date   CHOLHDL 6.5 (H) 08/11/2021   No results found for: HGBA1C    Assessment & Plan:   Problem List Items Addressed This Visit       Cardiovascular and Mediastinum   Essential (primary) hypertension    Chronic, stable.  Continue current regimen. CMP and CBC reviewed. Follow-up in 6 months.         Endocrine   Thyroid disease - Primary    Chronic, not controlled. TSH was elevated at 6.5. Will increase levothyroxine to 155mg daily. Continue collaboration with endocrine. Will recheck thyroid panel before her visit to endocrine on 09/28/21.       Relevant Medications   levothyroxine (SYNTHROID) 112 MCG tablet   Other Relevant Orders   Thyroid Panel With TSH     Other   Positive ANA (antinuclear antibody)    Continue collaboration with rheumatology. Symptoms are improving on plaquenil.       Elevated liver enzymes    Liver enzymes were elevated when hospitalized with covid-19 in February 2022. Recheck shows liver enzymes back in normal range.       Hyperlipidemia    Chronic, not well controlled. Total cholesterol 323 and LDL 197. She is taking pravastatin 130mdaily. Discussed increasing pravastatin dose, however patient was extremely hesitant and states she is sensitive to medications. Encouraged her to adjust her diet and to decrease the amount of fried/fatty foods she is eating. Information printed for patient on diet as well.      Elevated serum creatinine    GFR 57 2 months ago, along with last week. Her daughter is concerned as she had acute kidney injury in the hospital in February 2022  and needed dialysis. Will place referral to nephrology.       Relevant Orders   Ambulatory referral to Nephrology   Morbid obesity (HCC)    BMI 42. She has taken naltrexone separately with her wellbutrin in the past that has helped with her appetite and decrease cravings. She was taking 50mg daily. Will re-start her on 25mg of naltrexone daily due to kidney function. Discussed diet and exercise. Follow-up in 4-6 weeks.        Meds ordered this encounter  Medications   levothyroxine (SYNTHROID) 112 MCG tablet    Sig: Take 1 tablet (112 mcg total) by mouth daily.    Dispense:  90 tablet    Refill:  0    naltrexone (DEPADE) 50 MG tablet    Sig: Take 0.5 tablets (25 mg total) by mouth daily.    Dispense:  30 tablet    Refill:  0    Follow-up: Return in about 4 weeks (around 09/13/2021) for 4-6 weeks for weight.    Lauren A McElwee, NP 

## 2021-08-16 NOTE — Assessment & Plan Note (Signed)
Continue collaboration with rheumatology. Symptoms are improving on plaquenil.

## 2021-08-16 NOTE — Assessment & Plan Note (Signed)
GFR 57 2 months ago, along with last week. Her daughter is concerned as she had acute kidney injury in the hospital in February 2022 and needed dialysis. Will place referral to nephrology.

## 2021-08-16 NOTE — Patient Instructions (Signed)
Get your labs drawn on approximately 09/20/21

## 2021-08-16 NOTE — Assessment & Plan Note (Signed)
Chronic, stable. Continue current regimen. CMP and CBC reviewed. Follow-up in 6 months.

## 2021-08-16 NOTE — Assessment & Plan Note (Addendum)
Chronic, not well controlled. Total cholesterol 323 and LDL 197. She is taking pravastatin 10mg  daily. Discussed increasing pravastatin dose, however patient was extremely hesitant and states she is sensitive to medications. Encouraged her to adjust her diet and to decrease the amount of fried/fatty foods she is eating. Information printed for patient on diet as well.

## 2021-08-16 NOTE — Assessment & Plan Note (Signed)
BMI 42. She has taken naltrexone separately with her wellbutrin in the past that has helped with her appetite and decrease cravings. She was taking 50mg  daily. Will re-start her on 25mg  of naltrexone daily due to kidney function. Discussed diet and exercise. Follow-up in 4-6 weeks.

## 2021-08-16 NOTE — Assessment & Plan Note (Signed)
Liver enzymes were elevated when hospitalized with covid-19 in February 2022. Recheck shows liver enzymes back in normal range.

## 2021-08-18 ENCOUNTER — Ambulatory Visit: Payer: 59 | Admitting: Internal Medicine

## 2021-08-19 ENCOUNTER — Encounter: Payer: Self-pay | Admitting: Oncology

## 2021-08-19 ENCOUNTER — Inpatient Hospital Stay: Payer: 59 | Attending: Oncology | Admitting: Oncology

## 2021-08-19 ENCOUNTER — Inpatient Hospital Stay: Payer: 59

## 2021-08-19 ENCOUNTER — Other Ambulatory Visit: Payer: Self-pay

## 2021-08-19 ENCOUNTER — Telehealth: Payer: Self-pay

## 2021-08-19 VITALS — BP 110/75 | HR 68 | Temp 97.8°F | Resp 18 | Wt 245.2 lb

## 2021-08-19 DIAGNOSIS — Z803 Family history of malignant neoplasm of breast: Secondary | ICD-10-CM | POA: Insufficient documentation

## 2021-08-19 DIAGNOSIS — R0600 Dyspnea, unspecified: Secondary | ICD-10-CM | POA: Diagnosis not present

## 2021-08-19 DIAGNOSIS — U099 Post covid-19 condition, unspecified: Secondary | ICD-10-CM | POA: Diagnosis not present

## 2021-08-19 DIAGNOSIS — Z86711 Personal history of pulmonary embolism: Secondary | ICD-10-CM | POA: Diagnosis not present

## 2021-08-19 DIAGNOSIS — Z87891 Personal history of nicotine dependence: Secondary | ICD-10-CM | POA: Insufficient documentation

## 2021-08-19 DIAGNOSIS — Z7901 Long term (current) use of anticoagulants: Secondary | ICD-10-CM | POA: Diagnosis not present

## 2021-08-19 DIAGNOSIS — I2699 Other pulmonary embolism without acute cor pulmonale: Secondary | ICD-10-CM

## 2021-08-19 DIAGNOSIS — Z79899 Other long term (current) drug therapy: Secondary | ICD-10-CM | POA: Diagnosis not present

## 2021-08-19 DIAGNOSIS — M1990 Unspecified osteoarthritis, unspecified site: Secondary | ICD-10-CM

## 2021-08-19 DIAGNOSIS — R0609 Other forms of dyspnea: Secondary | ICD-10-CM

## 2021-08-19 LAB — CBC WITH DIFFERENTIAL/PLATELET
Abs Immature Granulocytes: 0.03 10*3/uL (ref 0.00–0.07)
Basophils Absolute: 0.1 10*3/uL (ref 0.0–0.1)
Basophils Relative: 1 %
Eosinophils Absolute: 0.3 10*3/uL (ref 0.0–0.5)
Eosinophils Relative: 5 %
HCT: 44.2 % (ref 36.0–46.0)
Hemoglobin: 14.6 g/dL (ref 12.0–15.0)
Immature Granulocytes: 1 %
Lymphocytes Relative: 36 %
Lymphs Abs: 2.2 10*3/uL (ref 0.7–4.0)
MCH: 27.2 pg (ref 26.0–34.0)
MCHC: 33 g/dL (ref 30.0–36.0)
MCV: 82.5 fL (ref 80.0–100.0)
Monocytes Absolute: 0.4 10*3/uL (ref 0.1–1.0)
Monocytes Relative: 7 %
Neutro Abs: 3.2 10*3/uL (ref 1.7–7.7)
Neutrophils Relative %: 50 %
Platelets: 214 10*3/uL (ref 150–400)
RBC: 5.36 MIL/uL — ABNORMAL HIGH (ref 3.87–5.11)
RDW: 14.2 % (ref 11.5–15.5)
WBC: 6.3 10*3/uL (ref 4.0–10.5)
nRBC: 0 % (ref 0.0–0.2)

## 2021-08-19 LAB — COMPREHENSIVE METABOLIC PANEL
ALT: 36 U/L (ref 0–44)
AST: 33 U/L (ref 15–41)
Albumin: 4.2 g/dL (ref 3.5–5.0)
Alkaline Phosphatase: 69 U/L (ref 38–126)
Anion gap: 8 (ref 5–15)
BUN: 19 mg/dL (ref 6–20)
CO2: 26 mmol/L (ref 22–32)
Calcium: 9.3 mg/dL (ref 8.9–10.3)
Chloride: 101 mmol/L (ref 98–111)
Creatinine, Ser: 1.27 mg/dL — ABNORMAL HIGH (ref 0.44–1.00)
GFR, Estimated: 49 mL/min — ABNORMAL LOW (ref >60)
Glucose, Bld: 107 mg/dL — ABNORMAL HIGH (ref 70–99)
Potassium: 4.5 mmol/L (ref 3.5–5.1)
Sodium: 135 mmol/L (ref 135–145)
Total Bilirubin: 0.9 mg/dL (ref 0.3–1.2)
Total Protein: 8.1 g/dL (ref 6.5–8.1)

## 2021-08-19 NOTE — Telephone Encounter (Signed)
Copied from CRM [id number]. Topic: General - Inquiry >> Aug 19, 2021  2:33 PM Daphine Deutscher D wrote: Reason for CRM: pt called saying the nephrologist office called her saying it would be th end of Sept before they can see her.  They want know if another practice can see her perhaps [facility]  CB# [phone]  Brayton Caves can we send the referral somewhere else please?

## 2021-08-19 NOTE — Progress Notes (Signed)
Pt states she has covid toes and lost all of her toenails; nerves are repairing and toes cause sharp and throbbing pain.

## 2021-08-19 NOTE — Progress Notes (Signed)
[facility]  [address] [place], [place] [address] Phone: [phone]  Fax: [phone]   Clinic Day:  08/19/2021  Referring physician: Ladonna Snide, DO  Chief Complaint: Bianca Michael is a 55 y.o. female presents for history of pulmonary emboli s/p COVID   PERTINENT ONCOLOGY HISTORY  Patient previously followed up by Dr.Corcoran, patient switched care to me on 07/08/21 Extensive medical record review was performed by me  Chronic history of morbid obesity and Hashimoto's thyroiditis.  01/21/2021 [facility] hospitalization due to respiratory distress secondary to COVID-19 pneumonia. Patient was intubated around 01/24/2021. tocilizumab on 01/22/2021. Transferred to [facility] from 01/28/2021 - 03/18/2021. She stayed on a ventilator until the beginning of March.  01/26/2021 CT angio pulmonary showed acute left lower lobe pulmonary embolus and a new 2.5 cm right ventricular luminal thrombus.  No CT evidence of elevated right heart pressure.  Worsening severe diffuse groundglass opacity consistent with COVID-19 pneumonia.  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 She required PRBC transfusions. She was transferred out of the ICU on 02/26/2021. Heparin was transitioned to Eliquis. She continued to improve and was weaned off of oxygen. PEG was removed on the day of discharge. She was referred to outpatient PT and OT. She was discharged on Eliquis.  04/07/2021, establish care with Dr. Merlene Pulling. Work-up on 04/07/2021 revealed a hematocrit of 38.2, hemoglobin 12.3, platelets 316,000, WBC 6,900 with an ANC of 3700. CMP was normal. Lupus  anticoagulant panel was negative.  Beta-2 glycoprotein IgG was 60 (0-20), IgM < 9, and IgA < 9. Cardiolipin antibodies were normal. Factor 5 Leiden and prothrombin gene mutation are negative Patient was recommended to switch to Xarelto due to insurance coverage.   Family history Her mother and sister had breast cancer. Her maternal aunt had ovarian cancer. Her three sisters all had thyroid issues. One of her sisters has lupus.   INTERVAL HISTORY Bianca Michael is a 55 y.o. female who has above history reviewed by me today presents for follow up visit for history of pulmonary embolism.  Elevated beta 2 glycoprotein IgG. Problems and complaints are listed below:  Takes Xarelto 20 mg daily. Patient has some dyspnea with exertion.  Otherwise she reports feeling well.  Chronic arthralgia.  She she does some exercise at home.   Past Medical History:  Diagnosis Date   Anxiety    COVID-19 12/2020   Patient was hospitalized   Depression    Hyperlipidemia    Hypertension    Pneumothorax    Left lung   Pulmonary embolism (HCC)    Thyroid disease     Past Surgical History:  Procedure Laterality Date   CARPAL TUNNEL RELEASE     CHOLECYSTECTOMY     TONSILLECTOMY  1970   TUBAL LIGATION      Family History  Problem Relation Age of Onset   Breast cancer Mother 89   Colon cancer Mother    Heart attack Father    Heart disease Father    High Cholesterol Father    Thyroid disease Sister    Breast cancer Sister    Thyroid disease Sister    Thyroid disease Sister    Lupus Sister    Rheum arthritis Sister    Thyroid  disease Daughter     Social History:  reports that she quit smoking about 20 years ago. Her smoking use included cigarettes. She smoked an average of .5 packs per day. She has never used smokeless tobacco. She reports that she does not currently use alcohol. She reports that she does not currently use drugs. She denies any alcohol and tobacco use. She denies any exposure  to radiation or toxins. She is married. Her daughter is Mikele. She worked at [employer] but recently moved. She lives in [place].   Allergies: No Known Allergies  Current Medications: Current Outpatient Medications  Medication Sig Dispense Refill   albuterol (VENTOLIN HFA) 108 (90 Base) MCG/ACT inhaler Inhale into the lungs.     Ascorbic Acid (VITAMIN C) 1000 MG tablet Take 1,000 mg by mouth daily.     buPROPion (WELLBUTRIN XL) 300 MG 24 hr tablet Take by mouth.     Cholecalciferol (VITAMIN D3 ULTRA STRENGTH PO) Take 5,000 Int'l Units/day by mouth.     halobetasol (ULTRAVATE) 0.05 % ointment Apply topically 2 (two) times daily.     hydroxychloroquine (PLAQUENIL) 200 MG tablet Take 2 tablets (400 mg total) by mouth daily. 60 tablet 2   hydrOXYzine (ATARAX/VISTARIL) 25 MG tablet Take by mouth.     levothyroxine (SYNTHROID) 112 MCG tablet Take 1 tablet (112 mcg total) by mouth daily. 90 tablet 0   metoprolol tartrate (LOPRESSOR) 25 MG tablet Take by mouth.     Multiple Vitamins-Minerals (WOMENS MULTI) CAPS Take 1 capsule by mouth daily.     naltrexone (DEPADE) 50 MG tablet Take 0.5 tablets (25 mg total) by mouth daily. 30 tablet 0   omeprazole (PRILOSEC) 40 MG capsule Take 40 mg by mouth daily.     pravastatin (PRAVACHOL) 10 MG tablet Take by mouth.     rivaroxaban (XARELTO) 20 MG TABS tablet Take 1 tablet (20 mg total) by mouth daily with supper. 30 tablet 1   sertraline (ZOLOFT) 25 MG tablet Take by mouth.     TH MAGNESIUM PO Take 450 mg by mouth daily.     zinc gluconate 50 MG tablet Take 50 mg by mouth daily.     No current facility-administered medications for this visit.    Review of Systems  Constitutional:  Negative for chills, diaphoresis, fever, malaise/fatigue and weight loss.  HENT:  Negative for congestion, ear discharge, ear pain, hearing loss, nosebleeds, sinus pain, sore throat and tinnitus.        Sinus drainage. Runny nose.  Eyes:  Negative for blurred vision.   Respiratory:  Positive for shortness of breath (with talking and exertion). Negative for cough, hemoptysis and sputum production.   Cardiovascular:  Negative for chest pain (one episode this morning), palpitations and leg swelling.  Gastrointestinal:  Negative for abdominal pain, blood in stool, constipation, diarrhea, heartburn, melena, nausea (in the mornings) and vomiting.  Genitourinary:  Negative for dysuria, frequency, hematuria and urgency.       Bladder pain  Musculoskeletal:  Positive for back pain and joint pain (elbows and shoulders, arthritis). Negative for myalgias and neck pain.  Skin:  Negative for itching and rash.       "COVID toes"  Neurological:  Positive for sensory change (hand and buttock numbness) and headaches. Negative for dizziness, tingling and weakness.  Endo/Heme/Allergies:  Positive for environmental allergies. Does not bruise/bleed easily.  Psychiatric/Behavioral:  Negative for depression and memory loss. The patient is not nervous/anxious and does not have insomnia.   All other  systems reviewed and are negative. Performance status (ECOG): 1-2  Vitals Blood pressure 110/75, pulse 68, temperature 97.8 F (36.6 C), resp. rate 18, weight 245 lb 2.4 oz (111.2 kg), SpO2 97 %.   Physical Exam Vitals and nursing note reviewed.  Constitutional:      General: She is not in acute distress.    Appearance: She is obese. She is not diaphoretic.  HENT:     Head: Normocephalic and atraumatic.     Mouth/Throat:     Mouth: Mucous membranes are moist.     Pharynx: Oropharynx is clear.  Eyes:     General: No scleral icterus.    Pupils: Pupils are equal, round, and reactive to light.  Cardiovascular:     Rate and Rhythm: Normal rate and regular rhythm.     Heart sounds: Normal heart sounds. No murmur heard. Pulmonary:     Effort: Pulmonary effort is normal. No respiratory distress.     Breath sounds: Normal breath sounds. No wheezing or rales.  Chest:     Chest  wall: No tenderness.  Abdominal:     General: Bowel sounds are normal. There is no distension.     Palpations: Abdomen is soft. There is no mass.     Tenderness: There is no abdominal tenderness. There is no guarding or rebound.  Musculoskeletal:        General: Normal range of motion.     Cervical back: Normal range of motion and neck supple.     Comments: Trace edema bilaterally.  Lymphadenopathy:     Head:     Right side of head: No preauricular, posterior auricular or occipital adenopathy.     Left side of head: No preauricular, posterior auricular or occipital adenopathy.     Cervical: No cervical adenopathy.     Upper Body:     Right upper body: No supraclavicular or axillary adenopathy.     Left upper body: No supraclavicular or axillary adenopathy.     Lower Body: No right inguinal adenopathy. No left inguinal adenopathy.  Skin:    General: Skin is warm and dry.     Comments: Well healed scarring from tracheostomy and PEG tube.    Neurological:     Mental Status: She is alert and oriented to person, place, and time.  Psychiatric:        Mood and Affect: Mood normal.    Laboratory data  Appointment on 08/19/2021  Component Date Value Ref Range Status   WBC 08/19/2021 6.3  4.0 - 10.5 K/uL Final   RBC 08/19/2021 5.36 (A) 3.87 - 5.11 MIL/uL Final   Hemoglobin 08/19/2021 14.6  12.0 - 15.0 g/dL Final   HCT 16/10/960408/25/2022 44.2  36.0 - 46.0 % Final   MCV 08/19/2021 82.5  80.0 - 100.0 fL Final   MCH 08/19/2021 27.2  26.0 - 34.0 pg Final   MCHC 08/19/2021 33.0  30.0 - 36.0 g/dL Final   RDW 54/09/811908/25/2022 14.2  11.5 - 15.5 % Final   Platelets 08/19/2021 214  150 - 400 K/uL Final   nRBC 08/19/2021 0.0  0.0 - 0.2 % Final   Neutrophils Relative % 08/19/2021 50  % Final   Neutro Abs 08/19/2021 3.2  1.7 - 7.7 K/uL Final   Lymphocytes Relative 08/19/2021 36  % Final   Lymphs Abs 08/19/2021 2.2  0.7 - 4.0 K/uL Final   Monocytes Relative 08/19/2021 7  % Final   Monocytes Absolute  08/19/2021 0.4  0.1 - 1.0 K/uL  Final   Eosinophils Relative 08/19/2021 5  % Final   Eosinophils Absolute 08/19/2021 0.3  0.0 - 0.5 K/uL Final   Basophils Relative 08/19/2021 1  % Final   Basophils Absolute 08/19/2021 0.1  0.0 - 0.1 K/uL Final   Immature Granulocytes 08/19/2021 1  % Final   Abs Immature Granulocytes 08/19/2021 0.03  0.00 - 0.07 K/uL Final   Performed at [facility], [address], [place], [place] [address]   Sodium 08/19/2021 135  135 - 145 mmol/L Final   Potassium 08/19/2021 4.5  3.5 - 5.1 mmol/L Final   Chloride 08/19/2021 101  98 - 111 mmol/L Final   CO2 08/19/2021 26  22 - 32 mmol/L Final   Glucose, Bld 08/19/2021 107 (A) 70 - 99 mg/dL Final   Glucose reference range applies only to samples taken after fasting for at least 8 hours.   BUN 08/19/2021 19  6 - 20 mg/dL Final   Creatinine, Ser 08/19/2021 1.27 (A) 0.44 - 1.00 mg/dL Final   Calcium 09/47/0962 9.3  8.9 - 10.3 mg/dL Final   Total Protein 83/66/2947 8.1  6.5 - 8.1 g/dL Final   Albumin 65/46/5035 4.2  3.5 - 5.0 g/dL Final   AST 46/56/8127 33  15 - 41 U/L Final   ALT 08/19/2021 36  0 - 44 U/L Final   Alkaline Phosphatase 08/19/2021 69  38 - 126 U/L Final   Total Bilirubin 08/19/2021 0.9  0.3 - 1.2 mg/dL Final   GFR, Estimated 08/19/2021 49 (A) >60 mL/min Final   Comment: (NOTE) Calculated using the CKD-EPI Creatinine Equation (2021)    Anion gap 08/19/2021 8  5 - 15 Final   Performed at [facility], [address] [place], [place] [address]    Assessment and plan.  1. History of pulmonary embolism   2. Dyspnea on exertion   3. Arthritis     #Provoked pulmonary embolus associated with COVID-19 pneumonia. Patient is finishing 6 months plus anticoagulation. She is doing well clinically. Previous work-up showed Normal total protein C and S level.  Normal Antithrombin III level She just refilled a new prescription of Xarelto 20 mg daily.  Advised patient to finish the  current prescription which she probably has another 3-week supply.  I was planning on Xarelto 10 mg for maintenance during last visit.  This visits, patient appears to have improved further. I recommend her to discontinue Xarelto once she finishes current supply. Repeat beta-2 glycoprotein IgG level 2 weeks after she stopped Xarelto.  If negative, she may remain off anticoagulation.  Obesity, urged lifestyle modification.  #Dyspnea with exertion. Possible post COVID 19 complications, deconditioning and obesity. Refer patient to establish care with pulmonology for further evaluation.  # Joint pain, possible arthritis. Recommend Tylenol PRN and topical Voltaren cream #Health maintenance, bilateral mammogram showed no findings of suspicious malignancy.   I discussed the assessment and treatment plan with the patient.  The patient was provided an opportunity to ask questions and all were answered.  The patient agreed with the plan and demonstrated an understanding of the instructions.  The patient was advised to call back if the symptoms worsen or if the condition fails to improve as anticipated.  Rickard Patience, MD, PhD Hematology Oncology [facility] 08/19/2021

## 2021-08-23 NOTE — Telephone Encounter (Signed)
Called and LVM letting patient know that her referral has been sent to [facility] as requested.

## 2021-08-27 DIAGNOSIS — M1990 Unspecified osteoarthritis, unspecified site: Secondary | ICD-10-CM | POA: Diagnosis not present

## 2021-08-27 DIAGNOSIS — Z79899 Other long term (current) drug therapy: Secondary | ICD-10-CM | POA: Diagnosis not present

## 2021-09-06 ENCOUNTER — Ambulatory Visit: Payer: 59 | Admitting: Pulmonary Disease

## 2021-09-06 ENCOUNTER — Other Ambulatory Visit: Payer: Self-pay

## 2021-09-06 ENCOUNTER — Other Ambulatory Visit
Admission: RE | Admit: 2021-09-06 | Discharge: 2021-09-06 | Disposition: A | Discharge status: Home / Self Care | Payer: 59 | Source: Ambulatory Visit | Attending: Pulmonary Disease | Admitting: Pulmonary Disease

## 2021-09-06 ENCOUNTER — Encounter: Payer: Self-pay | Admitting: Pulmonary Disease

## 2021-09-06 VITALS — BP 116/74 | HR 86 | Temp 97.9°F | Ht 64.0 in | Wt 251.0 lb

## 2021-09-06 DIAGNOSIS — R0602 Shortness of breath: Secondary | ICD-10-CM

## 2021-09-06 DIAGNOSIS — Z86711 Personal history of pulmonary embolism: Secondary | ICD-10-CM

## 2021-09-06 DIAGNOSIS — Z8616 Personal history of COVID-19: Secondary | ICD-10-CM | POA: Diagnosis not present

## 2021-09-06 DIAGNOSIS — Z01812 Encounter for preprocedural laboratory examination: Secondary | ICD-10-CM | POA: Diagnosis not present

## 2021-09-06 DIAGNOSIS — J8410 Pulmonary fibrosis, unspecified: Secondary | ICD-10-CM

## 2021-09-06 DIAGNOSIS — Z20822 Contact with and (suspected) exposure to covid-19: Secondary | ICD-10-CM | POA: Insufficient documentation

## 2021-09-06 NOTE — Progress Notes (Signed)
Subjective:    Patient ID: Bianca Michael, female    DOB: [birth year], 55 y.o.   MRN: [id number] Chief Complaint  Patient presents with   pulmonary consult    Hx of covid- required admission. C/o sob with exertion.    HPI Patient is a 55 year old remote former smoker (15 PY) who presents for evaluation of dyspnea in the setting of prior COVID-19 infection.  The patient also has a history of morbid obesity and Hashimoto's thyroiditis.  Her history is quite complex on 21 January 2021 she was admitted at [facility] in [place] due to acute respiratory distress syndrome associated with COVID-19 pneumonia.  She was intubated on 24 January 2021 and transferred to [facility] on 28 January 2021 where she remained until 18 March 2021.  She was on a ventilator until the beginning of March and required tracheostomy on 10 February 2021 and PEG placement on 16 February 2021.  A CT angio performed 26 January 2021 showed pulmonary emboli.  She also had diffuse groundglass opacity consistent with COVID-19.  She has been on anticoagulation since and is currently completing Xarelto anticoagulation.  During the course of her hospitalization she also required CRRT after her creatinine reached a peak of 7.59.  The patient also had a pneumothorax requiring chest tube placement.  Her connective tissue disease work-up has been negative.  Thrombophilia work-up also has been negative.  During her hospitalization she required heavy doses of vasopressors and did develop toe ischemia and loss of toenails.  The patient notes that she has difficulty taking a breath then.  She has noted this after decannulation from tracheostomy.  She notices occasional "wheezing "and she points to her upper airway when this occurs.  She has had dyspnea on exertion.  Chest x-ray performed on 19 April 2021 showed bronchial thickening and streaky opacity at the left lung base, I have reviewed this  and agree with the findings.  Patient has not had any weight loss or anorexia.  No fevers, chills or sweats.  No productive cough no hemoptysis.  Weight has been stable.  She does have issues with gastroesophageal reflux however she takes omeprazole with relief of the symptoms.  She also uses Ventolin on occasion however does not note that this really helps.  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  She notes that Plaquenil has helped with her joint pains.  No other complaints voiced.  She has no military history, previously has resided in [place].  Review of Systems A 10 point review of systems was performed and it is as noted above otherwise negative.  Past Medical History:  Diagnosis Date   Anxiety    COVID-19 12/2020   Patient was hospitalized   Depression    Hyperlipidemia    Hypertension    Pneumothorax    Left lung   Pulmonary embolism (HCC)    Thyroid disease    Past Surgical History:  Procedure Laterality Date   CARPAL TUNNEL RELEASE     CHOLECYSTECTOMY     TONSILLECTOMY  1970   TUBAL LIGATION     Family History  Problem Relation Age of Onset   Breast cancer Mother 37   Colon cancer Mother    Heart attack Father    Heart disease Father    High Cholesterol Father    Thyroid disease Sister    Breast cancer Sister    Thyroid disease Sister  Thyroid disease Sister    Lupus Sister    Rheum arthritis Sister    Thyroid disease Daughter    Social History   Tobacco Use   Smoking status: Former    Packs/day: 1.00    Years: 15.00    Pack years: 15.00    Types: Cigarettes    Quit date: 12/26/2000    Years since quitting: 20.7   Smokeless tobacco: Never  Substance Use Topics   Alcohol use: Not Currently   No Known Allergies Current Meds  Medication Sig   albuterol (VENTOLIN HFA) 108 (90 Base) MCG/ACT inhaler Inhale into the lungs.   Ascorbic Acid (VITAMIN C) 1000 MG tablet Take 1,000 mg by mouth  daily.   buPROPion (WELLBUTRIN XL) 300 MG 24 hr tablet Take by mouth.   Cholecalciferol (VITAMIN D3 ULTRA STRENGTH PO) Take 5,000 Int'l Units/day by mouth.   halobetasol (ULTRAVATE) 0.05 % ointment Apply topically 2 (two) times daily.   hydroxychloroquine (PLAQUENIL) 200 MG tablet Take 2 tablets (400 mg total) by mouth daily.   hydrOXYzine (ATARAX/VISTARIL) 25 MG tablet Take by mouth.   levothyroxine (SYNTHROID) 112 MCG tablet Take 1 tablet (112 mcg total) by mouth daily.   metoprolol tartrate (LOPRESSOR) 25 MG tablet Take by mouth.   Multiple Vitamins-Minerals (WOMENS MULTI) CAPS Take 1 capsule by mouth daily.   naltrexone (DEPADE) 50 MG tablet Take 0.5 tablets (25 mg total) by mouth daily.   omeprazole (PRILOSEC) 40 MG capsule Take 40 mg by mouth daily.   pravastatin (PRAVACHOL) 10 MG tablet Take by mouth.   rivaroxaban (XARELTO) 20 MG TABS tablet Take 1 tablet (20 mg total) by mouth daily with supper.   sertraline (ZOLOFT) 25 MG tablet Take by mouth.   TH MAGNESIUM PO Take 450 mg by mouth daily.   zinc gluconate 50 MG tablet Take 50 mg by mouth daily.    There is no immunization history on file for this patient.      Objective:   Physical Exam BP 116/74 (BP Location: Left Arm, Cuff Size: Large)   Pulse 86   Temp 97.9 F (36.6 C) (Temporal)   Ht 5\' 4"  (1.626 m)   Wt 251 lb (113.9 kg)   SpO2 95%   BMI 43.08 kg/m  GENERAL: Obese woman, no acute distress, fully ambulatory, raspy voice, no conversational dyspnea. HEAD: Normocephalic, atraumatic.  EYES: Pupils equal, round, reactive to light.  No scleral icterus.  MOUTH: Nose/mouth/throat not examined due to masking requirements for COVID 19. NECK: Supple. No thyromegaly. Trachea midline. No JVD.  No adenopathy.  Well-healed tracheostomy scar PULMONARY: Good air entry bilaterally.  No adventitious sounds. CARDIOVASCULAR: S1 and S2. Regular rate and rhythm.  No rubs, murmurs or gallops heard. ABDOMEN: Obese, well-healed PEG  scar. MUSCULOSKELETAL: No joint deformity, no clubbing, no edema.  NEUROLOGIC: No focal deficit, no gait disturbance, speech is fluent. SKIN: Intact,warm,dry.  Mild discoloration of the toes distally, no frank ischemia.  Regrowing toenails. PSYCH: Mood and behavior normal     Assessment & Plan:     ICD-10-CM   1. 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 History of pulmonary embolus (PE)  Z86.711    This issue adds complexity to her management She is completing Xarelto therapy    4. Personal history of COVID-19  Z86.16    This issue adds complexity to her management     Orders Placed This Encounter  Procedures   CT Chest High Resolution    Standing Status:   Future    Standing Expiration Date:   09/06/2022    Order Specific Question:   Is patient pregnant?    Answer:   No    Order Specific Question:   Preferred imaging location?    Answer:   [facility]   Pulmonary Function Test [facility] Only    Standing Status:   Future    Standing Expiration Date:   09/06/2022    Scheduling Instructions:     Next available.    Order Specific Question:   Full PFT: includes the following: basic spirometry, spirometry pre & post bronchodilator, diffusion capacity (DLCO), lung volumes    Answer:   Full PFT    Order Specific Question:   This test can only be performed at    Answer:   [facility]   ECHOCARDIOGRAM COMPLETE    Standing Status:   Future    Standing Expiration Date:   03/06/2022    Order Specific Question:   Where should this test be performed    Answer:   [facility]     Order Specific Question:   Perflutren DEFINITY (image enhancing agent) should be administered unless hypersensitivity or allergy exist    Answer:   Administer Perflutren    Order Specific Question:   Reason for exam-Echo    Answer:   Dyspnea  R06.00    We will see her in follow-up in 4 to 6 weeks time she is to contact us prior to that time should any new difficulties arise.  We will notify her of the results of the testing as it becomes available.  Gailen Shelter, MD Advanced Bronchoscopy PCCM [facility]    *This note was dictated using voice recognition software/Dragon.  Despite best efforts to proofread, errors can occur which can change the meaning.  Any change was purely unintentional.

## 2021-09-06 NOTE — Patient Instructions (Signed)
We are going to get breathing test, a heart test and the CT of the chest to evaluate your shortness of breath issues.  We will see him in follow-up in 4 to 6 weeks time with either me or the nurse practitioner.  We will be calling you with the results of your testing.

## 2021-09-07 ENCOUNTER — Ambulatory Visit: Payer: 59 | Admitting: Internal Medicine

## 2021-09-07 ENCOUNTER — Ambulatory Visit: Payer: 59 | Attending: Pulmonary Disease

## 2021-09-07 DIAGNOSIS — J8410 Pulmonary fibrosis, unspecified: Secondary | ICD-10-CM

## 2021-09-07 LAB — SARS CORONAVIRUS 2 (TAT 6-24 HRS): SARS Coronavirus 2: NEGATIVE

## 2021-09-13 ENCOUNTER — Other Ambulatory Visit: Payer: Self-pay | Admitting: Nephrology

## 2021-09-13 DIAGNOSIS — N179 Acute kidney failure, unspecified: Secondary | ICD-10-CM

## 2021-09-13 DIAGNOSIS — I10 Essential (primary) hypertension: Secondary | ICD-10-CM | POA: Diagnosis not present

## 2021-09-17 ENCOUNTER — Other Ambulatory Visit: Payer: Self-pay

## 2021-09-17 ENCOUNTER — Ambulatory Visit
Admission: RE | Admit: 2021-09-17 | Discharge: 2021-09-17 | Disposition: A | Discharge status: Home / Self Care | Payer: 59 | Source: Ambulatory Visit | Attending: Nephrology | Admitting: Nephrology

## 2021-09-17 DIAGNOSIS — K760 Fatty (change of) liver, not elsewhere classified: Secondary | ICD-10-CM | POA: Diagnosis not present

## 2021-09-17 DIAGNOSIS — N179 Acute kidney failure, unspecified: Secondary | ICD-10-CM | POA: Diagnosis not present

## 2021-09-20 ENCOUNTER — Encounter: Payer: Self-pay | Admitting: Internal Medicine

## 2021-09-20 ENCOUNTER — Ambulatory Visit: Payer: 59 | Admitting: Internal Medicine

## 2021-09-20 ENCOUNTER — Other Ambulatory Visit: Payer: Self-pay

## 2021-09-20 VITALS — BP 122/82 | HR 56 | Temp 98.6°F | Ht 64.17 in | Wt 252.2 lb

## 2021-09-20 DIAGNOSIS — M1990 Unspecified osteoarthritis, unspecified site: Secondary | ICD-10-CM | POA: Diagnosis not present

## 2021-09-20 DIAGNOSIS — E782 Mixed hyperlipidemia: Secondary | ICD-10-CM

## 2021-09-20 DIAGNOSIS — E079 Disorder of thyroid, unspecified: Secondary | ICD-10-CM | POA: Diagnosis not present

## 2021-09-20 DIAGNOSIS — Z1329 Encounter for screening for other suspected endocrine disorder: Secondary | ICD-10-CM | POA: Diagnosis not present

## 2021-09-20 DIAGNOSIS — I10 Essential (primary) hypertension: Secondary | ICD-10-CM | POA: Diagnosis not present

## 2021-09-20 MED ORDER — NALTREXONE HCL 50 MG PO TABS: 25.0000 mg | ORAL_TABLET | Freq: Every day | ORAL | 4 refills | Status: DC

## 2021-09-20 NOTE — Progress Notes (Signed)
BP 122/82   Pulse (!) 56   Temp 98.6 F (37 C) (Oral)   Ht 5' 4.17" (1.63 m)   Wt 252 lb 3.2 oz (114.4 kg)   SpO2 96%   BMI 43.06 kg/m    Subjective:    Patient ID: Bianca Michael, female    DOB: January 04, 1966, 55 y.o.   MRN: [id number]  Chief Complaint  Patient presents with   Weight Check   Shortness of Breath    HPI: Bianca Michael is a 55 y.o. female  Pt was seen by oncology and has been taken off of xarelto/   Had COVID in jan - march 2022. Had a PE and was on xarlto  Sees rheumaology @ [facility] This is a chronic (? sec to COPD, PFTs pending, seen by pulmonology) problem. The current episode started more than 1 year ago. The problem has been waxing and waning. Pertinent negatives include no abdominal pain, chest pain, hemoptysis, rhinorrhea, sore throat, sputum production, swollen glands, syncope or vomiting.   Chief Complaint  Patient presents with   Weight Check   Shortness of Breath    Relevant past medical, surgical, family and social history reviewed and updated as indicated. Interim medical history since our last visit reviewed. Allergies and medications reviewed and updated.  Review of Systems  HENT:  Negative for rhinorrhea and sore throat.   Respiratory:  Positive for shortness of breath. Negative for hemoptysis and sputum production.   Cardiovascular:  Negative for chest pain and syncope.  Gastrointestinal:  Negative for abdominal pain and vomiting.   Per HPI unless specifically indicated above     Objective:    BP 122/82   Pulse (!) 56   Temp 98.6 F (37 C) (Oral)   Ht 5' 4.17" (1.63 m)   Wt 252 lb 3.2 oz (114.4 kg)   SpO2 96%   BMI 43.06 kg/m   Wt Readings from Last 3 Encounters:  09/20/21 252 lb 3.2 oz (114.4 kg)  09/06/21 251 lb (113.9 kg)  08/19/21 245 lb 2.4 oz (111.2 kg)    Physical Exam Vitals and nursing note reviewed.  Constitutional:      General: She is not in acute distress.    Appearance:  Normal appearance. She is not ill-appearing or diaphoretic.     Interventions: She is not intubated. Eyes:     Conjunctiva/sclera: Conjunctivae normal.  Pulmonary:     Effort: No tachypnea, bradypnea, accessory muscle usage or respiratory distress. She is not intubated.     Breath sounds: No stridor. No rhonchi.  Abdominal:     General: Abdomen is flat. Bowel sounds are normal. There is no distension.     Palpations: Abdomen is soft. There is no mass.     Tenderness: There is no abdominal tenderness. There is no guarding.  Skin:    General: Skin is warm and dry.     Coloration: Skin is not jaundiced.     Findings: No erythema.  Neurological:     Mental Status: She is alert.    Results for orders placed or performed during the hospital encounter of 09/06/21  SARS CORONAVIRUS 2 (TAT 6-24 HRS) Nasopharyngeal Nasopharyngeal Swab   Specimen: Nasopharyngeal Swab  Result Value Ref Range   SARS Coronavirus 2 NEGATIVE NEGATIVE        Current Outpatient Medications:    albuterol (VENTOLIN HFA) 108 (90 Base) MCG/ACT inhaler, Inhale into the lungs., Disp: , Rfl:  Ascorbic Acid (VITAMIN C) 1000 MG tablet, Take 1,000 mg by mouth daily., Disp: , Rfl:    buPROPion (WELLBUTRIN XL) 300 MG 24 hr tablet, Take by mouth., Disp: , Rfl:    Cholecalciferol (VITAMIN D3 ULTRA STRENGTH PO), Take 5,000 Int'l Units/day by mouth., Disp: , Rfl:    halobetasol (ULTRAVATE) 0.05 % ointment, Apply topically 2 (two) times daily., Disp: , Rfl:    hydroxychloroquine (PLAQUENIL) 200 MG tablet, Take 2 tablets (400 mg total) by mouth daily., Disp: 60 tablet, Rfl: 2   hydrOXYzine (ATARAX/VISTARIL) 25 MG tablet, Take by mouth., Disp: , Rfl:    levothyroxine (SYNTHROID) 112 MCG tablet, Take 1 tablet (112 mcg total) by mouth daily., Disp: 90 tablet, Rfl: 0   metoprolol tartrate (LOPRESSOR) 25 MG tablet, Take by mouth., Disp: , Rfl:    Multiple Vitamins-Minerals (WOMENS MULTI) CAPS, Take 1 capsule by mouth daily., Disp: ,  Rfl:    naltrexone (DEPADE) 50 MG tablet, Take 0.5 tablets (25 mg total) by mouth daily., Disp: 30 tablet, Rfl: 0   omeprazole (PRILOSEC) 40 MG capsule, Take 40 mg by mouth daily., Disp: , Rfl:    pravastatin (PRAVACHOL) 10 MG tablet, Take by mouth., Disp: , Rfl:    sertraline (ZOLOFT) 25 MG tablet, Take by mouth., Disp: , Rfl:    TH MAGNESIUM PO, Take 450 mg by mouth daily., Disp: , Rfl:    zinc gluconate 50 MG tablet, Take 50 mg by mouth daily., Disp: , Rfl:    rivaroxaban (XARELTO) 20 MG TABS tablet, Take 1 tablet (20 mg total) by mouth daily with supper., Disp: 30 tablet, Rfl: 1    Assessment & Plan:   Obesity : is on naltroxen / welbutrin  Has helped a bit on 25 mg daily of naltroxen   CKD :  9/19 - 1.19 creatinine - much better  GFR up a little as well.  Increased water intake Sees nephrology @ dr. [place] office  Seronegative inflammatory arthritis  Sees rheum @  Takes plauquenil for such  Sees ophthalmology    SOBOE takes albuterol  To fu with pulmonology next month for such   Lower extremity swelling to have an ECHO with pulmonology  Has restrictive lung disease from PFTs HRCT - 9/27  Oct 13th - echo   Elevated TSH - Dr. Roberts Gaudy  Is on 112 mcg of levothyroxine    Problem List Items Addressed This Visit   None    No orders of the defined types were placed in this encounter.  No orders of the defined types were placed in this encounter.    Follow up plan: No follow-ups on file.   Labs next visit : CBC, CMP, FLP, HBA1C, TSH, PSA, urine microalbumin Labs 1 week prior to next visit.

## 2021-09-21 ENCOUNTER — Ambulatory Visit
Admission: RE | Admit: 2021-09-21 | Discharge: 2021-09-21 | Disposition: A | Discharge status: Home / Self Care | Payer: 59 | Source: Ambulatory Visit | Attending: Pulmonary Disease | Admitting: Pulmonary Disease

## 2021-09-21 DIAGNOSIS — R0602 Shortness of breath: Secondary | ICD-10-CM | POA: Diagnosis not present

## 2021-09-21 DIAGNOSIS — R079 Chest pain, unspecified: Secondary | ICD-10-CM | POA: Diagnosis not present

## 2021-09-21 DIAGNOSIS — I700 Atherosclerosis of aorta: Secondary | ICD-10-CM | POA: Diagnosis not present

## 2021-09-22 ENCOUNTER — Inpatient Hospital Stay: Payer: 59 | Attending: Oncology

## 2021-09-22 ENCOUNTER — Other Ambulatory Visit: Payer: Self-pay

## 2021-09-22 ENCOUNTER — Encounter: Payer: Self-pay | Admitting: Internal Medicine

## 2021-09-22 DIAGNOSIS — E063 Autoimmune thyroiditis: Secondary | ICD-10-CM | POA: Diagnosis not present

## 2021-09-22 DIAGNOSIS — Z86711 Personal history of pulmonary embolism: Secondary | ICD-10-CM | POA: Insufficient documentation

## 2021-09-23 ENCOUNTER — Other Ambulatory Visit: Payer: 59

## 2021-09-23 LAB — BETA-2-GLYCOPROTEIN I ABS, IGG/M/A
Beta-2 Glyco I IgG: <9 GPI IgG units (ref 0–20)
Beta-2-Glycoprotein I IgA: <9 GPI IgA units (ref 0–25)
Beta-2-Glycoprotein I IgM: <9 GPI IgM units (ref 0–32)

## 2021-09-28 ENCOUNTER — Other Ambulatory Visit: Payer: Self-pay | Admitting: Internal Medicine

## 2021-09-28 DIAGNOSIS — I700 Atherosclerosis of aorta: Secondary | ICD-10-CM

## 2021-09-28 DIAGNOSIS — E039 Hypothyroidism, unspecified: Secondary | ICD-10-CM | POA: Diagnosis not present

## 2021-09-28 DIAGNOSIS — N1831 Chronic kidney disease, stage 3a: Secondary | ICD-10-CM | POA: Diagnosis not present

## 2021-09-28 NOTE — Progress Notes (Signed)
Pl let pt know will refer cardiology Aortic atherosclerosis, in addition to left main and 3 vessel coronary artery disease. sec to abnl calicifications seen on CT chest  Pl have her fu with me x 4 weeks  looks like next appt isnt until January  Thank you !

## 2021-10-05 ENCOUNTER — Encounter: Payer: Self-pay | Admitting: Pulmonary Disease

## 2021-10-05 ENCOUNTER — Other Ambulatory Visit: Payer: Self-pay

## 2021-10-05 ENCOUNTER — Ambulatory Visit: Payer: 59 | Admitting: Pulmonary Disease

## 2021-10-05 VITALS — BP 122/78 | HR 69 | Temp 97.8°F | Ht 64.0 in | Wt 251.0 lb

## 2021-10-05 DIAGNOSIS — G478 Other sleep disorders: Secondary | ICD-10-CM | POA: Diagnosis not present

## 2021-10-05 DIAGNOSIS — Z86711 Personal history of pulmonary embolism: Secondary | ICD-10-CM

## 2021-10-05 DIAGNOSIS — J8410 Pulmonary fibrosis, unspecified: Secondary | ICD-10-CM

## 2021-10-05 DIAGNOSIS — Z8616 Personal history of COVID-19: Secondary | ICD-10-CM

## 2021-10-05 DIAGNOSIS — R0602 Shortness of breath: Secondary | ICD-10-CM | POA: Diagnosis not present

## 2021-10-05 NOTE — Progress Notes (Signed)
Subjective:    Patient ID: Bianca Michael, female    DOB: [birth year], 55 y.o.   MRN: [id number] Chief Complaint  Patient presents with   Follow-up    Sob with exertion.     HPI Bianca Michael is a 55 year old remote former smoker (15 PY total) who presents for follow-up on an issue of dyspnea in the setting of prior severe COVID-19 infection and pulmonary embolism associated with the same.  This is a scheduled visit.  Recall she was initially evaluated on 06 September 2021.  At that time PFTs and 6 high-resolution CT chest and a 2D echo were ordered.  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  We discussed that weight loss can help her in this regard.  Her dyspnea continues to be an issue and is out of proportion to her pulmonary function and findings on CT chest.  CT chest does show some mild fibrotic changes that appear to be postinflammatory.  She also has evidence of potential three-vessel CAD and has been referred to cardiology.  She is to have her 2D echo on the 13th (2 days time).  She does not note significant improvement on her symptoms with inhalers.  This is not surprising given her PFT findings.  She does have issues with terrible sleep hygiene.  She does have loud snoring and nonrestorative sleep.  These issues may be causing her fatigue during the day.  Fatigue is often confused with shortness of breath.  She does not voice any other complaint.  As an aside the CT chest did not show any evidence of upper airway obstruction.   DATA: 09/07/2021  PFTs: FEV1 2.28 L or 86% predicted, FVC 2.57 L or 75% predicted, FEV1/FVC 89%, no bronchodilator response.  There is mild restrictive physiology due to ILD and/or obesity (ERV 24%) diffusion capacity was normal. 09/21/2021 CT chest high-resolution: Possible early interstitial lung disease areas of  architectural distortion and groundglass attenuation likely representing infectious or inflammatory scarring.  Mild bronchiectasis.  Evidence of three-vessel coronary artery disease.  Severe hepatic steatosis  Review of Systems A 10 point review of systems was performed and it is as noted above otherwise negative.  Patient Active Problem List   Diagnosis Date Noted   Elevated serum creatinine 08/16/2021   Morbid obesity (HCC) 08/16/2021   Positive ANA (antinuclear antibody) 07/26/2021   Pain and swelling of right wrist 07/26/2021   Inflammatory arthritis 07/21/2021   Screening for heart disease 07/21/2021   COVID-19 07/21/2021   History of pulmonary embolism 07/21/2021   Anxiety 07/21/2021   Acute pulmonary embolism without acute cor pulmonale (HCC) 04/07/2021   Elevated liver enzymes 01/28/2021   Hyperlipidemia 01/21/2021   Chronic neck and back pain 03/24/2020   Essential (primary) hypertension 03/13/2020   Major depressive disorder 03/13/2020   Chronic ethmoidal sinusitis 04/02/2019   Chronic frontal sinusitis 04/02/2019   Chronic maxillary sinusitis 04/02/2019   Nasal polyp 04/02/2019   Thyroid disease 06/19/2018   Social History   Tobacco Use   Smoking status: Former    Packs/day: 1.00    Years: 15.00    Pack years: 15.00    Types: Cigarettes    Quit date: 12/26/2000    Years since quitting: 20.7   Smokeless tobacco: Never  Substance Use Topics   Alcohol use: Not Currently   No Known Allergies Current Meds  Medication Sig  albuterol (VENTOLIN HFA) 108 (90 Base) MCG/ACT inhaler Inhale into the lungs.   Ascorbic Acid (VITAMIN C) 1000 MG tablet Take 1,000 mg by mouth daily.   buPROPion (WELLBUTRIN XL) 300 MG 24 hr tablet Take by mouth.   Cholecalciferol (VITAMIN D3 ULTRA STRENGTH PO) Take 5,000 Int'l Units/day by mouth.   halobetasol (ULTRAVATE) 0.05 % ointment Apply topically 2 (two) times daily.   hydroxychloroquine (PLAQUENIL) 200 MG tablet Take 2 tablets (400 mg  total) by mouth daily.   hydrOXYzine (ATARAX/VISTARIL) 25 MG tablet Take by mouth.   levothyroxine (SYNTHROID) 112 MCG tablet Take 1 tablet (112 mcg total) by mouth daily.   metoprolol tartrate (LOPRESSOR) 25 MG tablet Take by mouth.   Multiple Vitamins-Minerals (WOMENS MULTI) CAPS Take 1 capsule by mouth daily.   naltrexone (DEPADE) 50 MG tablet Take 0.5 tablets (25 mg total) by mouth daily.   omeprazole (PRILOSEC) 40 MG capsule Take 40 mg by mouth daily.   pravastatin (PRAVACHOL) 10 MG tablet Take by mouth.   sertraline (ZOLOFT) 25 MG tablet Take by mouth.   TH MAGNESIUM PO Take 450 mg by mouth daily.   zinc gluconate 50 MG tablet Take 50 mg by mouth daily.    There is no immunization history on file for this patient.     Objective:   Physical Exam BP 122/78 (BP Location: Left Arm, Cuff Size: Normal)   Pulse 69   Temp 97.8 F (36.6 C) (Temporal)   Ht 5\' 4"  (1.626 m)   Wt 251 lb (113.9 kg) Comment: per patient.  SpO2 98%   BMI 43.08 kg/m   GENERAL: Obese woman, no acute distress, fully ambulatory, raspy voice, no conversational dyspnea. HEAD: Normocephalic, atraumatic.  EYES: Pupils equal, round, reactive to light.  No scleral icterus.  MOUTH: Nose/mouth/throat not examined due to masking requirements for COVID 19. NECK: Supple. No thyromegaly. Trachea midline. No JVD.  No adenopathy.  Well-healed tracheostomy scar. PULMONARY: Good air entry bilaterally.  No adventitious sounds. CARDIOVASCULAR: S1 and S2. Regular rate and rhythm.  No rubs, murmurs or gallops heard. ABDOMEN: Obese, well-healed PEG scar. MUSCULOSKELETAL: No joint deformity, no clubbing, no edema.  NEUROLOGIC: No focal deficit, no gait disturbance, speech is fluent. SKIN: Intact,warm,dry.  Mild discoloration of the toes distally, no frank ischemia.  Regrowing toenails. PSYCH: Mood and behavior normal.      Assessment & Plan:     ICD-10-CM   1. 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 Non-restorative sleep  G47.8 Home sleep test   Epworth scale 12 Significantly symptomatic Home sleep study    4. History of pulmonary embolus (PE)  Z86.711    Due to COVID-19 She completed Xarelto therapy    5. Personal history of COVID-19  Z86.16    This issue adds complexity to her management Significant sequela from her severe illness     Orders Placed This Encounter  Procedures   Home sleep test    Standing Status:   Future    Standing Expiration Date:   10/05/2022    Order Specific Question:   Where should this test be performed:    Answer:   [facility] - Pulmonary  We will call the patient with the results of her echocardiogram as soon as available.  I agree that she needs cardiology evaluation.  We have ordered a home sleep study.  I have recommended that she engage in weight loss management.  This will help with her dyspnea.  In addition she has issues with significant hepatic steatosis and proper diet may help with this.  We will see her in follow-up in 6 to 8 weeks time she is to contact us prior to that time should any new difficulties arise.  Bianca Shelter, MD [facility]    *This note was dictated using voice recognition software/Dragon.  Despite best efforts to proofread, errors can occur which can change the meaning.  Any change was purely unintentional.

## 2021-10-05 NOTE — Patient Instructions (Signed)
I will call you with the results of the echocardiogram as soon as I get those.  We are going to get a home sleep study.  Sleep issues may be causing a few of your symptoms during the day of fatigue.  At present I do not think you need any inhalers.  Recommend evaluating a diet for liver and for weight loss.  We will see you in follow-up in 6 to 8 weeks time call sooner should any new problems arise.

## 2021-10-06 ENCOUNTER — Other Ambulatory Visit: Payer: Self-pay

## 2021-10-06 NOTE — Telephone Encounter (Signed)
Requesting refill of Omeprazole 40 mg QD  Last seen in office 09/20/21 Up coming visit 11/2

## 2021-10-07 ENCOUNTER — Other Ambulatory Visit: Payer: Self-pay

## 2021-10-07 ENCOUNTER — Ambulatory Visit (INDEPENDENT_AMBULATORY_CARE_PROVIDER_SITE_OTHER): Payer: 59

## 2021-10-07 DIAGNOSIS — J8410 Pulmonary fibrosis, unspecified: Secondary | ICD-10-CM | POA: Diagnosis not present

## 2021-10-07 MED ORDER — OMEPRAZOLE 40 MG PO CPDR: 40.0000 mg | DELAYED_RELEASE_CAPSULE | Freq: Every day | ORAL | 3 refills | Status: DC

## 2021-10-07 MED ORDER — PERFLUTREN LIPID MICROSPHERE
1.0000 mL | INTRAVENOUS | Status: AC | PRN
  Administered 2021-10-07: 2 mL via INTRAVENOUS

## 2021-10-08 LAB — ECHOCARDIOGRAM COMPLETE
AR max vel: 2.74 cm2
AV Area VTI: 2.7 cm2
AV Area mean vel: 2.85 cm2
AV Mean grad: 5 mmHg
AV Peak grad: 9 mmHg
Ao pk vel: 1.5 m/s
Area-P 1/2: 2.51 cm2
S' Lateral: 2 cm
Single Plane A4C EF: 63.6 %

## 2021-10-10 NOTE — Progress Notes (Signed)
Office Visit Note  Patient: Bianca Michael             Date of Birth: 1966/06/02           MRN: [id number]             PCP: Loura Pardon, MD Referring: Loura Pardon, MD Visit Date: 10/11/2021   Subjective:   History of Present Illness: Bianca Michael is a 55 y.o. female here for follow up for seronegative arthritis after starting hydroxychloroquine 400 mg PO daily. At previous visit objective inflammation at the wrist suggestive for dequervains but also polyarthralgia and fatigue. Hand and wrist pain improved a lot now able to make tight fists, but has had some increase overall she associates with timing of weather changes. She sustained a fall after walking for a long time at the mall with no good location to sit for a break and struck the right side of her head. Currently bilateral shoulder pain is worse, especially on left. This has woken her from sleep a few times. No numbness down the arms.   Previous HPI 08/11/21 Bianca Michael is a 55 y.o. female here for follow up for positive ANA and joint pains and numerous symptoms since severe COVID illness. Lab testing at initial visit redemonstrated ANA at 1:40 nuclear dots pattern. She did not have very active inflammation at the time outside of right wrist probable first dorsal compartment.  Symptoms remain about the same since last visit with somewhat generalized joint pain still worst in the right wrist and left shoulder.  She is interested in trying possible treatments to help reduce her overall body aches and fatigue level but does not want to go on long-term steroids due to previous extensive weight gain from them   Previous HPI: 07/26/21 Bianca Michael is a 55 y.o. female here for evaluation of positive ANA with recent symptoms of joint pains, pulmonary embolus on xarelto, and peripheral ischemia in toes transiently attributed to vasopressor treatment with loss of toenails. She has a history of hashimoto's thyroiditis versus  Graves' disease has been treated with both methimazole and levothyroxine. Overall she has been feeling in about usual health till becoming acutely ill earlier this year.  She does recall some bronchitis type symptoms preceding the January COVID illness and hospitalization.  She does not recall much from that stay since she experienced significant portion of 2 months unconscious in ICU requiring tracheostomy for ventilator support after pulmonary embolus and collapsed lung.  After initially in the hospital she required extensive physical therapy with inability to walk or even transfer independently.  She does feel she has been more reliant on her upper extremity mobility during the initial period due to severe leg weakness. Currently her worst affected area is the right wrist with ongoing pain and swelling and decreased ability to flex and extend without pain. This has been persistently swollen for weeks, the left wrist hurts just intermittently and without visible swelling. She does have family history with first-degree relative and carrying diagnosis of lupus who has been treated with Plaquenil and prednisone.   Labs reviewed 06/2021 ANA pos dsDNA 13 RNP, Smith, SSA, SSB, chromatin, Jo-1, centromere neg RF 11.3 Uric acid 7.1 B2GP1 IgA/IgM/IgG neg   03/2021 B2GP1 IgG 60 ACA neg LA neg   Imaging reviewed 07/21/21 Xray right knee 4 views Mild osteoarthritis appears most advanced in the medial compartments. 07/21/21 Xray left knee 4 views Mild osteoarthritis appears most advanced in the medial compartments.  07/21/21 Xray right wrist No acute finding. Mild joint space narrowing at the radiocarpal joint in the region of the radial styloid and navicular consistent with mild osteoarthritis. 07/21/21 Xray chest 2 views Mild bronchial thickening. Streaky opacity at the left lung base, may be atelectasis or scarring. No confluent consolidation.   Review of Systems  Constitutional:  Positive for  fatigue.  HENT:  Positive for mouth dryness and nose dryness. Negative for mouth sores.   Eyes:  Positive for dryness. Negative for pain and itching.  Respiratory:  Positive for shortness of breath. Negative for difficulty breathing.   Cardiovascular:  Negative for chest pain and palpitations.  Gastrointestinal:  Negative for blood in stool, constipation and diarrhea.  Endocrine: Negative for increased urination.  Genitourinary:  Negative for difficulty urinating.  Musculoskeletal:  Positive for joint pain, joint pain, joint swelling, myalgias, morning stiffness, muscle tenderness and myalgias.  Skin:  Negative for color change, rash and redness.  Allergic/Immunologic: Negative for susceptible to infections.  Neurological:  Positive for numbness, memory loss and weakness. Negative for dizziness and headaches.  Hematological:  Negative for bruising/bleeding tendency.  Psychiatric/Behavioral:  Positive for confusion.    PMFS History:  Patient Active Problem List   Diagnosis Date Noted   Elevated serum creatinine 08/16/2021   Morbid obesity (HCC) 08/16/2021   Positive ANA (antinuclear antibody) 07/26/2021   Inflammatory arthritis 07/21/2021   Screening for heart disease 07/21/2021   COVID-19 07/21/2021   History of pulmonary embolism 07/21/2021   Anxiety 07/21/2021   Acute pulmonary embolism without acute cor pulmonale (HCC) 04/07/2021   Elevated liver enzymes 01/28/2021   Hyperlipidemia 01/21/2021   Chronic neck and back pain 03/24/2020   Essential (primary) hypertension 03/13/2020   Major depressive disorder 03/13/2020   Chronic ethmoidal sinusitis 04/02/2019   Chronic frontal sinusitis 04/02/2019   Chronic maxillary sinusitis 04/02/2019   Nasal polyp 04/02/2019   Thyroid disease 06/19/2018    Past Medical History:  Diagnosis Date   Anxiety    Chronic kidney disease    COVID-19 12/2020   Patient was hospitalized   Depression    Hyperlipidemia    Hypertension     Pneumothorax    Left lung   Pulmonary embolism (HCC)    Thyroid disease     Family History  Problem Relation Age of Onset   Breast cancer Mother 51   Colon cancer Mother    Heart attack Father    Heart disease Father    High Cholesterol Father    Thyroid disease Sister    Breast cancer Sister    Thyroid disease Sister    Thyroid disease Sister    Lupus Sister    Rheum arthritis Sister    Thyroid disease Daughter    Past Surgical History:  Procedure Laterality Date   CARPAL TUNNEL RELEASE     CHOLECYSTECTOMY     TONSILLECTOMY  1970   TUBAL LIGATION     Social History   Social History Narrative   Not on file   Immunization History  Administered Date(s) Administered   Hep A / Hep B 12/14/2021, 01/31/2022     Objective: Vital Signs: BP 125/79 (BP Location: Left Arm, Patient Position: Sitting, Cuff Size: Large)   Pulse 62   Ht 5\' 4"  (1.626 m)   Wt 251 lb 8 oz (114.1 kg)   BMI 43.17 kg/m    Physical Exam Constitutional:      Appearance: She is obese.  Eyes:     Conjunctiva/sclera: Conjunctivae  normal.  Skin:    General: Skin is warm and dry.     Findings: No rash.  Neurological:     Mental Status: She is alert.     Deep Tendon Reflexes: Reflexes normal.     Musculoskeletal Exam:  Neck full ROM tightness with lateral rotation both directions Shoulders full ROM, guarding against overhead abduction, pain anteriorly with internal and external rotation while partially abducted Elbows full ROM no tenderness or swelling Wrists full ROM no tenderness or swelling Fingers full ROM, tenderness to pressure at left thumb base, no palpable synovitis Knees full ROM no tenderness or swelling, patellofemoral crepitus b/l Ankles full ROM no tenderness or swelling   CDAI Exam: CDAI Score: 9  Patient Global: 30 mm; Provider Global: 30 mm Swollen: 0 ; Tender: 3  Joint Exam 10/11/2021      Right  Left  Glenohumeral   Tender   Tender  MCP 1      Tender      Investigation: No additional findings.  Imaging: No results found.  Recent Labs: Lab Results  Component Value Date   WBC 6.0 02/23/2022   HGB 14.8 02/23/2022   PLT 193 02/23/2022   NA 141 02/23/2022   K 4.6 02/23/2022   CL 103 02/23/2022   CO2 25 02/23/2022   GLUCOSE 108 (H) 02/23/2022   BUN 12 02/23/2022   CREATININE 1.25 (H) 02/23/2022   BILITOT 0.3 02/23/2022   ALKPHOS 106 02/23/2022   AST 21 02/23/2022   ALT 31 02/23/2022   PROT 7.1 02/23/2022   ALBUMIN 4.4 02/23/2022   CALCIUM 9.9 02/23/2022    Speciality Comments: PLQ Eye Exam 08/27/2021 [facility]  Procedures:  No procedures performed Allergies: Patient has no known allergies.   Assessment / Plan:     Visit Diagnoses: Inflammatory arthritis Positive ANA (antinuclear antibody) Seronegative inflammatory arthritis - Plan: hydroxychloroquine (PLAQUENIL) 200 MG tablet  Noticeable improvement in joint pain and swelling in her hands. Shoulder remain painful, night and positional symptoms suggestive of possible bursitis or rotator cuff arthropathy. Tolerating medication without difficulty. Plan to continue HCQ 400 mg daily.  Orders: No orders of the defined types were placed in this encounter.   Meds ordered this encounter  Medications   hydroxychloroquine (PLAQUENIL) 200 MG tablet    Sig: Take 2 tablets (400 mg total) by mouth daily.    Dispense:  60 tablet    Refill:  5      Follow-Up Instructions: Return in about 6 months (around 04/11/2022) for Seronegative arthritis on HCQ f/u 46mos.   Fuller Plan, MD  Note - This record has been created using [employer].  Chart creation errors have been sought, but may not always  have been located. Such creation errors do not reflect on  the standard of medical care.

## 2021-10-11 ENCOUNTER — Other Ambulatory Visit: Payer: Self-pay

## 2021-10-11 ENCOUNTER — Ambulatory Visit (INDEPENDENT_AMBULATORY_CARE_PROVIDER_SITE_OTHER): Payer: 59 | Admitting: Internal Medicine

## 2021-10-11 ENCOUNTER — Encounter: Payer: Self-pay | Admitting: Internal Medicine

## 2021-10-11 VITALS — BP 125/79 | HR 62 | Ht 64.0 in | Wt 251.5 lb

## 2021-10-11 DIAGNOSIS — M1990 Unspecified osteoarthritis, unspecified site: Secondary | ICD-10-CM

## 2021-10-11 DIAGNOSIS — R768 Other specified abnormal immunological findings in serum: Secondary | ICD-10-CM

## 2021-10-11 DIAGNOSIS — M1380 Other specified arthritis, unspecified site: Secondary | ICD-10-CM

## 2021-10-11 DIAGNOSIS — R69 Illness, unspecified: Secondary | ICD-10-CM | POA: Diagnosis not present

## 2021-10-11 MED ORDER — HYDROXYCHLOROQUINE SULFATE 200 MG PO TABS: 400.0000 mg | ORAL_TABLET | Freq: Every day | ORAL | 5 refills | Status: DC

## 2021-10-18 DIAGNOSIS — N1831 Chronic kidney disease, stage 3a: Secondary | ICD-10-CM | POA: Diagnosis not present

## 2021-10-18 DIAGNOSIS — I10 Essential (primary) hypertension: Secondary | ICD-10-CM | POA: Diagnosis not present

## 2021-10-25 ENCOUNTER — Other Ambulatory Visit: Payer: Self-pay

## 2021-10-25 ENCOUNTER — Encounter: Payer: Self-pay | Admitting: Cardiology

## 2021-10-25 ENCOUNTER — Ambulatory Visit: Payer: 59 | Admitting: Cardiology

## 2021-10-25 VITALS — BP 118/78 | HR 61 | Ht 69.0 in | Wt 251.0 lb

## 2021-10-25 DIAGNOSIS — I2510 Atherosclerotic heart disease of native coronary artery without angina pectoris: Secondary | ICD-10-CM

## 2021-10-25 DIAGNOSIS — I10 Essential (primary) hypertension: Secondary | ICD-10-CM | POA: Diagnosis not present

## 2021-10-25 DIAGNOSIS — E782 Mixed hyperlipidemia: Secondary | ICD-10-CM

## 2021-10-25 DIAGNOSIS — R0609 Other forms of dyspnea: Secondary | ICD-10-CM | POA: Diagnosis not present

## 2021-10-25 MED ORDER — CLOPIDOGREL BISULFATE 75 MG PO TABS: 75.0000 mg | ORAL_TABLET | Freq: Every day | ORAL | 3 refills | Status: DC

## 2021-10-25 MED ORDER — ATORVASTATIN CALCIUM 80 MG PO TABS: 80.0000 mg | ORAL_TABLET | Freq: Every day | ORAL | 3 refills | Status: DC

## 2021-10-25 NOTE — Progress Notes (Signed)
Cardiology Office Note:    Date:  10/25/2021   ID:  Bianca Michael, DOB [birth year], MRN [id number]  PCP:  Loura Pardon, MD   [facility] Providers Cardiologist:  None     Referring MD: Loura Pardon, MD   Chief Complaint  Patient presents with   Other    Aortic Atherosclerosis c/o sob and edema Meds reviewed verbally with pt.   Bianca Michael is a 55 y.o. female who is being seen today for the evaluation of aortic atherosclerosis at the request of Vigg, Avanti, MD.   History of Present Illness:    Bianca Michael is a 55 y.o. female with a hx of hypertension, hyperlipidemia, PE, CKD former smoker who presents due to aortic and coronary artery calcification.  Patient diagnosed with COVID in January 2022.  She was hospitalized for about 2 months.  Also diagnosed with PE placed on Xarelto.  Coagulation work-up unrevealing.  Had a high-resolution chest CT 09/21/2020 showing aortic atherosclerosis and three-vessel coronary artery calcification.  She states having shortness of breath with minimal exertion ever since COVID diagnosis.  Had CKD, requiring dialysis.  Creatinine has been improving, currently not on dialysis.  Follows up with nephrology, she was advised not to take NSAIDs or aspirin.  Her father has a history of heart attack in his 58s.  Echocardiogram obtained 09/2021 showed EF 60 to 65%, impaired relaxation.  Mild Bianca Michael, normal RVSP.  Past Medical History:  Diagnosis Date   Anxiety    Chronic kidney disease    COVID-19 12/2020   Patient was hospitalized   Depression    Hyperlipidemia    Hypertension    Pneumothorax    Left lung   Pulmonary embolism (HCC)    Thyroid disease     Past Surgical History:  Procedure Laterality Date   CARPAL TUNNEL RELEASE     CHOLECYSTECTOMY     TONSILLECTOMY  1970   TUBAL LIGATION      Current Medications: Current Meds  Medication Sig   albuterol (VENTOLIN HFA) 108 (90 Base) MCG/ACT inhaler Inhale into the  lungs.   Ascorbic Acid (VITAMIN C) 1000 MG tablet Take 1,000 mg by mouth daily.   atorvastatin (LIPITOR) 80 MG tablet Take 1 tablet (80 mg total) by mouth daily.   buPROPion (WELLBUTRIN XL) 300 MG 24 hr tablet Take by mouth.   Cholecalciferol (VITAMIN D3 ULTRA STRENGTH PO) Take 5,000 Int'l Units/day by mouth.   clopidogrel (PLAVIX) 75 MG tablet Take 1 tablet (75 mg total) by mouth daily.   halobetasol (ULTRAVATE) 0.05 % ointment Apply topically 2 (two) times daily.   hydroxychloroquine (PLAQUENIL) 200 MG tablet Take 2 tablets (400 mg total) by mouth daily.   hydrOXYzine (ATARAX/VISTARIL) 25 MG tablet Take by mouth.   levothyroxine (SYNTHROID) 112 MCG tablet Take 1 tablet (112 mcg total) by mouth daily.   magnesium oxide (MAG-OX) 400 MG tablet Take by mouth.   metoprolol tartrate (LOPRESSOR) 25 MG tablet Take by mouth 2 (two) times daily.   Multiple Vitamins-Minerals (WOMENS MULTI) CAPS Take 1 capsule by mouth daily.   naltrexone (DEPADE) 50 MG tablet Take 0.5 tablets (25 mg total) by mouth daily.   omeprazole (PRILOSEC) 40 MG capsule Take 1 capsule (40 mg total) by mouth daily.   sertraline (ZOLOFT) 25 MG tablet Take by mouth.   zinc gluconate 50 MG tablet Take 50 mg by mouth daily.   [DISCONTINUED] pravastatin (PRAVACHOL) 10 MG tablet Take by mouth daily.     Allergies:  Patient has no known allergies.   Social History   Socioeconomic History   Marital status: Married    Spouse name: Not on file   Number of children: Not on file   Years of education: Not on file   Highest education level: Not on file  Occupational History   Not on file  Tobacco Use   Smoking status: Former    Packs/day: 1.00    Years: 15.00    Pack years: 15.00    Types: Cigarettes    Quit date: 12/26/2000    Years since quitting: 20.8   Smokeless tobacco: Never  Vaping Use   Vaping Use: Never used  Substance and Sexual Activity   Alcohol use: Yes    Comment: very rarely   Drug use: Not Currently    Sexual activity: Not Currently  Other Topics Concern   Not on file  Social History Narrative   Not on file   Social Determinants of Health   Financial Resource Strain: Not on file  Food Insecurity: Not on file  Transportation Needs: Not on file  Physical Activity: Not on file  Stress: Not on file  Social Connections: Not on file     Family History: The patient's family history includes Breast cancer in her sister; Breast cancer (age of onset: 58) in her mother; Colon cancer in her mother; Heart attack in her father; Heart disease in her father; High Cholesterol in her father; Lupus in her sister; Rheum arthritis in her sister; Thyroid disease in her daughter, sister, sister, and sister.  ROS:   Please see the history of present illness.     All other systems reviewed and are negative.  EKGs/Labs/Other Studies Reviewed:    The following studies were reviewed today:   EKG:  EKG is  ordered today.  The ekg ordered today demonstrates normal sinus rhythm, normal ECG  Recent Labs: 08/11/2021: TSH 6.590 08/19/2021: ALT 36; BUN 19; Creatinine, Ser 1.27; Hemoglobin 14.6; Platelets 214; Potassium 4.5; Sodium 135  Recent Lipid Panel    Component Value Date/Time   CHOL 323 (H) 08/11/2021 0813   TRIG 376 (H) 08/11/2021 0813   HDL 50 08/11/2021 0813   CHOLHDL 6.5 (H) 08/11/2021 0813   LDLCALC 197 (H) 08/11/2021 0813     Risk Assessment/Calculations:          Physical Exam:    VS:  BP 118/78 (BP Location: Right Arm, Patient Position: Sitting, Cuff Size: Large)   Pulse 61   Ht 5\' 9"  (1.753 m)   Wt 251 lb (113.9 kg)   SpO2 96%   BMI 37.07 kg/m     Wt Readings from Last 3 Encounters:  10/25/21 251 lb (113.9 kg)  10/11/21 251 lb 8 oz (114.1 kg)  10/05/21 251 lb (113.9 kg)     GEN:  Well nourished, well developed in no acute distress HEENT: Normal NECK: No JVD; No carotid bruits LYMPHATICS: No lymphadenopathy CARDIAC: RRR, no murmurs, rubs, gallops RESPIRATORY: Poor  inspiratory effort, diminished breath sounds especially at right lung base ABDOMEN: Soft, non-tender, non-distended MUSCULOSKELETAL:  No edema; No deformity  SKIN: Warm and dry NEUROLOGIC:  Alert and oriented x 3 PSYCHIATRIC:  Normal affect   ASSESSMENT:    1. Dyspnea on exertion   2. Coronary artery disease involving native coronary artery of native heart, unspecified whether angina present   3. Primary hypertension   4. Mixed hyperlipidemia    PLAN:    In order of problems listed above:  Dyspnea on exertion, coronary artery calcification.  Echo with preserved EF 60%, impaired relaxation.  Get Lexiscan Myoview to evaluate any significant ischemia. CAD, three-vessel coronary artery calcification, dyspnea on exertion.  Start Plavix 75 mg daily, start Lipitor 80 mg.  Stop Pravachol. Hypertension, BP controlled.  Continue Lopressor. Hyperlipidemia, start Lipitor 80, stop Pravachol.  Repeat lipid panel in 2 months.   Follow-up after Steffanie Dunn,   Shared Decision Making/Informed Consent The risks [chest pain, shortness of breath, cardiac arrhythmias, dizziness, blood pressure fluctuations, myocardial infarction, stroke/transient ischemic attack, nausea, vomiting, allergic reaction, radiation exposure, metallic taste sensation and life-threatening complications (estimated to be 1 in 10,000)], benefits (risk stratification, diagnosing coronary artery disease, treatment guidance) and alternatives of a nuclear stress test were discussed in detail with Ms. Junker and she agrees to proceed.    Medication Adjustments/Labs and Tests Ordered: Current medicines are reviewed at length with the patient today.  Concerns regarding medicines are outlined above.  Orders Placed This Encounter  Procedures   NM Myocar Multi W/Spect W/Wall Motion / EF   Lipid panel   Cardiac Stress Test: Informed Consent Details: Physician/Practitioner Attestation; Transcribe to consent form and obtain patient  signature   EKG 12-Lead    Meds ordered this encounter  Medications   atorvastatin (LIPITOR) 80 MG tablet    Sig: Take 1 tablet (80 mg total) by mouth daily.    Dispense:  90 tablet    Refill:  3   clopidogrel (PLAVIX) 75 MG tablet    Sig: Take 1 tablet (75 mg total) by mouth daily.    Dispense:  90 tablet    Refill:  3     Patient Instructions  Medication Instructions:   STOP Pravastatin START Lipitor - Take one tablet (80 mg) by mouth daily.  START Plavix - Take one tablet (75 mg) by mouth daily.   *If you need a refill on your cardiac medications before your next appointment, please call your pharmacy*   Lab Work:  Your physician recommends that you return for lab work in: 2 months here at our office. You will need to be fasting.   If you have labs (blood work) drawn today and your tests are completely normal, you will receive your results only by: MyChart Message (if you have MyChart) OR A paper copy in the mail If you have any lab test that is abnormal or we need to change your treatment, we will call you to review the results.   Testing/Procedures:  [facility] MYOVIEW  Your caregiver has ordered a Stress Test with nuclear imaging. The purpose of this test is to evaluate the blood supply to your heart muscle. This procedure is referred to as a "Non-Invasive Stress Test." This is because other than having an IV started in your vein, nothing is inserted or "invades" your body. Cardiac stress tests are done to find areas of poor blood flow to the heart by determining the extent of coronary artery disease (CAD). Some patients exercise on a treadmill, which naturally increases the blood flow to your heart, while others who are  unable to walk on a treadmill due to physical limitations have a pharmacologic/chemical stress agent called Lexiscan . This medicine will mimic walking on a treadmill by temporarily increasing your coronary blood flow.   Please note: these test may take  anywhere between 2-4 hours to complete  PLEASE REPORT TO [facility] MEDICAL MALL ENTRANCE  THE VOLUNTEERS AT THE FIRST DESK WILL DIRECT YOU WHERE TO GO  Date of Procedure:_____________________________________  Arrival Time for Procedure:______________________________  Instructions regarding medication:    _X___:  Hold betablocker(s) night before procedure and morning of procedure -- Metoprolol     PLEASE NOTIFY THE OFFICE AT LEAST 24 HOURS IN ADVANCE IF YOU ARE UNABLE TO KEEP YOUR APPOINTMENT.  [phone] AND  PLEASE NOTIFY NUCLEAR MEDICINE AT [facility] AT LEAST 24 HOURS IN ADVANCE IF YOU ARE UNABLE TO KEEP YOUR APPOINTMENT. [phone]  How to prepare for your Myoview test:  Do not eat or drink after midnight No caffeine for 24 hours prior to test No smoking 24 hours prior to test. Your medication may be taken with water.  If your doctor stopped a medication because of this test, do not take that medication. Ladies, please do not wear dresses.  Skirts or pants are appropriate. Please wear a short sleeve shirt. No perfume, cologne or lotion. Wear comfortable walking shoes. No heels!     Follow-Up: At [facility], you and your health needs are our priority.  As part of our continuing mission to provide you with exceptional heart care, we have created designated Provider Care Teams.  These Care Teams include your primary Cardiologist (physician) and Advanced Practice Providers (APPs -  Physician Assistants and Nurse Practitioners) who all work together to provide you with the care you need, when you need it.  We recommend signing up for the patient portal called "MyChart".  Sign up information is provided on this After Visit Summary.  MyChart is used to connect with patients for Virtual Visits (Telemedicine).  Patients are able to view lab/test results, encounter notes, upcoming appointments, etc.  Non-urgent messages can be sent to your provider as well.   To learn more about what you  can do with MyChart, go to [email or web address].    Your next appointment:   2 month(s)  The format for your next appointment:   In Person  Provider:   You may see Dr. Azucena Cecil or one of the following Advanced Practice Providers on your designated Care Team:   Nicolasa Ducking, NP Eula Listen, PA-C Marisue Ivan, PA-C [place], [place]     Signed, Debbe Odea, MD  10/25/2021 12:52 PM    [facility]

## 2021-10-25 NOTE — Patient Instructions (Signed)
Medication Instructions:   STOP Pravastatin START Lipitor - Take one tablet (80 mg) by mouth daily.  START Plavix - Take one tablet (75 mg) by mouth daily.   *If you need a refill on your cardiac medications before your next appointment, please call your pharmacy*   Lab Work:  Your physician recommends that you return for lab work in: 2 months here at our office. You will need to be fasting.   If you have labs (blood work) drawn today and your tests are completely normal, you will receive your results only by: MyChart Message (if you have MyChart) OR A paper copy in the mail If you have any lab test that is abnormal or we need to change your treatment, we will call you to review the results.   Testing/Procedures:  [facility] MYOVIEW  Your caregiver has ordered a Stress Test with nuclear imaging. The purpose of this test is to evaluate the blood supply to your heart muscle. This procedure is referred to as a "Non-Invasive Stress Test." This is because other than having an IV started in your vein, nothing is inserted or "invades" your body. Cardiac stress tests are done to find areas of poor blood flow to the heart by determining the extent of coronary artery disease (CAD). Some patients exercise on a treadmill, which naturally increases the blood flow to your heart, while others who are  unable to walk on a treadmill due to physical limitations have a pharmacologic/chemical stress agent called Lexiscan . This medicine will mimic walking on a treadmill by temporarily increasing your coronary blood flow.   Please note: these test may take anywhere between 2-4 hours to complete  PLEASE REPORT TO [facility] MEDICAL MALL ENTRANCE  THE VOLUNTEERS AT THE FIRST DESK WILL DIRECT YOU WHERE TO GO  Date of Procedure:_____________________________________  Arrival Time for Procedure:______________________________  Instructions regarding medication:    _X___:  Hold betablocker(s) night before procedure and  morning of procedure -- Metoprolol     PLEASE NOTIFY THE OFFICE AT LEAST 24 HOURS IN ADVANCE IF YOU ARE UNABLE TO KEEP YOUR APPOINTMENT.  [phone] AND  PLEASE NOTIFY NUCLEAR MEDICINE AT [facility] AT LEAST 24 HOURS IN ADVANCE IF YOU ARE UNABLE TO KEEP YOUR APPOINTMENT. [phone]  How to prepare for your Myoview test:  Do not eat or drink after midnight No caffeine for 24 hours prior to test No smoking 24 hours prior to test. Your medication may be taken with water.  If your doctor stopped a medication because of this test, do not take that medication. Ladies, please do not wear dresses.  Skirts or pants are appropriate. Please wear a short sleeve shirt. No perfume, cologne or lotion. Wear comfortable walking shoes. No heels!     Follow-Up: At [facility], you and your health needs are our priority.  As part of our continuing mission to provide you with exceptional heart care, we have created designated Provider Care Teams.  These Care Teams include your primary Cardiologist (physician) and Advanced Practice Providers (APPs -  Physician Assistants and Nurse Practitioners) who all work together to provide you with the care you need, when you need it.  We recommend signing up for the patient portal called "MyChart".  Sign up information is provided on this After Visit Summary.  MyChart is used to connect with patients for Virtual Visits (Telemedicine).  Patients are able to view lab/test results, encounter notes, upcoming appointments, etc.  Non-urgent messages can be sent to your provider as well.  To learn more about what you can do with MyChart, go to [email or web address].    Your next appointment:   2 month(s)  The format for your next appointment:   In Person  Provider:   You may see Dr. Azucena Cecil or one of the following Advanced Practice Providers on your designated Care Team:   Nicolasa Ducking, NP Eula Listen, PA-C Marisue Ivan, PA-C Cadence [place], [place]

## 2021-10-27 ENCOUNTER — Ambulatory Visit: Payer: 59 | Admitting: Internal Medicine

## 2021-10-27 ENCOUNTER — Encounter: Payer: Self-pay | Admitting: Internal Medicine

## 2021-10-27 ENCOUNTER — Other Ambulatory Visit: Payer: Self-pay

## 2021-10-27 VITALS — BP 128/82 | HR 81 | Temp 98.6°F | Ht 69.02 in | Wt 254.0 lb

## 2021-10-27 DIAGNOSIS — K760 Fatty (change of) liver, not elsewhere classified: Secondary | ICD-10-CM

## 2021-10-27 NOTE — Progress Notes (Signed)
There were no vitals taken for this visit.   Subjective:    Patient ID: Bianca Michael, female    DOB: February 19, 1966, 55 y.o.   MRN: [id number]  No chief complaint on file.   HPI: Bianca Michael is a 55 y.o. female  [place] at the mall on side of her head, stable. No dizzines.stable. no fall again per pt.  Shortness of Breath This is a chronic (seen cards and pulm to have LEXI scan of the heart sec to CT scan showed triple vessle CAD) problem.   No chief complaint on file.   Relevant past medical, surgical, family and social history reviewed and updated as indicated. Interim medical history since our last visit reviewed. Allergies and medications reviewed and updated.  Review of Systems  Respiratory:  Positive for shortness of breath.    Per HPI unless specifically indicated above     Objective:    There were no vitals taken for this visit.  Wt Readings from Last 3 Encounters:  10/25/21 251 lb (113.9 kg)  10/11/21 251 lb 8 oz (114.1 kg)  10/05/21 251 lb (113.9 kg)    Physical Exam  Results for orders placed or performed in visit on 10/07/21  ECHOCARDIOGRAM COMPLETE  Result Value Ref Range   AR max vel 2.74 cm2   AV Peak grad 9.0 mmHg   Ao pk vel 1.50 m/s   S' Lateral 2.00 cm   Area-P 1/2 2.51 cm2   AV Area VTI 2.70 cm2   AV Mean grad 5.0 mmHg   Single Plane A4C EF 63.6 %   AV Area mean vel 2.85 cm2        Current Outpatient Medications:    albuterol (VENTOLIN HFA) 108 (90 Base) MCG/ACT inhaler, Inhale into the lungs., Disp: , Rfl:    Ascorbic Acid (VITAMIN C) 1000 MG tablet, Take 1,000 mg by mouth daily., Disp: , Rfl:    atorvastatin (LIPITOR) 80 MG tablet, Take 1 tablet (80 mg total) by mouth daily., Disp: 90 tablet, Rfl: 3   buPROPion (WELLBUTRIN XL) 300 MG 24 hr tablet, Take by mouth., Disp: , Rfl:    Cholecalciferol (VITAMIN D3 ULTRA STRENGTH PO), Take 5,000 Int'l Units/day by mouth., Disp: , Rfl:    clopidogrel (PLAVIX) 75 MG tablet, Take 1 tablet  (75 mg total) by mouth daily., Disp: 90 tablet, Rfl: 3   halobetasol (ULTRAVATE) 0.05 % ointment, Apply topically 2 (two) times daily., Disp: , Rfl:    hydroxychloroquine (PLAQUENIL) 200 MG tablet, Take 2 tablets (400 mg total) by mouth daily., Disp: 60 tablet, Rfl: 5   hydrOXYzine (ATARAX/VISTARIL) 25 MG tablet, Take by mouth., Disp: , Rfl:    levothyroxine (SYNTHROID) 112 MCG tablet, Take 1 tablet (112 mcg total) by mouth daily., Disp: 90 tablet, Rfl: 0   magnesium oxide (MAG-OX) 400 MG tablet, Take by mouth., Disp: , Rfl:    metoprolol tartrate (LOPRESSOR) 25 MG tablet, Take by mouth 2 (two) times daily., Disp: , Rfl:    Multiple Vitamins-Minerals (WOMENS MULTI) CAPS, Take 1 capsule by mouth daily., Disp: , Rfl:    naltrexone (DEPADE) 50 MG tablet, Take 0.5 tablets (25 mg total) by mouth daily., Disp: 30 tablet, Rfl: 4   omeprazole (PRILOSEC) 40 MG capsule, Take 1 capsule (40 mg total) by mouth daily., Disp: 30 capsule, Rfl: 3   sertraline (ZOLOFT) 25 MG tablet, Take by mouth., Disp: , Rfl:    zinc gluconate 50 MG tablet, Take 50 mg by mouth  daily., Disp: , Rfl:    Unusual findings in the lungs which could be indicative of early or mild interstitial lung disease, with a spectrum of findings at this time categorized as most suggestive of an alternative diagnosis (not usual interstitial pneumonia) per current ATS guidelines. Repeat high-resolution chest CT is recommended in 6 months to assess for temporal changes in the appearance of the lung parenchyma. 2. Multiple nodular areas of architectural distortion, strongly favored to represent areas of post infectious or inflammatory scarring given their associated areas of bronchiectasis. Close attention at time of repeat high-resolution chest CT is recommended to ensure stability. 3. Aortic atherosclerosis, in addition to left main and 3 vessel coronary artery disease. Please note that although the presence of coronary artery calcium documents  the presence of coronary artery disease, the severity of this disease and any potential stenosis cannot be assessed on this non-gated CT examination. Assessment for potential risk factor modification, dietary therapy or pharmacologic therapy may be warranted, if clinically indicated. 4. Severe hepatic steatosis.   Aortic Atherosclerosis (ICD10-I70.0).   Assessment & Plan:  Hypothyroidism : AI thyroid disease :  Dose increased to 112 mcg.   2. Severe hepatosis - non alcoholic , will refer to GI asap  3. HLD is on lipitor 80 mg daily per cards.   4. CAD : is on plavix , lipitor and  To have a LEXI scan   5. Htn is on metroprolol 25 bid.  Continue current meds.  Medication compliance emphasised. pt advised to keep Bp logs. Pt verbalised understanding of the same. Pt to have a low salt diet . Exercise to reach a goal of at least 150 mins a week.  lifestyle modifications explained and pt understands importance of the above.   6. Is on plaquenil for seronegative inflammatory arthritis Per rheumatology @ [place] Dr. Dimple Casey.   7 . Obesity : is on naltrexone and Wellbutrin for such - is on this for weight loss.  Portion control and avoiding high carb low fat diet advised.  Diet plan given to pt   exercise plan given and encouraged.  To increase exercise to 150 mins a week ie 21/2 hours a week. Pt verbalises understanding of the above.   Problem List Items Addressed This Visit   None    No orders of the defined types were placed in this encounter.    No orders of the defined types were placed in this encounter.    Follow up plan: No follow-ups on file.

## 2021-10-28 ENCOUNTER — Telehealth: Payer: Self-pay | Admitting: Cardiology

## 2021-10-28 NOTE — Telephone Encounter (Signed)
Patient would like to get her labs drawn at [facility] facility. Please call to advise.

## 2021-11-01 ENCOUNTER — Encounter
Admission: RE | Admit: 2021-11-01 | Discharge: 2021-11-01 | Disposition: A | Discharge status: Home / Self Care | Payer: 59 | Source: Ambulatory Visit | Attending: Cardiology | Admitting: Cardiology

## 2021-11-01 ENCOUNTER — Other Ambulatory Visit: Payer: Self-pay

## 2021-11-01 DIAGNOSIS — R0609 Other forms of dyspnea: Secondary | ICD-10-CM | POA: Insufficient documentation

## 2021-11-01 LAB — NM MYOCAR MULTI W/SPECT W/WALL MOTION / EF
Estimated workload: 1
Exercise duration (min): 1 min
Exercise duration (sec): 11 s
LV dias vol: 67 mL (ref 46–106)
LV sys vol: 31 mL
MPHR: 163 {beats}/min
Nuc Stress EF: 54 %
Peak HR: 91 {beats}/min
Percent HR: 55 %
Rest HR: 64 {beats}/min
Rest Nuclear Isotope Dose: 10.6 mCi
SDS: 11
SRS: 4
SSS: 20
ST Depression (mm): 0 mm
Stress Nuclear Isotope Dose: 31.3 mCi
TID: 1.03

## 2021-11-01 MED ORDER — TECHNETIUM TC 99M TETROFOSMIN IV KIT
10.0000 | PACK | Freq: Once | INTRAVENOUS | Status: AC | PRN
  Administered 2021-11-01: 10.55 via INTRAVENOUS

## 2021-11-01 MED ORDER — TECHNETIUM TC 99M TETROFOSMIN IV KIT
30.0000 | PACK | Freq: Once | INTRAVENOUS | Status: AC
  Administered 2021-11-01: 31.26 via INTRAVENOUS

## 2021-11-01 MED ORDER — REGADENOSON 0.4 MG/5ML IV SOLN
0.4000 mg | Freq: Once | INTRAVENOUS | Status: AC
  Administered 2021-11-01: 0.4 mg via INTRAVENOUS

## 2021-11-09 ENCOUNTER — Other Ambulatory Visit: Payer: Self-pay | Admitting: Nurse Practitioner

## 2021-11-09 NOTE — Telephone Encounter (Signed)
Patient returning call .  She is going to the lab inside [place] [facility].    Patient ok with having paper lab orders mailed to her home at confirmed Epic Address .

## 2021-11-09 NOTE — Telephone Encounter (Signed)
Called patient and left a VM requesting a call back. 

## 2021-11-10 NOTE — Telephone Encounter (Signed)
Lab order printed and mailed to patient's home address.

## 2021-11-26 ENCOUNTER — Other Ambulatory Visit: Payer: Self-pay | Admitting: Internal Medicine

## 2021-11-26 ENCOUNTER — Ambulatory Visit: Payer: 59

## 2021-11-26 ENCOUNTER — Other Ambulatory Visit: Payer: Self-pay

## 2021-11-26 DIAGNOSIS — G4733 Obstructive sleep apnea (adult) (pediatric): Secondary | ICD-10-CM | POA: Diagnosis not present

## 2021-11-26 DIAGNOSIS — G478 Other sleep disorders: Secondary | ICD-10-CM

## 2021-11-29 ENCOUNTER — Ambulatory Visit: Payer: 59 | Admitting: Pulmonary Disease

## 2021-11-29 ENCOUNTER — Other Ambulatory Visit: Payer: Self-pay

## 2021-11-29 ENCOUNTER — Other Ambulatory Visit: Payer: Self-pay | Admitting: Internal Medicine

## 2021-11-29 ENCOUNTER — Encounter: Payer: Self-pay | Admitting: Pulmonary Disease

## 2021-11-29 VITALS — BP 124/70 | HR 71 | Temp 98.2°F | Ht 69.0 in | Wt 251.0 lb

## 2021-11-29 DIAGNOSIS — J8410 Pulmonary fibrosis, unspecified: Secondary | ICD-10-CM

## 2021-11-29 DIAGNOSIS — R0602 Shortness of breath: Secondary | ICD-10-CM

## 2021-11-29 DIAGNOSIS — R490 Dysphonia: Secondary | ICD-10-CM

## 2021-11-29 DIAGNOSIS — U099 Post covid-19 condition, unspecified: Secondary | ICD-10-CM

## 2021-11-29 DIAGNOSIS — Z8616 Personal history of COVID-19: Secondary | ICD-10-CM | POA: Diagnosis not present

## 2021-11-29 DIAGNOSIS — G478 Other sleep disorders: Secondary | ICD-10-CM

## 2021-11-29 MED ORDER — STIOLTO RESPIMAT 2.5-2.5 MCG/ACT IN AERS: 2.0000 | INHALATION_SPRAY | Freq: Every day | RESPIRATORY_TRACT | 0 refills | Status: DC

## 2021-11-29 NOTE — Patient Instructions (Signed)
We will give you a trial of Stiolto 2 puffs daily.  We are avoiding steroid inhalers due to your issues with hoarseness.  Please let us know how this inhaler does for you.  We will refer patient to ENT for evaluation of her issues with hoarseness, these may be related to vocal cord dysfunction though this was not as evident on PFTs.  We will contact you with the results of your sleep study and if the sleep study needs to be repeated.   We will see the patient in follow-up in 3 months time she is to contact us prior to that time should any new difficulties arise.

## 2021-11-29 NOTE — Progress Notes (Signed)
Subjective:    Patient ID: Bianca Michael, female    DOB: [birth year], 55 y.o.   MRN: [id number] Chief Complaint  Patient presents with   Follow-up    Sob with exertion.     HPI Bianca Michael is a 55 year old remote former smoker (15 PY total) who presents for follow-up on an issue of dyspnea in the setting of prior severe COVID-19 infection and pulmonary embolism associated with the same.  This is a scheduled visit.  Recall she was initially evaluated on 06 September 2021.  At that time PFTs and 6 high-resolution CT chest and a 2D echo were ordered.  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  We discussed that weight loss can help Bianca Michael in this regard.  Bianca Michael dyspnea continues to be an issue and is out of proportion to Bianca Michael pulmonary function and findings on CT chest.  CT chest does show some mild fibrotic changes that appear to be postinflammatory.  She also showed evidence of potential three-vessel CAD and was  referred to cardiology.  2D echo showed grade 1 DD, no wall motion abnormalities and mild mitral regurgitation.  Myoview stress test showed no evidence of ischemia.  She continues to be plagued with issues with hoarseness.  In addition she has issues with daytime somnolence.  She had a home sleep study performed she is to bring the device in.  She states however that the night she did the study she could not sleep so data may be limited and may need to be repeated.  Bianca Michael dyspnea continues to be out of proportion to Bianca Michael pulmonary findings.  She sometimes gets a feeling that she cannot get a breath in this may be related to possible vocal cord dysfunction.  This was not evident on flow-volume loop on the PFTs however it warrants further investigation with direct laryngoscopy.  She has not had any recent fevers, chills or sweats.  No cough or sputum  production.  No hemoptysis.  Continues to complain of chest tightness and shortness of breath and inability to get a breath in particularly during exertion.  She gets erratic response from as needed albuterol.  She is applying for short-term disability.  No other issues endorsed today.  DATA: 09/07/2021  PFTs: FEV1 2.28 L or 86% predicted, FVC 2.57 L or 75% predicted, FEV1/FVC 89%, no bronchodilator response.  There is mild restrictive physiology due to ILD and/or obesity (ERV 24%) diffusion capacity was normal. 09/21/2021 CT chest high-resolution: Possible early interstitial lung disease areas of architectural distortion and groundglass attenuation likely representing infectious or inflammatory scarring.  Mild bronchiectasis. Evidence of three-vessel coronary artery disease.  Severe hepatic steatosis. 10/07/2021 2D echo: LVEF 60 to 65%, mild LVH.  Grade 1 DD.  Mild mitral regurg. 11/01/2021 Myoview: Normal stress test.   Review of Systems A 10 point review of systems was performed and it is as noted above otherwise negative.  Patient Active Problem List   Diagnosis Date Noted   Elevated serum creatinine 08/16/2021   Morbid obesity (HCC) 08/16/2021   Positive ANA (antinuclear antibody) 07/26/2021   Inflammatory arthritis 07/21/2021   Screening for heart disease 07/21/2021   COVID-19 07/21/2021   History of pulmonary embolism 07/21/2021   Anxiety 07/21/2021   Acute pulmonary embolism without acute cor pulmonale (HCC) 04/07/2021   Elevated liver enzymes 01/28/2021   Hyperlipidemia 01/21/2021   Chronic neck and  back pain 03/24/2020   Essential (primary) hypertension 03/13/2020   Major depressive disorder 03/13/2020   Chronic ethmoidal sinusitis 04/02/2019   Chronic frontal sinusitis 04/02/2019   Chronic maxillary sinusitis 04/02/2019   Nasal polyp 04/02/2019   Thyroid disease 06/19/2018   Social History   Tobacco Use   Smoking status: Former    Packs/day: 1.00    Years: 15.00     Pack years: 15.00    Types: Cigarettes    Quit date: 12/26/2000    Years since quitting: 20.9   Smokeless tobacco: Never  Substance Use Topics   Alcohol use: Yes    Comment: very rarely   No Known Allergies  Current Meds  Medication Sig   albuterol (VENTOLIN HFA) 108 (90 Base) MCG/ACT inhaler Inhale into the lungs.   Ascorbic Acid (VITAMIN C) 1000 MG tablet Take 1,000 mg by mouth daily.   atorvastatin (LIPITOR) 80 MG tablet Take 1 tablet (80 mg total) by mouth daily.   buPROPion (WELLBUTRIN XL) 300 MG 24 hr tablet Take by mouth.   Cholecalciferol (VITAMIN D3 ULTRA STRENGTH PO) Take 5,000 Int'l Units/day by mouth.   clopidogrel (PLAVIX) 75 MG tablet Take 1 tablet (75 mg total) by mouth daily.   halobetasol (ULTRAVATE) 0.05 % ointment Apply topically 2 (two) times daily.   hydroxychloroquine (PLAQUENIL) 200 MG tablet Take 2 tablets (400 mg total) by mouth daily.   hydrOXYzine (ATARAX/VISTARIL) 25 MG tablet Take by mouth.   levothyroxine (SYNTHROID) 112 MCG tablet TAKE 1 TABLET BY MOUTH EVERY DAY   magnesium oxide (MAG-OX) 400 MG tablet Take by mouth.   metoprolol tartrate (LOPRESSOR) 25 MG tablet Take by mouth 2 (two) times daily.   Multiple Vitamins-Minerals (WOMENS MULTI) CAPS Take 1 capsule by mouth daily.   omeprazole (PRILOSEC) 40 MG capsule Take 1 capsule (40 mg total) by mouth daily.   sertraline (ZOLOFT) 25 MG tablet Take by mouth.   Tiotropium Bromide-Olodaterol (STIOLTO RESPIMAT) 2.5-2.5 MCG/ACT AERS Inhale 2 puffs into the lungs daily.   zinc gluconate 50 MG tablet Take 50 mg by mouth daily.    There is no immunization history on file for this patient.     Objective:   Physical Exam BP 124/70 (BP Location: Left Arm, Cuff Size: Large)   Pulse 71   Temp 98.2 F (36.8 C) (Oral)   Ht 5\' 9"  (1.753 m)   Wt 251 lb (113.9 kg) Comment: per patient.  SpO2 96%   BMI 37.07 kg/m  GENERAL: Obese woman, no acute distress, fully ambulatory, raspy voice, no conversational  dyspnea. HEAD: Normocephalic, atraumatic.  EYES: Pupils equal, round, reactive to light.  No scleral icterus.  MOUTH: Nose/mouth/throat not examined due to masking requirements for COVID 19. NECK: Supple. No thyromegaly. Trachea midline. No JVD.  No adenopathy.  Well-healed tracheostomy scar. PULMONARY: Good air entry bilaterally.  No adventitious sounds. CARDIOVASCULAR: S1 and S2. Regular rate and rhythm.  No rubs, murmurs or gallops heard. ABDOMEN: Obese, well-healed PEG scar. MUSCULOSKELETAL: No joint deformity, no clubbing, no edema.  NEUROLOGIC: No focal deficit, no gait disturbance, speech is fluent. SKIN: Intact,warm,dry.  Mild discoloration of the toes distally, no frank ischemia.  Regrowing toenails. PSYCH: Mood and behavior normal.         Assessment & Plan:     ICD-10-CM   1. Shortness of breath  R06.02    Out of proportion to pulmonary findings Trial of Stiolto (avoiding ICS due to hoarseness) Follow-up 3 months time call sooner should any problems  arise    2. 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 Non-restorative sleep  G47.8    Needs to bring equipment for interpretation May need repeat sleep study as patient states she could not sleep    5. Personal history of COVID-19  Z86.16    This issue adds complexity to Bianca Michael management Postinflammatory pulmonary fibrosis as sequela     Orders Placed This Encounter  Procedures   Ambulatory referral to ENT    Referral Priority:   Routine    Referral Type:   Consultation    Referral Reason:   Specialty Services Required    Requested Specialty:   Otolaryngology    Number of Visits Requested:   1   Meds ordered this encounter  Medications   Tiotropium Bromide-Olodaterol (STIOLTO RESPIMAT) 2.5-2.5 MCG/ACT AERS     Sig: Inhale 2 puffs into the lungs daily.    Dispense:  4 g    Refill:  0   We will see the patient in follow-up in 3 months time.  We will call Bianca Michael with the results of Bianca Michael sleep study once the data is downloaded.  She may very well need repeat study due to the issues noted above.   Gailen Shelter, MD Advanced Bronchoscopy P[facility]    *This note was dictated using voice recognition software/Dragon.  Despite best efforts to proofread, errors can occur which can change the meaning.  Any change was purely unintentional.

## 2021-12-05 DIAGNOSIS — G4733 Obstructive sleep apnea (adult) (pediatric): Secondary | ICD-10-CM | POA: Diagnosis not present

## 2021-12-06 ENCOUNTER — Ambulatory Visit
Admission: RE | Admit: 2021-12-06 | Discharge: 2021-12-06 | Disposition: A | Discharge status: Home / Self Care | Payer: Disability Insurance | Source: Ambulatory Visit | Attending: Internal Medicine | Admitting: Internal Medicine

## 2021-12-06 ENCOUNTER — Telehealth: Payer: Self-pay | Admitting: Pulmonary Disease

## 2021-12-06 DIAGNOSIS — U099 Post covid-19 condition, unspecified: Secondary | ICD-10-CM

## 2021-12-06 NOTE — Telephone Encounter (Signed)
Dr Craige Cotta has read the home sleep study and it has been scanned to the pt's chart.

## 2021-12-06 NOTE — Telephone Encounter (Signed)
Spoke to patient of results and voiced her understanding.  She would like to contact insurance to find out cost of machine. She will call back with update.

## 2021-12-08 ENCOUNTER — Ambulatory Visit (INDEPENDENT_AMBULATORY_CARE_PROVIDER_SITE_OTHER): Payer: 59 | Admitting: Gastroenterology

## 2021-12-08 ENCOUNTER — Encounter: Payer: Self-pay | Admitting: Gastroenterology

## 2021-12-08 ENCOUNTER — Other Ambulatory Visit: Payer: Self-pay

## 2021-12-08 VITALS — BP 136/83 | HR 77 | Temp 97.6°F | Ht 69.0 in | Wt 256.0 lb

## 2021-12-08 DIAGNOSIS — K760 Fatty (change of) liver, not elsewhere classified: Secondary | ICD-10-CM

## 2021-12-08 NOTE — Progress Notes (Signed)
Gastroenterology Consultation  Referring Provider:     Loura Pardon, MD Primary Care Physician:  Loura Pardon, MD Primary Gastroenterologist:  Dr. Servando Snare     Reason for Consultation:     Hepatic steatosis        HPI:   Bianca Michael is a 55 y.o. y/o female referred for consultation & management of Hepatic steatosis by Dr. Charlotta Newton, Avanti, MD.  This patient comes in today with a history of having a CT scan of the chest which showed changes consistent with Covid pneumonia.  This CT scan was done in January at an outside facility.  The patient also was seen to have hepatic steatosis on that CT scan.  Her liver enzymes have been historically normal.  The patient's weight has been around 250 pounds.  She also had a lipid panel showing her cholesterol being over 300. The patient had a repeat CT scan in September that showed severe hepatic steatosis. The patients mother had colon cancer at 62 and the patient had a colonoscopy last year with a repeat recommended at 5 years. The patient reports that she was in the hospital for 2 months and was intubated and had a tracheostomy due to her Covid pneumonia.  She reports that she had not been vaccinated nor does she plan on getting vaccinated. The patient reports that she has tried medications to lose weight in the past with some success but is having a hard time getting back on those medications.  She also denies any diabetes but it knowledge is her high cholesterol which she states is being treated by her cardiologist.  Past Medical History:  Diagnosis Date   Anxiety    Chronic kidney disease    COVID-19 12/2020   Patient was hospitalized   Depression    Hyperlipidemia    Hypertension    Pneumothorax    Left lung   Pulmonary embolism (HCC)    Thyroid disease     Past Surgical History:  Procedure Laterality Date   CARPAL TUNNEL RELEASE     CHOLECYSTECTOMY     TONSILLECTOMY  1970   TUBAL LIGATION      Prior to Admission medications    Medication Sig Start Date End Date Taking? Authorizing Provider  albuterol (VENTOLIN HFA) 108 (90 Base) MCG/ACT inhaler Inhale into the lungs. 11/20/20   [provider]  Ascorbic Acid (VITAMIN C) 1000 MG tablet Take 1,000 mg by mouth daily.    [provider]  atorvastatin (LIPITOR) 80 MG tablet Take 1 tablet (80 mg total) by mouth daily. 10/25/21   Debbe Odea, MD  buPROPion (WELLBUTRIN XL) 300 MG 24 hr tablet Take by mouth. 03/19/18   [provider]  Cholecalciferol (VITAMIN D3 ULTRA STRENGTH PO) Take 5,000 Int'l Units/day by mouth.    [provider]  clopidogrel (PLAVIX) 75 MG tablet Take 1 tablet (75 mg total) by mouth daily. 10/25/21   Debbe Odea, MD  halobetasol (ULTRAVATE) 0.05 % ointment Apply topically 2 (two) times daily. 05/04/21 05/04/22  [provider]  hydroxychloroquine (PLAQUENIL) 200 MG tablet Take 2 tablets (400 mg total) by mouth daily. 10/11/21   Fuller Plan, MD  hydrOXYzine (ATARAX/VISTARIL) 25 MG tablet Take by mouth. 04/16/21   [provider]  levothyroxine (SYNTHROID) 112 MCG tablet TAKE 1 TABLET BY MOUTH EVERY DAY 11/09/21   McElwee, Lauren A, NP  magnesium oxide (MAG-OX) 400 MG tablet Take by mouth.    [provider]  metoprolol tartrate (LOPRESSOR)  25 MG tablet Take by mouth 2 (two) times daily. 03/18/21   [provider]  Multiple Vitamins-Minerals (WOMENS MULTI) CAPS Take 1 capsule by mouth daily.    [provider]  omeprazole (PRILOSEC) 40 MG capsule Take 1 capsule (40 mg total) by mouth daily. 10/07/21   Vigg, Avanti, MD  sertraline (ZOLOFT) 25 MG tablet Take by mouth. 03/18/21   [provider]  Tiotropium Bromide-Olodaterol (STIOLTO RESPIMAT) 2.5-2.5 MCG/ACT AERS Inhale 2 puffs into the lungs daily. 11/29/21   Salena Saner, MD  zinc gluconate 50 MG tablet Take 50 mg by mouth daily.    [provider]    Family History  Problem Relation  Age of Onset   Breast cancer Mother 5   Colon cancer Mother    Heart attack Father    Heart disease Father    High Cholesterol Father    Thyroid disease Sister    Breast cancer Sister    Thyroid disease Sister    Thyroid disease Sister    Lupus Sister    Rheum arthritis Sister    Thyroid disease Daughter      Social History   Tobacco Use   Smoking status: Former    Packs/day: 1.00    Years: 15.00    Pack years: 15.00    Types: Cigarettes    Quit date: 12/26/2000    Years since quitting: 20.9   Smokeless tobacco: Never  Vaping Use   Vaping Use: Never used  Substance Use Topics   Alcohol use: Yes    Comment: very rarely   Drug use: Not Currently    Allergies as of 12/08/2021   (No Known Allergies)    Review of Systems:    All systems reviewed and negative except where noted in HPI.   Physical Exam:  There were no vitals taken for this visit. No LMP recorded. Patient is postmenopausal. General:   Alert,  Well-developed, well-nourished, pleasant and cooperative in NAD Head:  Normocephalic and atraumatic. Eyes:  Sclera clear, no icterus.   Conjunctiva pink. Ears:  Normal auditory acuity. Neck:  Supple; no masses or thyromegaly. Lungs:  Respirations even and unlabored.  Clear throughout to auscultation.   No wheezes, crackles, or rhonchi. No acute distress. Heart:  Regular rate and rhythm; no murmurs, clicks, rubs, or gallops. Abdomen:  Normal bowel sounds.  No bruits.  Soft, non-tender and non-distended without masses, hepatosplenomegaly or hernias noted.  No guarding or rebound tenderness.  Negative Carnett sign.   Rectal:  Deferred.  Pulses:  Normal pulses noted. Extremities:  No clubbing or edema.  No cyanosis. Neurologic:  Alert and oriented x3;  grossly normal neurologically. Skin:  Intact without significant lesions or rashes.  No jaundice. Lymph Nodes:  No significant cervical adenopathy. Psych:  Alert and cooperative. Normal mood and affect.  Imaging  Studies: DG Lumbar Spine 2-3 Views  Result Date: 12/07/2021 CLINICAL DATA:  Post COVID syndrome.  Joint pain. EXAM: LUMBAR SPINE - 2-3 VIEW COMPARISON:  No prior. FINDINGS: Lumbar spine numbered the lowest segmented appearing lumbar shaped vertebrae on lateral view as L5. Diffuse degenerative change with multilevel degenerative endplate osteophyte formation. No acute bony abnormality. No evidence of fracture. 4 mm anterolisthesis L5-S1. Aortoiliac atherosclerotic vascular calcification. IMPRESSION: 1. Diffuse degenerative change with multilevel degenerative endplate osteophyte formation. 4 mm anterolisthesis L5 on S1. No acute bony abnormality identified. 2.  Aortoiliac atherosclerotic vascular disease. Electronically Signed   By: Maisie Fus  Register M.D.   On: 12/07/2021 07:11  DG Shoulder Left  Result Date: 12/07/2021 CLINICAL DATA:  Post COVID syndrome.  Joint pain. EXAM: LEFT SHOULDER - 2+ VIEW COMPARISON:  CT chest 09/21/2021. FINDINGS: Mild acromioclavicular degenerative change. No acute bony abnormality. No evidence of fracture or dislocation. IMPRESSION: Mild acromioclavicular degenerative change.  No acute abnormality. Electronically Signed   By: Maisie Fus  Register M.D.   On: 12/07/2021 07:13    Assessment and Plan:   Bianca Michael is a 55 y.o. y/o female who comes in today with a history of fatty liver disease and reports a family history of a mother with colon cancer in her 69s.  The patient will need a repeat colonoscopy in 4 years since she states she had a colonoscopy last year. The patient will have her labs checked for her immunity to hepatitis A and hepatitis B and will be vaccinated accordingly. She has been informed of the Mediterranean diet and the need to lose weight to decrease the amount of fat in her liver.  The patient understands the plan and agrees with it.  No further workup needs to be done at this time with this patient.    Midge Minium, MD. Clement[facility] Graham    Note: This  dictation was prepared with Dragon dictation along with smaller phrase technology. Any transcriptional errors that result from this process are unintentional.

## 2021-12-09 ENCOUNTER — Telehealth: Payer: Self-pay

## 2021-12-09 LAB — HEPATITIS B SURFACE ANTIBODY,QUALITATIVE: Hep B Surface Ab, Qual: REACTIVE

## 2021-12-09 LAB — HEPATITIS A ANTIBODY, TOTAL: hep A Total Ab: NEGATIVE

## 2021-12-09 NOTE — Telephone Encounter (Signed)
Pt notified of lab results. Pt scheduled for her Hep A vaccine on Monday, Dec 19th.

## 2021-12-09 NOTE — Telephone Encounter (Signed)
-----   Message from Midge Minium, MD sent at 12/09/2021  9:10 AM EST ----- Please let the patient know that the hepatitis B showed that she is immune to it but needs a vaccination for hepatitis A

## 2021-12-10 NOTE — Telephone Encounter (Signed)
Spoke to patient for update.  Patient stated that she has not made a decision yet. She will call back with update.  Will close encounter at this time.

## 2021-12-14 ENCOUNTER — Ambulatory Visit (INDEPENDENT_AMBULATORY_CARE_PROVIDER_SITE_OTHER): Payer: 59 | Admitting: Gastroenterology

## 2021-12-14 DIAGNOSIS — Z9229 Personal history of other drug therapy: Secondary | ICD-10-CM

## 2021-12-14 DIAGNOSIS — Z23 Encounter for immunization: Secondary | ICD-10-CM | POA: Diagnosis not present

## 2021-12-14 DIAGNOSIS — R748 Abnormal levels of other serum enzymes: Secondary | ICD-10-CM

## 2021-12-15 NOTE — Progress Notes (Signed)
Vaccination

## 2021-12-24 ENCOUNTER — Ambulatory Visit: Payer: 59 | Admitting: Nurse Practitioner

## 2021-12-24 ENCOUNTER — Encounter: Payer: Self-pay | Admitting: Nurse Practitioner

## 2021-12-24 ENCOUNTER — Other Ambulatory Visit: Payer: Self-pay

## 2021-12-24 VITALS — BP 114/76 | HR 69 | Temp 98.6°F | Ht 69.02 in | Wt 262.0 lb

## 2021-12-24 DIAGNOSIS — R051 Acute cough: Secondary | ICD-10-CM

## 2021-12-24 DIAGNOSIS — J0100 Acute maxillary sinusitis, unspecified: Secondary | ICD-10-CM

## 2021-12-24 LAB — VERITOR FLU A/B WAIVED
Influenza A: NEGATIVE
Influenza B: NEGATIVE

## 2021-12-24 MED ORDER — AMOXICILLIN-POT CLAVULANATE 875-125 MG PO TABS: 1.0000 | ORAL_TABLET | Freq: Two times a day (BID) | ORAL | 0 refills | Status: DC

## 2021-12-24 MED ORDER — HYDROCOD POLST-CPM POLST ER 10-8 MG/5ML PO SUER: 5.0000 mL | Freq: Two times a day (BID) | ORAL | 0 refills | Status: DC | PRN

## 2021-12-24 NOTE — Progress Notes (Signed)
° °Acute Office Visit ° °Subjective:  ° ° Patient ID: Bianca Michael, female    DOB: 02/03/1966, 55 y.o.   MRN: [id number] ° °Chief Complaint  °Patient presents with  ° cough  °  All started about a week ago, has been taking Mucinex and Robatussin  ° sinus pressure  ° Sore Throat  ° ° °HPI °Patient is in today for cough, sore throat, and sinus pressure that started about a week ago. ° °UPPER RESPIRATORY TRACT INFECTION ° °Worst symptom: sinus pressure °Fever: no °Cough: yes °Shortness of breath: yes - stable °Wheezing: no °Chest pain: yes, with cough °Chest tightness: no °Chest congestion: no °Nasal congestion: yes °Runny nose: yes °Post nasal drip: yes °Sneezing: yes °Sore throat: yes °Swollen glands: no °Sinus pressure: yes °Headache: yes °Face pain: no °Toothache: no °Ear pain: yes  left °Ear pressure: no  °Eyes red/itching:no °Eye drainage/crusting: no  °Vomiting: no °Rash: no °Fatigue: yes °Sick contacts: yes - daughter had recent sinus infection °Strep contacts: no  °Context: worse °Recurrent sinusitis: no °Relief with OTC cold/cough medications: no  °Treatments attempted: mucinex, robitussin  ° ° °Past Medical History:  °Diagnosis Date  ° Anxiety   ° Chronic kidney disease   ° COVID-19 12/2020  ° Patient was hospitalized  ° Depression   ° Hyperlipidemia   ° Hypertension   ° Pneumothorax   ° Left lung  ° Pulmonary embolism (HCC)   ° Thyroid disease   ° ° °Past Surgical History:  °Procedure Laterality Date  ° CARPAL TUNNEL RELEASE    ° CHOLECYSTECTOMY    ° TONSILLECTOMY  1970  ° TUBAL LIGATION    ° ° °Family History  °Problem Relation Age of Onset  ° Breast cancer Mother 41  ° Colon cancer Mother   ° Heart attack Father   ° Heart disease Father   ° High Cholesterol Father   ° Thyroid disease Sister   ° Breast cancer Sister   ° Thyroid disease Sister   ° Thyroid disease Sister   ° Lupus Sister   ° Rheum arthritis Sister   ° Thyroid disease Daughter   ° ° °Social History  ° °Socioeconomic History  ° Marital  status: Married  °  Spouse name: Not on file  ° Number of children: Not on file  ° Years of education: Not on file  ° Highest education level: Not on file  °Occupational History  ° Not on file  °Tobacco Use  ° Smoking status: Former  °  Packs/day: 1.00  °  Years: 15.00  °  Pack years: 15.00  °  Types: Cigarettes  °  Quit date: 12/26/2000  °  Years since quitting: 21.0  ° Smokeless tobacco: Never  °Vaping Use  ° Vaping Use: Never used  °Substance and Sexual Activity  ° Alcohol use: Yes  °  Comment: very rarely  ° Drug use: Not Currently  ° Sexual activity: Not Currently  °Other Topics Concern  ° Not on file  °Social History Narrative  ° Not on file  ° °Social Determinants of Health  ° °Financial Resource Strain: Not on file  °Food Insecurity: Not on file  °Transportation Needs: Not on file  °Physical Activity: Not on file  °Stress: Not on file  °Social Connections: Not on file  °Intimate Partner Violence: Not on file  ° ° °Outpatient Medications Prior to Visit  °Medication Sig Dispense Refill  ° albuterol (VENTOLIN HFA) 108 (90 Base) MCG/ACT inhaler Inhale into the lungs.    °   Ascorbic Acid (VITAMIN C) 1000 MG tablet Take 1,000 mg by mouth daily.    ° atorvastatin (LIPITOR) 80 MG tablet Take 1 tablet (80 mg total) by mouth daily. 90 tablet 3  ° buPROPion (WELLBUTRIN XL) 300 MG 24 hr tablet Take by mouth.    ° Cholecalciferol (VITAMIN D3 ULTRA STRENGTH PO) Take 5,000 Int'l Units/day by mouth.    ° clopidogrel (PLAVIX) 75 MG tablet Take 1 tablet (75 mg total) by mouth daily. 90 tablet 3  ° halobetasol (ULTRAVATE) 0.05 % ointment Apply topically 2 (two) times daily.    ° hydroxychloroquine (PLAQUENIL) 200 MG tablet Take 2 tablets (400 mg total) by mouth daily. 60 tablet 5  ° hydrOXYzine (ATARAX/VISTARIL) 25 MG tablet Take by mouth.    ° levothyroxine (SYNTHROID) 112 MCG tablet TAKE 1 TABLET BY MOUTH EVERY DAY 90 tablet 0  ° magnesium oxide (MAG-OX) 400 MG tablet Take by mouth.    ° metoprolol tartrate (LOPRESSOR) 25 MG  tablet Take by mouth 2 (two) times daily.    ° Multiple Vitamins-Minerals (WOMENS MULTI) CAPS Take 1 capsule by mouth daily.    ° omeprazole (PRILOSEC) 40 MG capsule Take 1 capsule (40 mg total) by mouth daily. 30 capsule 3  ° sertraline (ZOLOFT) 25 MG tablet Take by mouth.    ° Tiotropium Bromide-Olodaterol (STIOLTO RESPIMAT) 2.5-2.5 MCG/ACT AERS Inhale 2 puffs into the lungs daily. 4 g 0  ° zinc gluconate 50 MG tablet Take 50 mg by mouth daily.    ° °No facility-administered medications prior to visit.  ° ° °No Known Allergies ° °Review of Systems  °Constitutional:  Positive for fatigue. Negative for fever.  °HENT:  Positive for congestion, ear pain, postnasal drip, rhinorrhea, sinus pressure, sneezing and sore throat.   °Eyes: Negative.   °Respiratory:  Positive for cough and shortness of breath (intermittent, stable).   °Cardiovascular: Negative.   °Gastrointestinal: Negative.   °Endocrine: Negative.   °Genitourinary: Negative.   °Musculoskeletal:  Positive for myalgias.  °Skin: Negative.   °Neurological:  Positive for headaches. Negative for dizziness.  °Psychiatric/Behavioral: Negative.    ° °   °Objective:  °  °Physical Exam °Vitals and nursing note reviewed.  °Constitutional:   °   General: She is not in acute distress. °   Appearance: Normal appearance.  °HENT:  °   Head: Normocephalic.  °   Right Ear: Tympanic membrane, ear canal and external ear normal.  °   Left Ear: Tympanic membrane, ear canal and external ear normal.  °Eyes:  °   Conjunctiva/sclera: Conjunctivae normal.  °Cardiovascular:  °   Rate and Rhythm: Normal rate and regular rhythm.  °   Pulses: Normal pulses.  °   Heart sounds: Normal heart sounds.  °Pulmonary:  °   Effort: Pulmonary effort is normal.  °   Breath sounds: Normal breath sounds.  °Musculoskeletal:  °   Cervical back: Normal range of motion. No tenderness.  °Lymphadenopathy:  °   Cervical: No cervical adenopathy.  °Skin: °   General: Skin is warm.  °Neurological:  °   General: No  focal deficit present.  °   Mental Status: She is alert and oriented to person, place, and time.  °Psychiatric:     °   Mood and Affect: Mood normal.     °   Behavior: Behavior normal.     °   Thought Content: Thought content normal.     °   Judgment: Judgment normal.  ° ° °  BP 114/76    Pulse 69    Temp 98.6 °F (37 °C) (Oral)    Ht 5' 9.02" (1.753 m)    Wt 262 lb (118.8 kg)    SpO2 96%    BMI 38.67 kg/m²  °Wt Readings from Last 3 Encounters:  °12/24/21 262 lb (118.8 kg)  °12/08/21 256 lb (116.1 kg)  °11/29/21 251 lb (113.9 kg)  ° ° °Health Maintenance Due  °Topic Date Due  ° COVID-19 Vaccine (1) Never done  ° PAP SMEAR-Modifier  Never done  ° Zoster Vaccines- Shingrix (1 of 2) Never done  ° ° °There are no preventive care reminders to display for this patient. ° ° °Lab Results  °Component Value Date  ° TSH 6.590 (H) 08/11/2021  ° °Lab Results  °Component Value Date  ° WBC 6.3 08/19/2021  ° HGB 14.6 08/19/2021  ° HCT 44.2 08/19/2021  ° MCV 82.5 08/19/2021  ° PLT 214 08/19/2021  ° °Lab Results  °Component Value Date  ° NA 135 08/19/2021  ° K 4.5 08/19/2021  ° CO2 26 08/19/2021  ° GLUCOSE 107 (H) 08/19/2021  ° BUN 19 08/19/2021  ° CREATININE 1.27 (H) 08/19/2021  ° BILITOT 0.9 08/19/2021  ° ALKPHOS 69 08/19/2021  ° AST 33 08/19/2021  ° ALT 36 08/19/2021  ° PROT 8.1 08/19/2021  ° ALBUMIN 4.2 08/19/2021  ° CALCIUM 9.3 08/19/2021  ° ANIONGAP 8 08/19/2021  ° EGFR 57 (L) 08/11/2021  ° °Lab Results  °Component Value Date  ° CHOL 323 (H) 08/11/2021  ° °Lab Results  °Component Value Date  ° HDL 50 08/11/2021  ° °Lab Results  °Component Value Date  ° LDLCALC 197 (H) 08/11/2021  ° °Lab Results  °Component Value Date  ° TRIG 376 (H) 08/11/2021  ° °Lab Results  °Component Value Date  ° CHOLHDL 6.5 (H) 08/11/2021  ° °No results found for: HGBA1C ° °   °Assessment & Plan:  ° °Problem List Items Addressed This Visit   °None °Visit Diagnoses   ° ° Acute cough    -  Primary  ° Flu negative, covid-19 pending. Will treat for sinus  infection. tussionex prn cough. PDMP reviewed. F/U if not improving  ° Relevant Orders  ° Novel Coronavirus, NAA ([facility])  ° Veritor Flu A/B Waived  ° Acute non-recurrent maxillary sinusitis      ° Will treat with augmentin. Encourage rest and fluids. Cont. OTC meds, f/u if not improving.   ° Relevant Medications  ° amoxicillin-clavulanate (AUGMENTIN) 875-125 MG tablet  ° chlorpheniramine-HYDROcodone (TUSSIONEX PENNKINETIC ER) 10-8 MG/5ML SUER  ° °  ° ° ° °Meds ordered this encounter  °Medications  ° amoxicillin-clavulanate (AUGMENTIN) 875-125 MG tablet  °  Sig: Take 1 tablet by mouth 2 (two) times daily.  °  Dispense:  20 tablet  °  Refill:  0  ° chlorpheniramine-HYDROcodone (TUSSIONEX PENNKINETIC ER) 10-8 MG/5ML SUER  °  Sig: Take 5 mLs by mouth every 12 (twelve) hours as needed for cough.  °  Dispense:  50 mL  °  Refill:  0  ° ° ° °Lauren A McElwee, NP ° °

## 2021-12-25 LAB — SARS-COV-2, NAA 2 DAY TAT

## 2021-12-25 LAB — SPECIMEN STATUS REPORT

## 2021-12-25 LAB — NOVEL CORONAVIRUS, NAA: SARS-CoV-2, NAA: NOT DETECTED

## 2021-12-28 ENCOUNTER — Other Ambulatory Visit: Payer: Self-pay | Admitting: Internal Medicine

## 2021-12-28 NOTE — Telephone Encounter (Signed)
Medication Refill - Medication:buPROPion (WELLBUTRIN XL) 300 MG 24 hr tablet   Has the patient contacted their pharmacy? Automatic refill reqeust (Agent: If no, request that the patient contact the pharmacy for the refill. If patient does not wish to contact the pharmacy document the reason why and proceed with request.) (Agent: If yes, when and what did the pharmacy advise?)contact pcp, request was denied  Preferred Pharmacy (with phone number or street name):buPROPion (WELLBUTRIN XL) 300 MG 24 hr tablet  Has the patient been seen for an appointment in the last year OR does the patient have an upcoming appointment? yes  Agent: Please be advised that RX refills may take up to 3 business days. We ask that you follow-up with your pharmacy.

## 2021-12-29 NOTE — Telephone Encounter (Signed)
Requested medications are due for refill today.  unsure  Requested medications are on the active medications list.  yes  Last refill. 03/19/2018  Future visit scheduled.   yes  Notes to clinic.  Historical medication.    Requested Prescriptions  Pending Prescriptions Disp Refills   buPROPion (WELLBUTRIN XL) 300 MG 24 hr tablet      Sig: Take by mouth.     Psychiatry: Antidepressants - bupropion Passed - 12/28/2021  6:56 PM      Passed - Completed PHQ-2 or PHQ-9 in the last 360 days      Passed - Last BP in normal range    BP Readings from Last 1 Encounters:  12/24/21 114/76          Passed - Valid encounter within last 6 months    Recent Outpatient Visits           5 days ago Acute cough   [facility] McElwee, Lauren A, NP   2 months ago Fatty liver   [facility] Vigg, Avanti, MD   3 months ago Essential (primary) hypertension   [facility] Vigg, Avanti, MD   4 months ago Thyroid disease   [facility] McElwee, Lauren A, NP   5 months ago Arthritis   [facility] Vigg, Avanti, MD       Future Appointments             In 5 days Vigg, Avanti, MD [facility]   In 6 days Agbor-Etang, Arlys John, MD [facility]   In 2 weeks  [facility] [place]   In 3 months Rice, Jamesetta Orleans, MD [facility]

## 2021-12-30 ENCOUNTER — Other Ambulatory Visit: Payer: Self-pay | Admitting: Cardiology

## 2021-12-30 DIAGNOSIS — E782 Mixed hyperlipidemia: Secondary | ICD-10-CM | POA: Diagnosis not present

## 2021-12-31 LAB — LIPID PANEL
Chol/HDL Ratio: 3.9 ratio (ref 0.0–4.4)
Cholesterol, Total: 185 mg/dL (ref 100–199)
HDL: 47 mg/dL (ref >39)
LDL Chol Calc (NIH): 100 mg/dL — ABNORMAL HIGH (ref 0–99)
Triglycerides: 226 mg/dL — ABNORMAL HIGH (ref 0–149)
VLDL Cholesterol Cal: 38 mg/dL (ref 5–40)

## 2021-12-31 MED ORDER — BUPROPION HCL ER (XL) 300 MG PO TB24: 300.0000 mg | ORAL_TABLET | Freq: Every day | ORAL | 4 refills | Status: DC

## 2022-01-03 ENCOUNTER — Ambulatory Visit: Payer: 59 | Admitting: Internal Medicine

## 2022-01-03 ENCOUNTER — Other Ambulatory Visit: Payer: Self-pay

## 2022-01-03 ENCOUNTER — Encounter: Payer: Self-pay | Admitting: Internal Medicine

## 2022-01-03 VITALS — BP 109/77 | HR 68 | Temp 98.5°F | Ht 68.9 in | Wt 258.6 lb

## 2022-01-03 DIAGNOSIS — M1990 Unspecified osteoarthritis, unspecified site: Secondary | ICD-10-CM | POA: Diagnosis not present

## 2022-01-03 DIAGNOSIS — E785 Hyperlipidemia, unspecified: Secondary | ICD-10-CM | POA: Diagnosis not present

## 2022-01-03 MED ORDER — METOPROLOL TARTRATE 25 MG PO TABS: 25.0000 mg | ORAL_TABLET | Freq: Two times a day (BID) | ORAL | 1 refills | Status: DC

## 2022-01-03 MED ORDER — EZETIMIBE 10 MG PO TABS: 10.0000 mg | ORAL_TABLET | Freq: Every day | ORAL | 3 refills | Status: DC

## 2022-01-03 MED ORDER — SERTRALINE HCL 25 MG PO TABS: 25.0000 mg | ORAL_TABLET | Freq: Every day | ORAL | 1 refills | Status: DC

## 2022-01-03 NOTE — Progress Notes (Signed)
BP 109/77    Pulse 68    Temp 98.5 F (36.9 C) (Oral)    Ht 5' 8.9" (1.75 m)    Wt 258 lb 9.6 oz (117.3 kg)    SpO2 99%    BMI 38.30 kg/m    Subjective:    Patient ID: Bianca Michael, female    DOB: [birth year], 56 y.o.   MRN: [id number]  Chief Complaint  Patient presents with   Hypertension   Fatty liver   Depression    HPI: Bianca Michael is a 56 y.o. female  Hypertension  Depression       Heart Problem This is a chronic (CAD seen cards, liptior was bumped up to 80 mg per cards.) problem.   Chief Complaint  Patient presents with   Hypertension   Fatty liver   Depression    Relevant past medical, surgical, family and social history reviewed and updated as indicated. Interim medical history since our last visit reviewed. Allergies and medications reviewed and updated.  Review of Systems  Psychiatric/Behavioral:  Positive for depression.    Per HPI unless specifically indicated above     Objective:    BP 109/77    Pulse 68    Temp 98.5 F (36.9 C) (Oral)    Ht 5' 8.9" (1.75 m)    Wt 258 lb 9.6 oz (117.3 kg)    SpO2 99%    BMI 38.30 kg/m   Wt Readings from Last 3 Encounters:  01/03/22 258 lb 9.6 oz (117.3 kg)  12/24/21 262 lb (118.8 kg)  12/08/21 256 lb (116.1 kg)    Physical Exam Vitals and nursing note reviewed.  Constitutional:      General: She is not in acute distress.    Appearance: Normal appearance. She is not ill-appearing or diaphoretic.  Eyes:     Conjunctiva/sclera: Conjunctivae normal.  Cardiovascular:     Rate and Rhythm: Normal rate and regular rhythm.     Heart sounds: No murmur heard. Pulmonary:     Effort: No respiratory distress.     Breath sounds: No stridor. No wheezing or rhonchi.  Abdominal:     General: Abdomen is flat. Bowel sounds are normal. There is no distension.     Palpations: Abdomen is soft. There is no mass.     Tenderness: There is no abdominal tenderness. There is no guarding.  Skin:    General: Skin  is warm and dry.     Coloration: Skin is not jaundiced.     Findings: No erythema.  Neurological:     Mental Status: She is alert.  Psychiatric:        Mood and Affect: Mood normal.        Thought Content: Thought content normal.    Results for orders placed or performed in visit on 12/30/21  Lipid panel  Result Value Ref Range   Cholesterol, Total 185 100 - 199 mg/dL   Triglycerides 130226 (H) 0 - 149 mg/dL   HDL 47 >86>39 mg/dL   VLDL Cholesterol Cal 38 5 - 40 mg/dL   LDL Chol Calc (NIH) 578100 (H) 0 - 99 mg/dL   Chol/HDL Ratio 3.9 0.0 - 4.4 ratio        Current Outpatient Medications:    albuterol (VENTOLIN HFA) 108 (90 Base) MCG/ACT inhaler, Inhale into the lungs., Disp: , Rfl:    Ascorbic Acid (VITAMIN C) 1000 MG tablet, Take 1,000 mg by mouth daily., Disp: , Rfl:  atorvastatin (LIPITOR) 80 MG tablet, Take 1 tablet (80 mg total) by mouth daily., Disp: 90 tablet, Rfl: 3   buPROPion (WELLBUTRIN XL) 300 MG 24 hr tablet, Take 1 tablet (300 mg total) by mouth daily., Disp: 30 tablet, Rfl: 4   Cholecalciferol (VITAMIN D3 ULTRA STRENGTH PO), Take 5,000 Int'l Units/day by mouth., Disp: , Rfl:    clopidogrel (PLAVIX) 75 MG tablet, Take 1 tablet (75 mg total) by mouth daily., Disp: 90 tablet, Rfl: 3   ezetimibe (ZETIA) 10 MG tablet, Take 1 tablet (10 mg total) by mouth daily., Disp: 90 tablet, Rfl: 3   halobetasol (ULTRAVATE) 0.05 % ointment, Apply topically 2 (two) times daily., Disp: , Rfl:    hydroxychloroquine (PLAQUENIL) 200 MG tablet, Take 2 tablets (400 mg total) by mouth daily., Disp: 60 tablet, Rfl: 5   hydrOXYzine (ATARAX/VISTARIL) 25 MG tablet, Take by mouth., Disp: , Rfl:    levothyroxine (SYNTHROID) 112 MCG tablet, TAKE 1 TABLET BY MOUTH EVERY DAY, Disp: 90 tablet, Rfl: 0   magnesium oxide (MAG-OX) 400 MG tablet, Take by mouth., Disp: , Rfl:    Multiple Vitamins-Minerals (WOMENS MULTI) CAPS, Take 1 capsule by mouth daily., Disp: , Rfl:    omeprazole (PRILOSEC) 40  MG capsule, Take 1 capsule (40 mg total) by mouth daily., Disp: 30 capsule, Rfl: 3   Tiotropium Bromide-Olodaterol (STIOLTO RESPIMAT) 2.5-2.5 MCG/ACT AERS, Inhale 2 puffs into the lungs daily., Disp: 4 g, Rfl: 0   zinc gluconate 50 MG tablet, Take 50 mg by mouth daily., Disp: , Rfl:    metoprolol tartrate (LOPRESSOR) 25 MG tablet, Take 1 tablet (25 mg total) by mouth 2 (two) times daily., Disp: 90 tablet, Rfl: 1   sertraline (ZOLOFT) 25 MG tablet, Take 1 tablet (25 mg total) by mouth daily., Disp: 90 tablet, Rfl: 1    Assessment & Plan:   Hypothyroidism : AI thyroid disease :  Dose increased to 112 mcg.    2. Severe hepatosis - non alcoholic , will refer to GI asap  to have immunization for hep a and b   3. HLD is on lipitor 80 mg daily per cards.    4. CAD : is on plavix , lipitor and  Had a Nuclear scan:     The study is normal. The study is low risk.   No ST deviation was noted.   LV perfusion is normal. There is no evidence of ischemia. There is no evidence of infarction.   Left ventricular function is normal. End diastolic cavity size is normal. End systolic cavity size is normal.   CT attenuation images showed mild aortic and coronary calcifications  5. Htn is on metroprolol 25 bid.  Continue current meds.  Medication compliance emphasised. pt advised to keep Bp logs. Pt verbalised understanding of the same. Pt to have a low salt diet . Exercise to reach a goal of at least 150 mins a week.  lifestyle modifications explained and pt understands importance of the above.    6. 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 Dimple Casey.   fu and mx  per rheum  7 . Obesity : is on naltrexone and Wellbutrin for such - is on this for weight loss.  Portion control and avoiding high carb low fat diet advised.  Diet plan given to pt   exercise plan given and encouraged.  To increase exercise to 150 mins a week ie 21/2 hours a week. Pt  verbalises understanding  of the above.   Problem List Items Addressed This Visit       Musculoskeletal and Integument   Inflammatory arthritis     Other   Hyperlipidemia - Primary   Relevant Medications   ezetimibe (ZETIA) 10 MG tablet   metoprolol tartrate (LOPRESSOR) 25 MG tablet   Other Relevant Orders   CBC with Differential/Platelet   Comprehensive metabolic panel   Lipid panel   Urinalysis, Routine w reflex microscopic   TSH   Morbid obesity (HCC)     Orders Placed This Encounter  Procedures   CBC with Differential/Platelet   Comprehensive metabolic panel   Lipid panel   Urinalysis, Routine w reflex microscopic   TSH     Meds ordered this encounter  Medications   ezetimibe (ZETIA) 10 MG tablet    Sig: Take 1 tablet (10 mg total) by mouth daily.    Dispense:  90 tablet    Refill:  3   metoprolol tartrate (LOPRESSOR) 25 MG tablet    Sig: Take 1 tablet (25 mg total) by mouth 2 (two) times daily.    Dispense:  90 tablet    Refill:  1   sertraline (ZOLOFT) 25 MG tablet    Sig: Take 1 tablet (25 mg total) by mouth daily.    Dispense:  90 tablet    Refill:  1     Follow up plan: Return in about 6 weeks (around 02/14/2022).

## 2022-01-04 ENCOUNTER — Ambulatory Visit (INDEPENDENT_AMBULATORY_CARE_PROVIDER_SITE_OTHER): Payer: 59 | Admitting: Cardiology

## 2022-01-04 ENCOUNTER — Encounter: Payer: Self-pay | Admitting: Cardiology

## 2022-01-04 VITALS — BP 130/70 | HR 70 | Ht 64.0 in | Wt 259.1 lb

## 2022-01-04 DIAGNOSIS — R0609 Other forms of dyspnea: Secondary | ICD-10-CM

## 2022-01-04 DIAGNOSIS — I10 Essential (primary) hypertension: Secondary | ICD-10-CM | POA: Diagnosis not present

## 2022-01-04 DIAGNOSIS — I2510 Atherosclerotic heart disease of native coronary artery without angina pectoris: Secondary | ICD-10-CM | POA: Diagnosis not present

## 2022-01-04 DIAGNOSIS — E782 Mixed hyperlipidemia: Secondary | ICD-10-CM | POA: Diagnosis not present

## 2022-01-04 DIAGNOSIS — R0602 Shortness of breath: Secondary | ICD-10-CM | POA: Diagnosis not present

## 2022-01-04 DIAGNOSIS — U099 Post covid-19 condition, unspecified: Secondary | ICD-10-CM | POA: Diagnosis not present

## 2022-01-04 NOTE — Progress Notes (Signed)
Cardiology Office Note:    Date:  01/04/2022   ID:  [name], DOB [birth year], MRN [id number]  PCP:  Loura PardonVigg, Avanti, MD   [facility] Providers Cardiologist:  None     Referring MD: Loura PardonVigg, Avanti, MD   Chief Complaint  Patient presents with   Other    2 month f/u no complaints today. Meds reviewed verbally with pt.    History of Present Illness:    Bianca SequinCatherine Mary Michael is a 56 y.o. female with a hx of coronary calcifications (on CT chest), hypertension, hyperlipidemia, PE, CKD former smoker who presents for follow-up.  Previously seen due to aortic and coronary artery calcification and shortness of breath.   Plavix and Lipitor 80 mg was started.  Prior echo showed preserved ejection fraction.  Lexiscan Myoview was ordered to evaluate any significant ischemia.  He is scheduled for PFTs today.  Prior chest CT shows mild interstitial lung disease.  She presents for follow-up, has no new complaints or concerns today.  Takes medications as prescribed.  Prior notes/studies Echocardiogram obtained 09/2021 showed EF 60 to 65%, impaired relaxation. Shortness of breath since COVID diagnosis January 2022. High-resolution chest CT 08/2021 three-vessel coronary calcification, mild interstitial lung disease Follows up with nephrology, she was advised not to take NSAIDs or aspirin due to CKD.   Her father has a history of heart attack in his 5040s.  Patient diagnosed with COVID in January 2022.  She was hospitalized for about 2 months.  Also diagnosed with PE placed on Xarelto.  Coagulation work-up unrevealing.  Had a high-resolution chest CT 09/21/2020 showing aortic atherosclerosis and three-vessel coronary artery calcification.  Past Medical History:  Diagnosis Date   Anxiety    Chronic kidney disease    COVID-19 12/2020   Patient was hospitalized   Depression    Hyperlipidemia    Hypertension    Pneumothorax    Left lung   Pulmonary embolism (HCC)    Thyroid disease     Past  Surgical History:  Procedure Laterality Date   CARPAL TUNNEL RELEASE     CHOLECYSTECTOMY     TONSILLECTOMY  1970   TUBAL LIGATION      Current Medications: Current Meds  Medication Sig   albuterol (VENTOLIN HFA) 108 (90 Base) MCG/ACT inhaler Inhale into the lungs.   Ascorbic Acid (VITAMIN C) 1000 MG tablet Take 1,000 mg by mouth daily.   atorvastatin (LIPITOR) 80 MG tablet Take 1 tablet (80 mg total) by mouth daily.   buPROPion (WELLBUTRIN XL) 300 MG 24 hr tablet Take 1 tablet (300 mg total) by mouth daily.   Cholecalciferol (VITAMIN D3 ULTRA STRENGTH PO) Take 5,000 Int'l Units/day by mouth.   clopidogrel (PLAVIX) 75 MG tablet Take 1 tablet (75 mg total) by mouth daily.   ezetimibe (ZETIA) 10 MG tablet Take 1 tablet (10 mg total) by mouth daily.   halobetasol (ULTRAVATE) 0.05 % ointment Apply topically 2 (two) times daily.   hydroxychloroquine (PLAQUENIL) 200 MG tablet Take 2 tablets (400 mg total) by mouth daily.   hydrOXYzine (ATARAX/VISTARIL) 25 MG tablet Take by mouth.   levothyroxine (SYNTHROID) 112 MCG tablet TAKE 1 TABLET BY MOUTH EVERY DAY   magnesium oxide (MAG-OX) 400 MG tablet Take by mouth.   metoprolol tartrate (LOPRESSOR) 25 MG tablet Take 1 tablet (25 mg total) by mouth 2 (two) times daily.   Multiple Vitamins-Minerals (WOMENS MULTI) CAPS Take 1 capsule by mouth daily.   omeprazole (PRILOSEC) 40 MG capsule Take 1 capsule (  40 mg total) by mouth daily.   sertraline (ZOLOFT) 25 MG tablet Take 1 tablet (25 mg total) by mouth daily.   Tiotropium Bromide-Olodaterol (STIOLTO RESPIMAT) 2.5-2.5 MCG/ACT AERS Inhale 2 puffs into the lungs daily.   zinc gluconate 50 MG tablet Take 50 mg by mouth daily.     Allergies:   Patient has no known allergies.   Social History   Socioeconomic History   Marital status: Married    Spouse name: Not on file   Number of children: Not on file   Years of education: Not on file   Highest education level: Not on file  Occupational History    Not on file  Tobacco Use   Smoking status: Former    Packs/day: 1.00    Years: 15.00    Pack years: 15.00    Types: Cigarettes    Quit date: 12/26/2000    Years since quitting: 21.0   Smokeless tobacco: Never  Vaping Use   Vaping Use: Never used  Substance and Sexual Activity   Alcohol use: Yes    Comment: very rarely   Drug use: Not Currently   Sexual activity: Not Currently  Other Topics Concern   Not on file  Social History Narrative   Not on file   Social Determinants of Health   Financial Resource Strain: Not on file  Food Insecurity: Not on file  Transportation Needs: Not on file  Physical Activity: Not on file  Stress: Not on file  Social Connections: Not on file     Family History: The patient's family history includes Breast cancer in her sister; Breast cancer (age of onset: 35) in her mother; Colon cancer in her mother; Heart attack in her father; Heart disease in her father; High Cholesterol in her father; Lupus in her sister; Rheum arthritis in her sister; Thyroid disease in her daughter, sister, sister, and sister.  ROS:   Please see the history of present illness.     All other systems reviewed and are negative.  EKGs/Labs/Other Studies Reviewed:    The following studies were reviewed today:   EKG:  EKG not  ordered today.    Recent Labs: 08/11/2021: TSH 6.590 08/19/2021: ALT 36; BUN 19; Creatinine, Ser 1.27; Hemoglobin 14.6; Platelets 214; Potassium 4.5; Sodium 135  Recent Lipid Panel    Component Value Date/Time   CHOL 185 12/30/2021 1154   TRIG 226 (H) 12/30/2021 1154   HDL 47 12/30/2021 1154   CHOLHDL 3.9 12/30/2021 1154   LDLCALC 100 (H) 12/30/2021 1154     Risk Assessment/Calculations:          Physical Exam:    VS:  BP 130/70 (BP Location: Left Arm, Patient Position: Sitting, Cuff Size: Large)    Pulse 70    Ht 5\' 4"  (1.626 m)    Wt 259 lb 2 oz (117.5 kg)    SpO2 98%    BMI 44.48 kg/m     Wt Readings from Last 3 Encounters:   01/04/22 259 lb 2 oz (117.5 kg)  01/03/22 258 lb 9.6 oz (117.3 kg)  12/24/21 262 lb (118.8 kg)     GEN:  Well nourished, well developed in no acute distress HEENT: Normal NECK: No JVD; No carotid bruits CARDIAC: RRR, no murmurs, rubs, gallops RESPIRATORY: Poor inspiratory effort, diminished breath sounds especially at right lung base ABDOMEN: Soft, non-tender, non-distended MUSCULOSKELETAL:  No edema; No deformity  SKIN: Warm and dry NEUROLOGIC:  Alert and oriented x 3 PSYCHIATRIC:  Normal affect   ASSESSMENT:    1. Dyspnea on exertion   2. Coronary artery disease involving native coronary artery of native heart, unspecified whether angina present   3. Mixed hyperlipidemia   4. Primary hypertension     PLAN:    In order of problems listed above:  Dyspnea on exertion, coronary artery calcification.  Echo with preserved EF 60%, impaired relaxation.  Lexiscan Myoview with no evidence for ischemia.  Unsure if interstitial lung disease is contributing due to findings on recent high-resolution chest CT. obesity also contributing to shortness of breath.  Keep appointment with pulmonary medicine. CAD, three-vessel coronary artery calcification, Lexiscan Myoview with no ischemia.  Continue Plavix 75 mg daily, Lipitor 80 mg.  Hyperlipidemia, cholesterol levels improving.  Continue Lipitor 80 mg, Zetia 10 mg daily.  Lipid panel in 5 months. Hypertension, BP controlled.  Continue Lopressor.   Follow-up in 6 months.      Medication Adjustments/Labs and Tests Ordered: Current medicines are reviewed at length with the patient today.  Concerns regarding medicines are outlined above.  Orders Placed This Encounter  Procedures   Lipid panel    No orders of the defined types were placed in this encounter.    Patient Instructions  Medication Instructions:  Your physician recommends that you continue on your current medications as directed. Please refer to the Current Medication list  given to you today.  *If you need a refill on your cardiac medications before your next appointment, please call your pharmacy*   Lab Work: Your physician recommends that you return for a FASTING lipid profile: in 5 months    If you have labs (blood work) drawn today and your tests are completely normal, you will receive your results only by: MyChart Message (if you have MyChart) OR A paper copy in the mail If you have any lab test that is abnormal or we need to change your treatment, we will call you to review the results.   Testing/Procedures: None ordered   Follow-Up: At [facility], you and your health needs are our priority.  As part of our continuing mission to provide you with exceptional heart care, we have created designated Provider Care Teams.  These Care Teams include your primary Cardiologist (physician) and Advanced Practice Providers (APPs -  Physician Assistants and Nurse Practitioners) who all work together to provide you with the care you need, when you need it.  We recommend signing up for the patient portal called "MyChart".  Sign up information is provided on this After Visit Summary.  MyChart is used to connect with patients for Virtual Visits (Telemedicine).  Patients are able to view lab/test results, encounter notes, upcoming appointments, etc.  Non-urgent messages can be sent to your provider as well.   To learn more about what you can do with MyChart, go to [email or web address].    Your next appointment:   Your physician wants you to follow-up in: 6 months You will receive a reminder letter in the mail two months in advance. If you don't receive a letter, please call our office to schedule the follow-up appointment.   The format for your next appointment:   In Person  Provider:   You may see Debbe Odea, MD or one of the following Advanced Practice Providers on your designated Care Team:   Nicolasa Ducking, NP Eula Listen, PA-C Cadence Fransico Michael,  [place]    Other Instructions N/A    Signed, Debbe Odea, MD  01/04/2022 10:33 AM  [facility]

## 2022-01-04 NOTE — Patient Instructions (Signed)
Medication Instructions:  Your physician recommends that you continue on your current medications as directed. Please refer to the Current Medication list given to you today.  *If you need a refill on your cardiac medications before your next appointment, please call your pharmacy*   Lab Work: Your physician recommends that you return for a FASTING lipid profile: in 5 months    If you have labs (blood work) drawn today and your tests are completely normal, you will receive your results only by: [place] (if you have MyChart) OR A paper copy in the mail If you have any lab test that is abnormal or we need to change your treatment, we will call you to review the results.   Testing/Procedures: None ordered   Follow-Up: At [facility], you and your health needs are our priority.  As part of our continuing mission to provide you with exceptional heart care, we have created designated Provider Care Teams.  These Care Teams include your primary Cardiologist (physician) and Advanced Practice Providers (APPs -  Physician Assistants and Nurse Practitioners) who all work together to provide you with the care you need, when you need it.  We recommend signing up for the patient portal called "MyChart".  Sign up information is provided on this After Visit Summary.  MyChart is used to connect with patients for Virtual Visits (Telemedicine).  Patients are able to view lab/test results, encounter notes, upcoming appointments, etc.  Non-urgent messages can be sent to your provider as well.   To learn more about what you can do with MyChart, go to [email or web address].    Your next appointment:   Your physician wants you to follow-up in: 6 months You will receive a reminder letter in the mail two months in advance. If you don't receive a letter, please call our office to schedule the follow-up appointment.   The format for your next appointment:   In Person  Provider:   You may see Kate Sable, MD or one of the following Advanced Practice Providers on your designated Care Team:   Murray Hodgkins, NP Christell Faith, PA-C Cadence Kathlen Mody, PA-C{    Other Instructions N/A

## 2022-01-09 ENCOUNTER — Other Ambulatory Visit: Payer: Self-pay | Admitting: Internal Medicine

## 2022-01-09 NOTE — Telephone Encounter (Signed)
Requested Prescriptions  Pending Prescriptions Disp Refills   omeprazole (PRILOSEC) 40 MG capsule [Pharmacy Med Name: OMEPRAZOLE DR 40 MG CAPSULE] 90 capsule 1    Sig: TAKE 1 CAPSULE (40 MG TOTAL) BY MOUTH DAILY.     Gastroenterology: Proton Pump Inhibitors Passed - 01/09/2022  9:28 AM      Passed - Valid encounter within last 12 months    Recent Outpatient Visits          6 days ago Hyperlipidemia, unspecified hyperlipidemia type   [facility] Vigg, Avanti, MD   2 weeks ago Acute cough   [facility] McElwee, Lauren A, NP   2 months ago Fatty liver   [facility] Vigg, Avanti, MD   3 months ago Essential (primary) hypertension   [facility] Vigg, Avanti, MD   4 months ago Thyroid disease   [facility] McElwee, Lauren A, NP      Future Appointments            In 1 week  [facility] [place]   In 1 month Vigg, Avanti, MD [facility]   In 3 months Rice, Jamesetta Orleans, MD [facility]

## 2022-01-10 DIAGNOSIS — J0141 Acute recurrent pansinusitis: Secondary | ICD-10-CM | POA: Diagnosis not present

## 2022-01-17 ENCOUNTER — Ambulatory Visit: Payer: Disability Insurance

## 2022-01-29 ENCOUNTER — Other Ambulatory Visit: Payer: Self-pay | Admitting: Nurse Practitioner

## 2022-01-31 ENCOUNTER — Ambulatory Visit (INDEPENDENT_AMBULATORY_CARE_PROVIDER_SITE_OTHER): Payer: 59 | Admitting: Gastroenterology

## 2022-01-31 ENCOUNTER — Other Ambulatory Visit: Payer: Self-pay

## 2022-01-31 DIAGNOSIS — Z23 Encounter for immunization: Secondary | ICD-10-CM

## 2022-01-31 NOTE — Progress Notes (Signed)
Pt was seen today for nurse visit to get 2 of 3 Twinrix given in right deltoid, pt tolerated inj well  Appt scheduled for 3 of 3 injection

## 2022-02-02 IMAGING — US US RENAL
1 series · 14 of 25 positions shown · non-contrast
Comparison: None.

CLINICAL DATA: Acute kidney injury

EXAM:
RENAL / URINARY TRACT ULTRASOUND COMPLETE

[Series 1: us renal · 0.28mm/px · 14 of 35 slices shown]
[im 1/35]
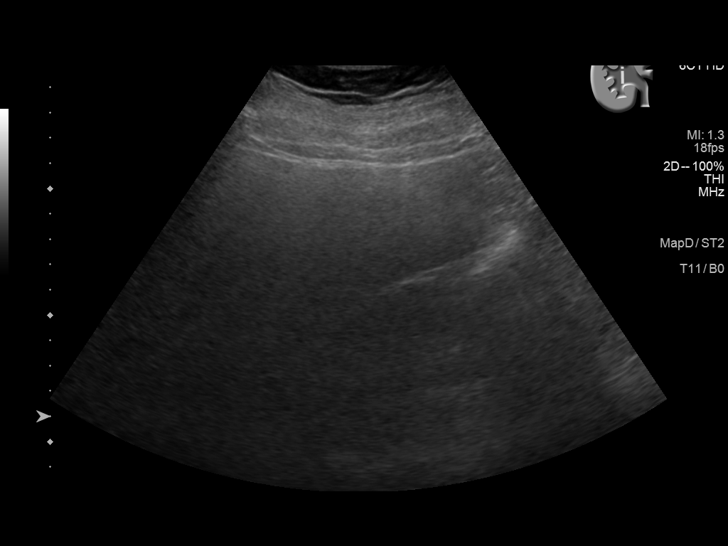
[im 3/35]
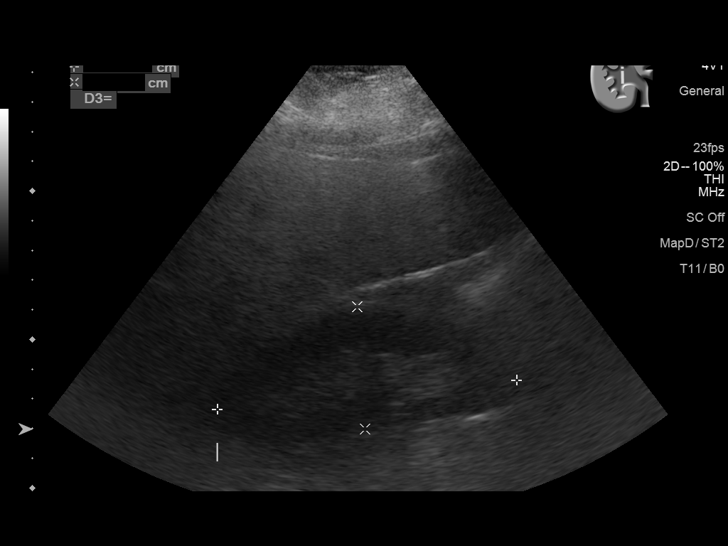
[im 6/35]
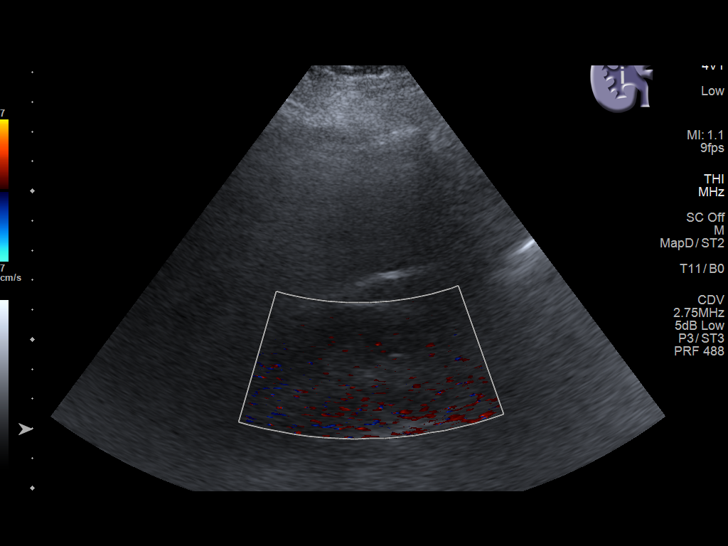
[im 9/35]
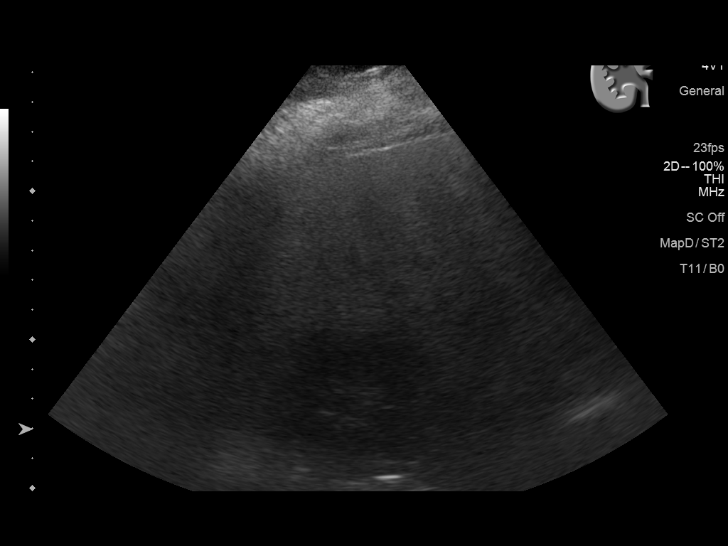
[im 12/35]
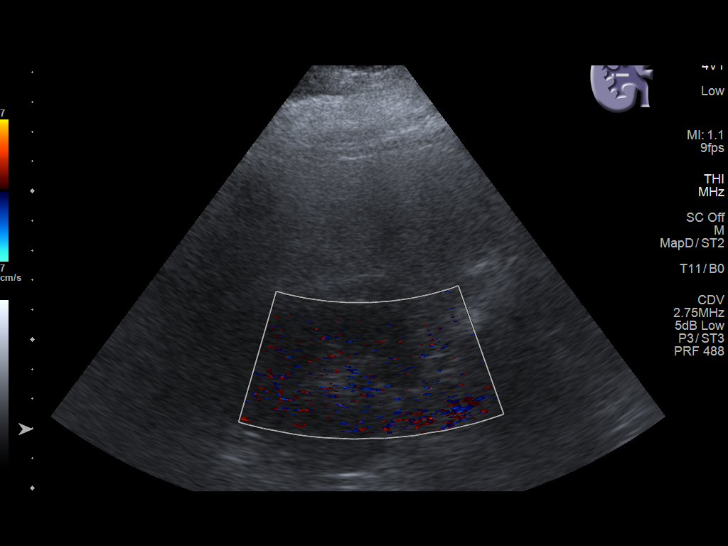
[im 13/35]
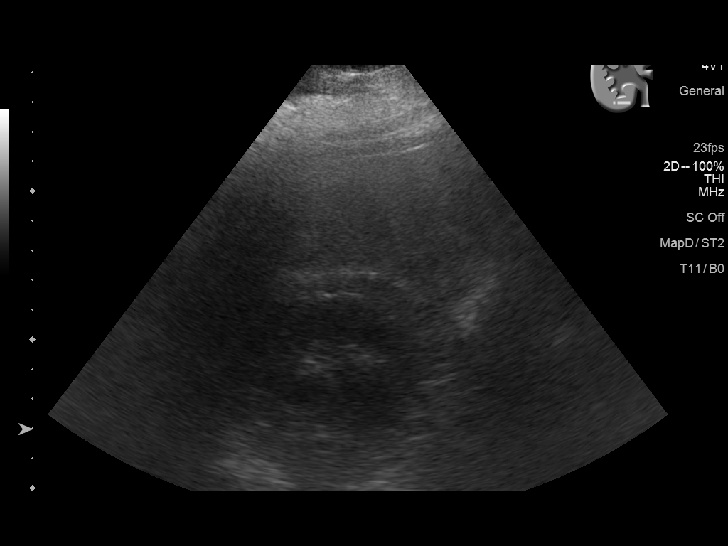
[im 16/35]
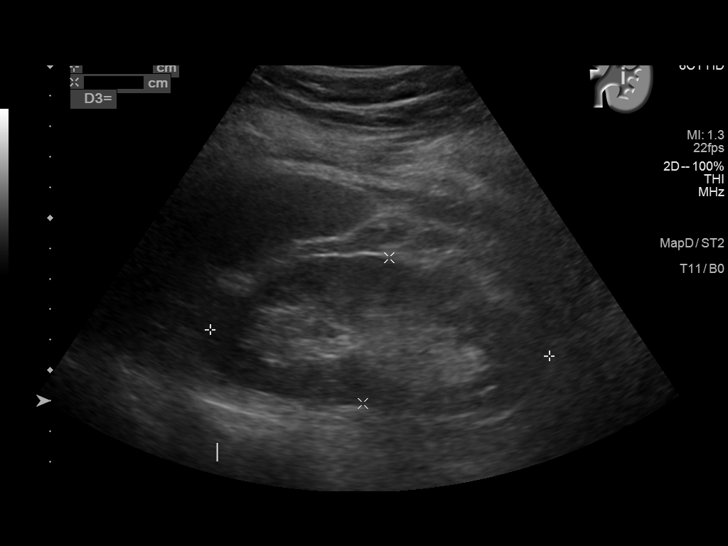
[im 19/35]
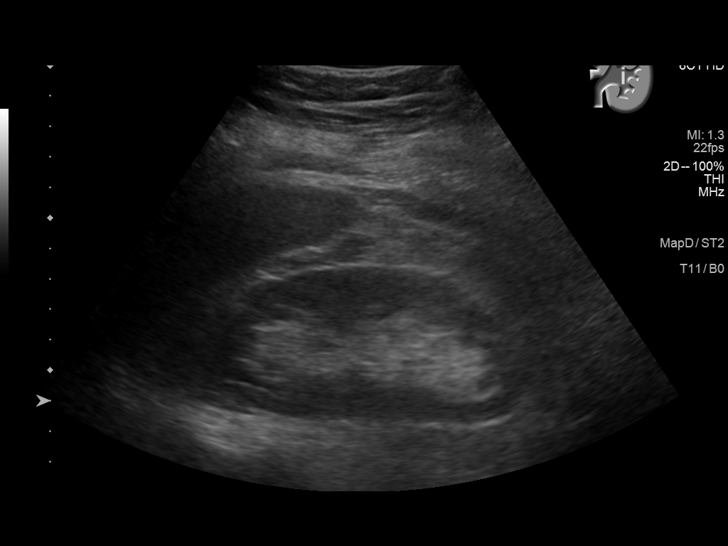
[im 22/35]
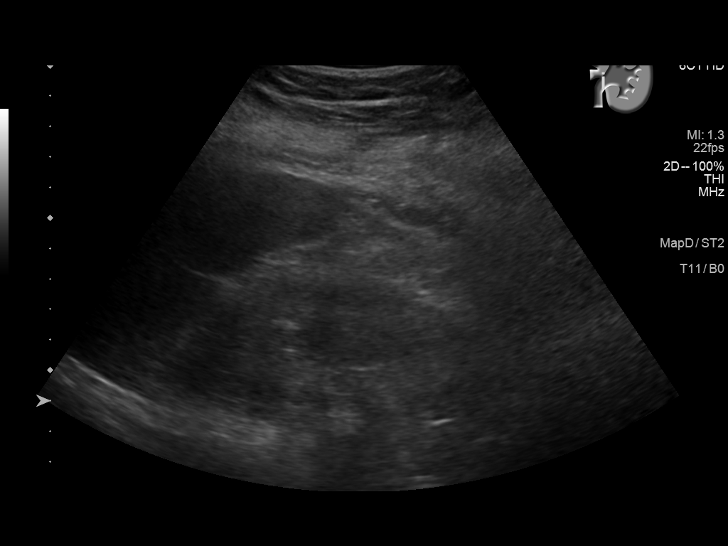
[im 23/35]
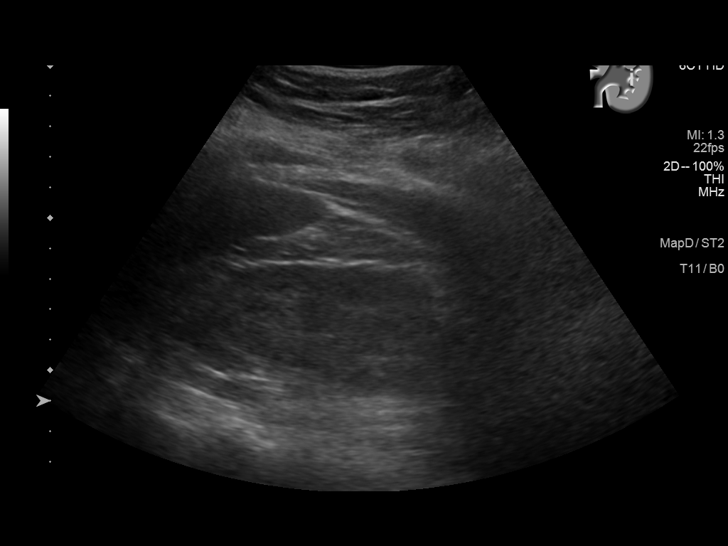
[im 26/35]
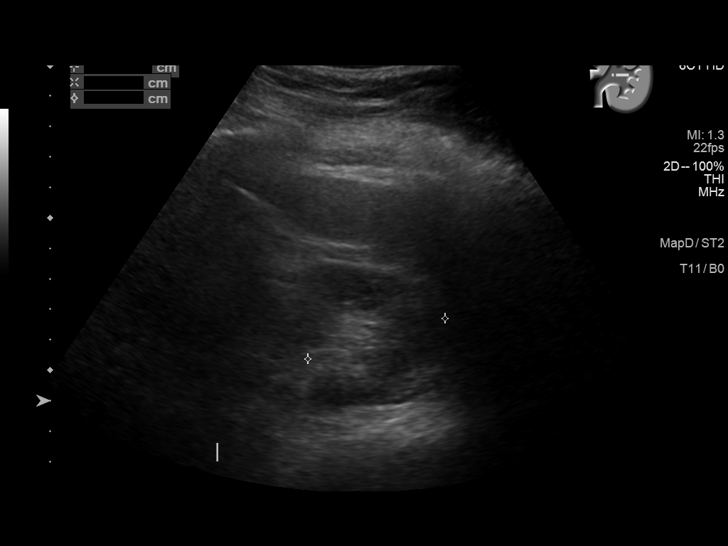
[im 29/35]
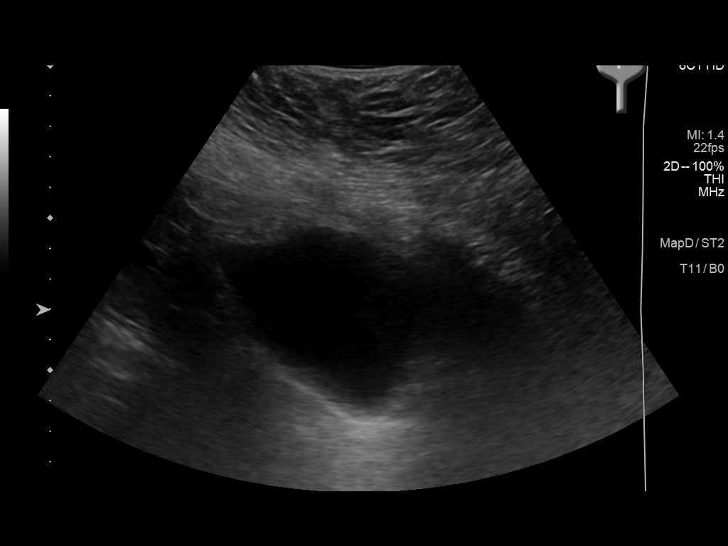
[im 32/35]
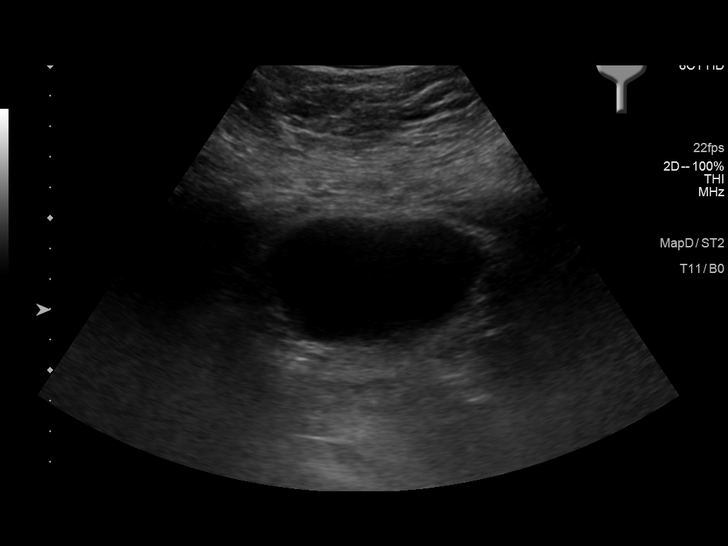
[im 35/35]
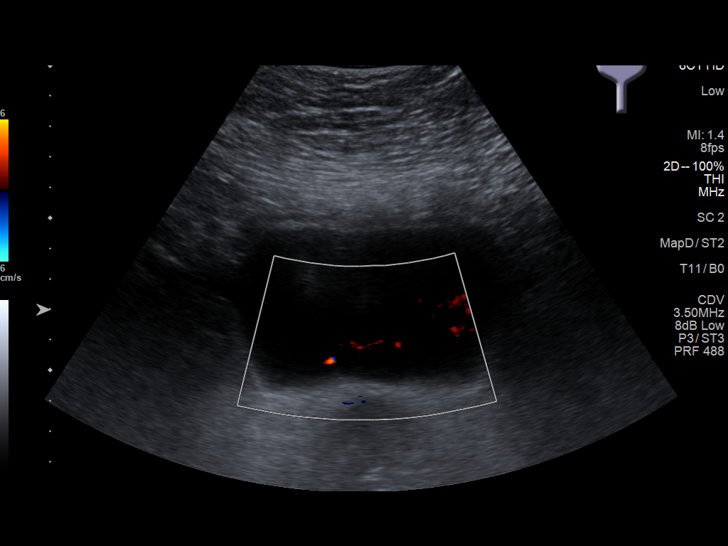

[14 of 25 positions shown; findings below may reference images not displayed]

FINDINGS: Right Kidney:

Renal measurements: 10.1 x 4.1 x 5.2 cm = volume: 114.6 mL.
Echogenicity within normal limits. No mass or hydronephrosis
visualized.

Left Kidney:

Renal measurements: 11.2 x 4.9 x 4.7 cm = volume: 133.5 mL.
Echogenicity within normal limits. No mass or hydronephrosis
visualized.

Bladder:

Appears normal for degree of bladder distention.

Other:

Incidental note made of echogenic liver consistent with steatosis
IMPRESSION: 1. Normal ultrasound appearance of the kidneys.
2. Hepatic steatosis

## 2022-02-03 DIAGNOSIS — N1831 Chronic kidney disease, stage 3a: Secondary | ICD-10-CM | POA: Diagnosis not present

## 2022-02-03 DIAGNOSIS — E039 Hypothyroidism, unspecified: Secondary | ICD-10-CM | POA: Diagnosis not present

## 2022-02-06 IMAGING — CT CT CHEST HIGH RESOLUTION W/O CM
1 of 3 series · 13 of 32 positions shown, 17 images · non-contrast
Comparison: No priors.

CLINICAL DATA: 55-year-old female with history of shortness of
breath. Midsternal chest pain.

EXAM:
CT CHEST WITHOUT CONTRAST
TECHNIQUE: Multidetector CT imaging of the chest was performed following the
standard protocol without intravenous contrast. High resolution
imaging of the lungs, as well as inspiratory and expiratory imaging,
was performed.

[Series 2: thorax · axial · 0.62mm/px · z∈[-794,-502]mm · 13 of 160 slices shown, 17 images]
[im 7/160  mediastinal]
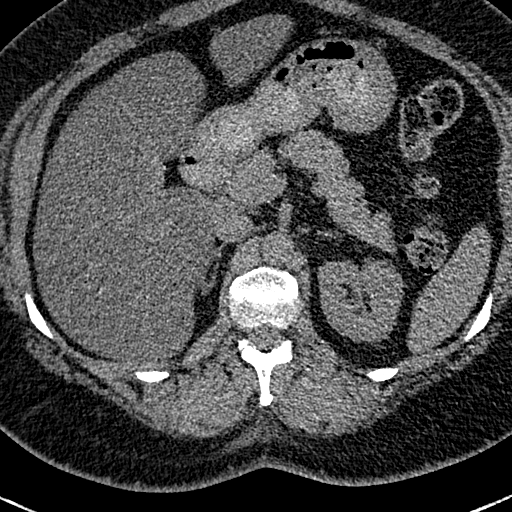
[im 7/160  lung]
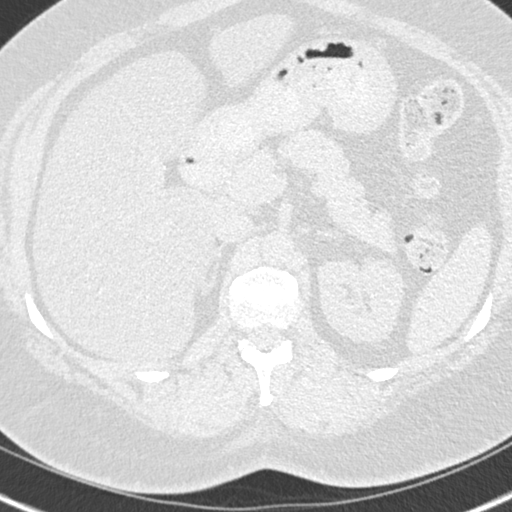
[im 21/160  lung]
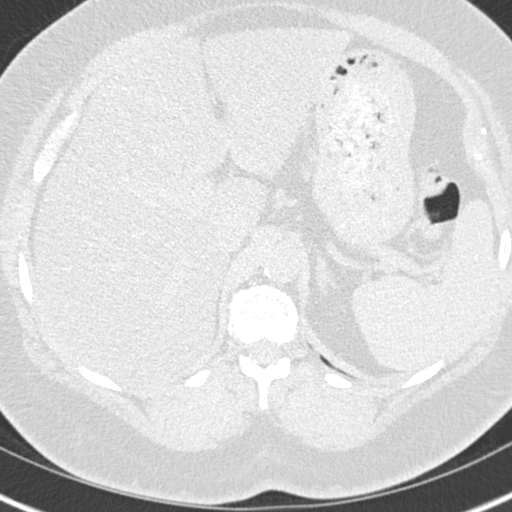
[im 35/160  lung]
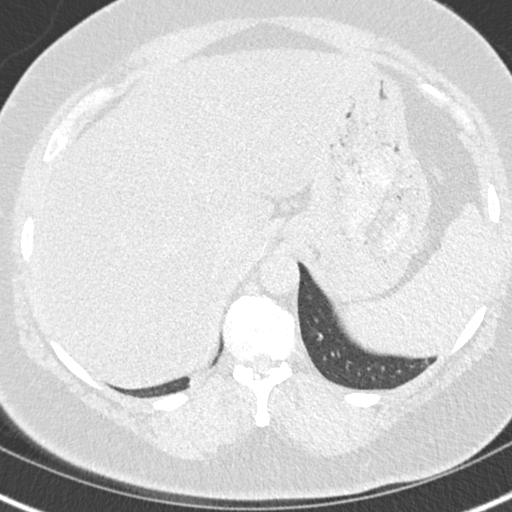
[im 49/160  lung]
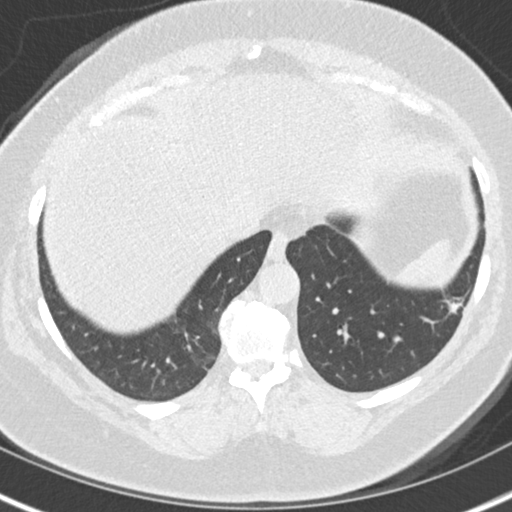
[im 56/160  mediastinal]
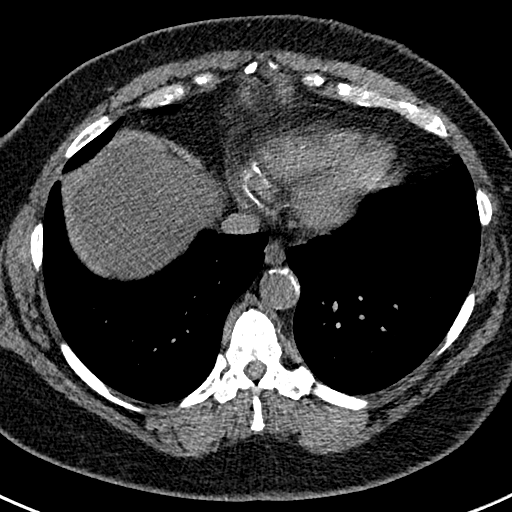
[im 56/160  lung]
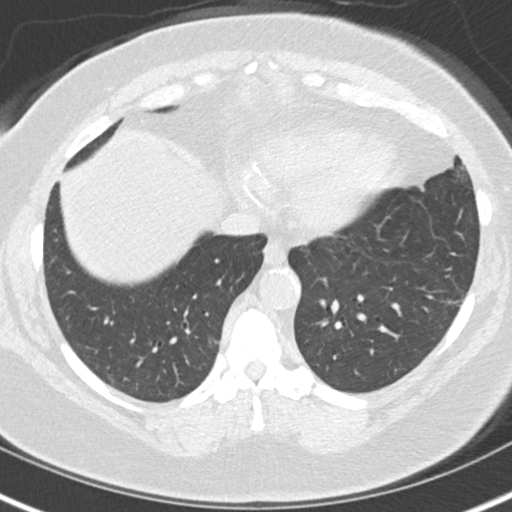
[im 70/160  lung]
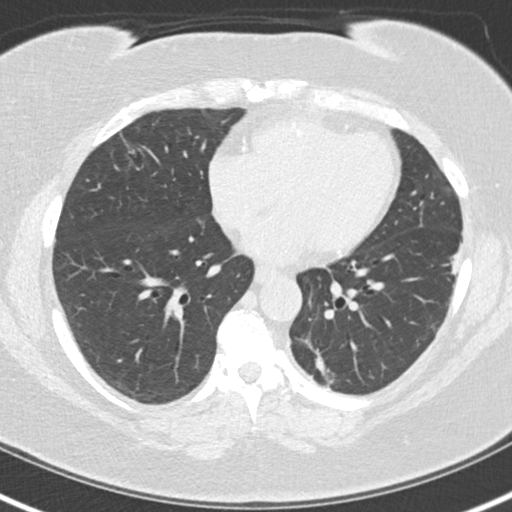
[im 77/160  lung]
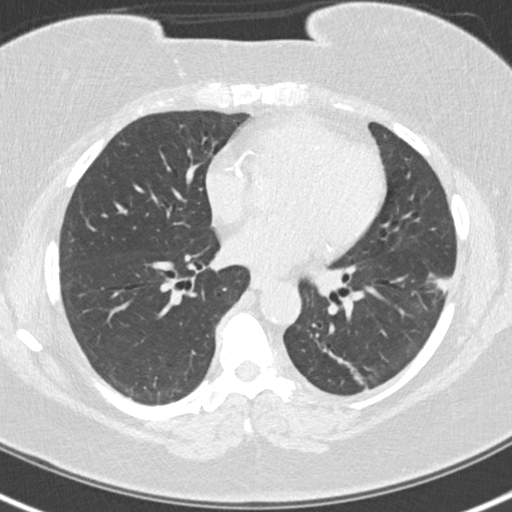
[im 90/160  lung]
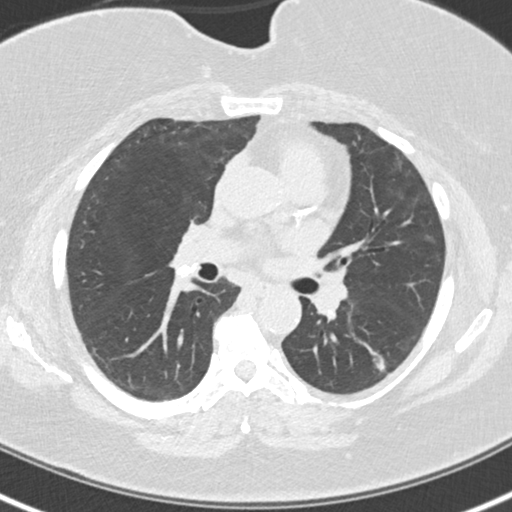
[im 104/160  mediastinal]
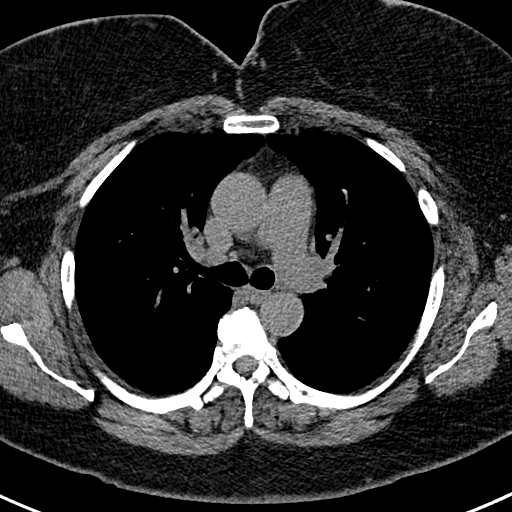
[im 104/160  lung]
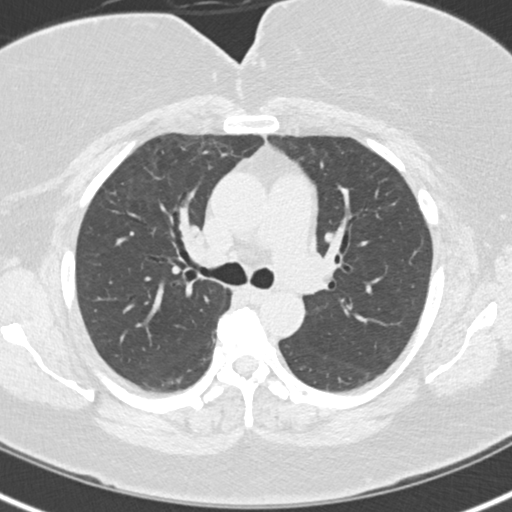
[im 111/160  lung]
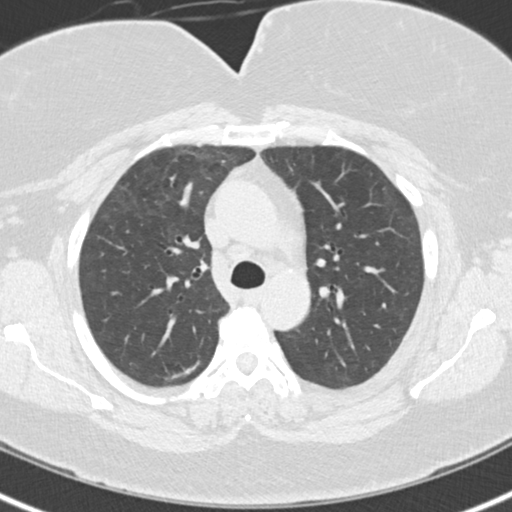
[im 125/160  lung]
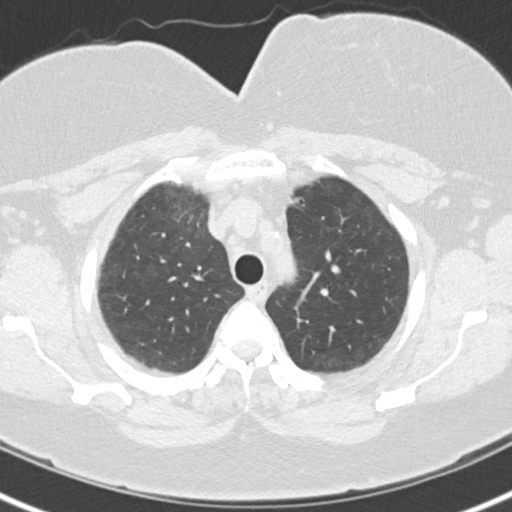
[im 139/160  lung]
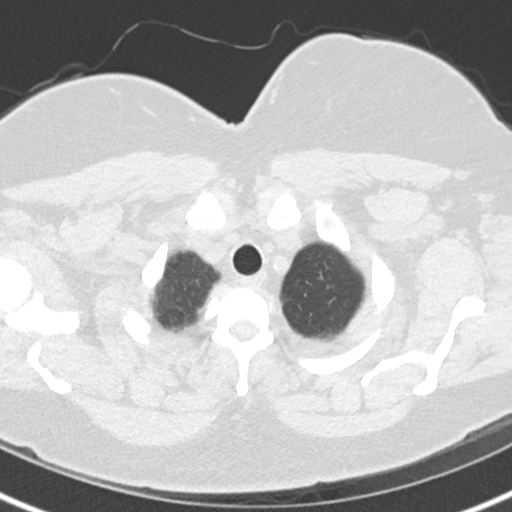
[im 153/160  mediastinal]
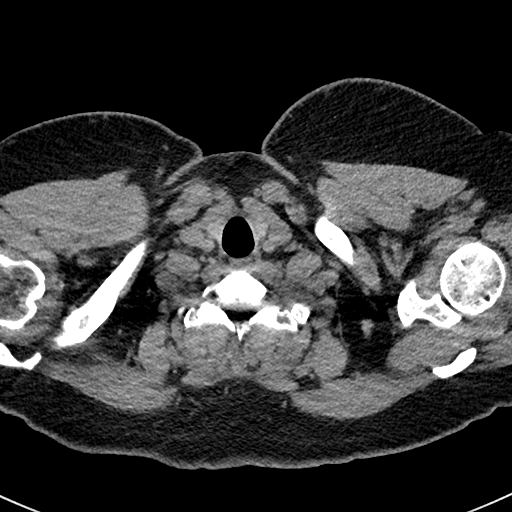
[im 153/160  lung]
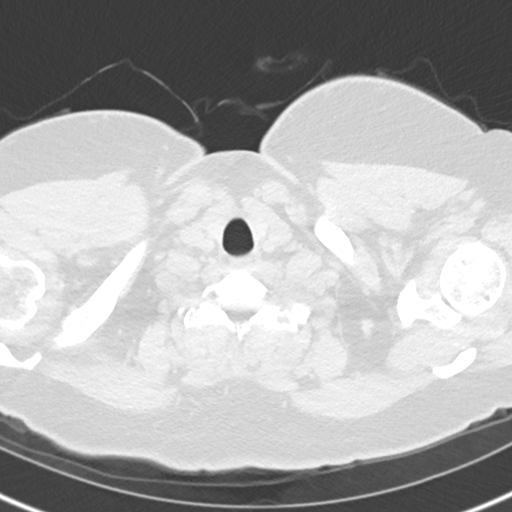

[13 of 32 positions shown; findings below may reference images not displayed]

FINDINGS: Cardiovascular: Heart size is normal. There is no significant
pericardial fluid, thickening or pericardial calcification. There is
aortic atherosclerosis, as well as atherosclerosis of the great
vessels of the mediastinum and the coronary arteries, including
calcified atherosclerotic plaque in the left main, left anterior
descending, left circumflex and right coronary arteries.

Mediastinum/Nodes: No pathologically enlarged mediastinal or hilar
lymph nodes. Please note that accurate exclusion of hilar adenopathy
is limited on noncontrast CT scans. Several densely calcified
bilateral hilar lymph nodes are incidentally noted. Esophagus is
unremarkable in appearance. No axillary lymphadenopathy.

Lungs/Pleura: High-resolution images demonstrates some patchy areas
of very mild ground-glass attenuation, septal thickening, scattered
areas of cylindrical bronchiectasis with some regional peripheral
predominant nodular architectural distortion, favored to reflect
areas of chronic post infectious or inflammatory scarring. No frank
honeycombing. These findings have no definitive craniocaudal
gradient, although there is a slight prevalent in the bases.
Inspiratory and expiratory imaging demonstrates some mild air
trapping indicative of mild small airways disease. No acute
consolidative airspace disease. No pleural effusions.

Upper Abdomen: Severe diffuse low attenuation throughout the
visualized hepatic parenchyma, indicative of severe hepatic
steatosis. Aortic atherosclerosis.

Musculoskeletal: There are no aggressive appearing lytic or blastic
lesions noted in the visualized portions of the skeleton.
IMPRESSION: 1. Unusual findings in the lungs which could be indicative of early
or mild interstitial lung disease, with a spectrum of findings at
this time categorized as most suggestive of an alternative diagnosis
(not usual interstitial pneumonia) per current ATS guidelines.
Repeat high-resolution chest CT is recommended in 6 months to assess
for temporal changes in the appearance of the lung parenchyma.
2. Multiple nodular areas of architectural distortion, strongly
favored to represent areas of post infectious or inflammatory
scarring given their associated areas of bronchiectasis. Close
attention at time of repeat high-resolution chest CT is recommended
to ensure stability.
3. Aortic atherosclerosis, in addition to left main and 3 vessel
coronary artery disease. Please note that although the presence of
coronary artery calcium documents the presence of coronary artery
disease, the severity of this disease and any potential stenosis
cannot be assessed on this non-gated CT examination. Assessment for
potential risk factor modification, dietary therapy or pharmacologic
therapy may be warranted, if clinically indicated.
4. Severe hepatic steatosis.

Aortic Atherosclerosis ([email or web address]-[email or web address]).

## 2022-02-08 ENCOUNTER — Other Ambulatory Visit: Payer: Self-pay | Admitting: Internal Medicine

## 2022-02-08 NOTE — Telephone Encounter (Signed)
Refilled 12/31/2021 #30 4 refills - a 5 month supply  - should last until 05/26/2022. Requested Prescriptions  Pending Prescriptions Disp Refills   buPROPion (WELLBUTRIN XL) 300 MG 24 hr tablet [Pharmacy Med Name: BUPROPION HCL XL 300 MG TABLET] 90 tablet 2    Sig: TAKE 1 TABLET BY MOUTH EVERY DAY     Psychiatry: Antidepressants - bupropion Failed - 02/08/2022  8:33 AM      Failed - Cr in normal range and within 360 days    Creatinine, Ser  Date Value Ref Range Status  08/19/2021 1.27 (H) 0.44 - 1.00 mg/dL Final         Passed - AST in normal range and within 360 days    AST  Date Value Ref Range Status  08/19/2021 33 15 - 41 U/L Final         Passed - ALT in normal range and within 360 days    ALT  Date Value Ref Range Status  08/19/2021 36 0 - 44 U/L Final         Passed - Completed PHQ-2 or PHQ-9 in the last 360 days      Passed - Last BP in normal range    BP Readings from Last 1 Encounters:  01/04/22 130/70         Passed - Valid encounter within last 6 months    Recent Outpatient Visits          1 month ago Hyperlipidemia, unspecified hyperlipidemia type   [facility] Vigg, Avanti, MD   1 month ago Acute cough   [facility] McElwee, Lauren A, NP   3 months ago Fatty liver   [facility] Vigg, Avanti, MD   4 months ago Essential (primary) hypertension   [facility] Vigg, Avanti, MD   5 months ago Thyroid disease   [facility] McElwee, Jake Church, NP      Future Appointments            In 2 weeks Vigg, Avanti, MD [facility]   In 2 months Rice, Jamesetta Orleans, MD [facility]   In 3 months  [facility] [place]

## 2022-02-09 ENCOUNTER — Other Ambulatory Visit: Payer: Self-pay | Admitting: *Deleted

## 2022-02-09 DIAGNOSIS — Z86711 Personal history of pulmonary embolism: Secondary | ICD-10-CM

## 2022-02-12 ENCOUNTER — Encounter: Payer: Self-pay | Admitting: Oncology

## 2022-02-14 DIAGNOSIS — R490 Dysphonia: Secondary | ICD-10-CM | POA: Diagnosis not present

## 2022-02-14 DIAGNOSIS — K219 Gastro-esophageal reflux disease without esophagitis: Secondary | ICD-10-CM | POA: Diagnosis not present

## 2022-02-17 ENCOUNTER — Inpatient Hospital Stay: Payer: 59 | Attending: Oncology

## 2022-02-17 ENCOUNTER — Inpatient Hospital Stay (HOSPITAL_BASED_OUTPATIENT_CLINIC_OR_DEPARTMENT_OTHER): Payer: 59 | Admitting: Oncology

## 2022-02-17 ENCOUNTER — Encounter: Payer: Self-pay | Admitting: Oncology

## 2022-02-17 ENCOUNTER — Other Ambulatory Visit: Payer: Self-pay

## 2022-02-17 VITALS — BP 117/76 | HR 64 | Temp 96.4°F | Resp 18 | Wt 256.6 lb

## 2022-02-17 DIAGNOSIS — Z1231 Encounter for screening mammogram for malignant neoplasm of breast: Secondary | ICD-10-CM

## 2022-02-17 DIAGNOSIS — Z86711 Personal history of pulmonary embolism: Secondary | ICD-10-CM

## 2022-02-17 DIAGNOSIS — Z87891 Personal history of nicotine dependence: Secondary | ICD-10-CM | POA: Diagnosis not present

## 2022-02-17 DIAGNOSIS — Z79899 Other long term (current) drug therapy: Secondary | ICD-10-CM | POA: Insufficient documentation

## 2022-02-17 LAB — COMPREHENSIVE METABOLIC PANEL
ALT: 26 U/L (ref 0–44)
AST: 19 U/L (ref 15–41)
Albumin: 3.9 g/dL (ref 3.5–5.0)
Alkaline Phosphatase: 78 U/L (ref 38–126)
Anion gap: 5 (ref 5–15)
BUN: 16 mg/dL (ref 6–20)
CO2: 27 mmol/L (ref 22–32)
Calcium: 9.1 mg/dL (ref 8.9–10.3)
Chloride: 104 mmol/L (ref 98–111)
Creatinine, Ser: 1.29 mg/dL — ABNORMAL HIGH (ref 0.44–1.00)
GFR, Estimated: 48 mL/min — ABNORMAL LOW (ref >60)
Glucose, Bld: 104 mg/dL — ABNORMAL HIGH (ref 70–99)
Potassium: 4.3 mmol/L (ref 3.5–5.1)
Sodium: 136 mmol/L (ref 135–145)
Total Bilirubin: 0.3 mg/dL (ref 0.3–1.2)
Total Protein: 7.4 g/dL (ref 6.5–8.1)

## 2022-02-17 LAB — CBC WITH DIFFERENTIAL/PLATELET
Abs Immature Granulocytes: 0.01 10*3/uL (ref 0.00–0.07)
Basophils Absolute: 0.1 10*3/uL (ref 0.0–0.1)
Basophils Relative: 1 %
Eosinophils Absolute: 0.2 10*3/uL (ref 0.0–0.5)
Eosinophils Relative: 4 %
HCT: 42.9 % (ref 36.0–46.0)
Hemoglobin: 13.9 g/dL (ref 12.0–15.0)
Immature Granulocytes: 0 %
Lymphocytes Relative: 40 %
Lymphs Abs: 2.3 10*3/uL (ref 0.7–4.0)
MCH: 27.9 pg (ref 26.0–34.0)
MCHC: 32.4 g/dL (ref 30.0–36.0)
MCV: 86.1 fL (ref 80.0–100.0)
Monocytes Absolute: 0.4 10*3/uL (ref 0.1–1.0)
Monocytes Relative: 8 %
Neutro Abs: 2.8 10*3/uL (ref 1.7–7.7)
Neutrophils Relative %: 47 %
Platelets: 172 10*3/uL (ref 150–400)
RBC: 4.98 MIL/uL (ref 3.87–5.11)
RDW: 13.9 % (ref 11.5–15.5)
WBC: 5.8 10*3/uL (ref 4.0–10.5)
nRBC: 0 % (ref 0.0–0.2)

## 2022-02-17 NOTE — Progress Notes (Signed)
Pt here for follow up. No new concerns voiced.   

## 2022-02-17 NOTE — Progress Notes (Signed)
Hematology/Oncology Progress note [phone] [phone]      Clinic Day:  02/17/2022  Referring physician: Charlynne Cousins, MD  Chief Complaint: Bianca Michael is a 56 y.o. female presents for history of pulmonary emboli s/p COVID   PERTINENT ONCOLOGY HISTORY  Patient previously followed up by Dr.Corcoran, patient switched care to me on 07/08/21 Extensive medical record review was performed by me  Chronic history of morbid obesity and Hashimoto's thyroiditis.  01/21/2021 [facility] hospitalization due to respiratory distress secondary to COVID-19 pneumonia. Patient was intubated around 01/24/2021. tocilizumab on 01/22/2021. Transferred to [facility] from 01/28/2021 - 03/18/2021. She stayed on a ventilator until the beginning of March.  01/26/2021 CT angio pulmonary showed acute left lower lobe pulmonary embolus and a new 2.5 cm right ventricular luminal thrombus.  No CT evidence of elevated right heart pressure.  Worsening severe diffuse groundglass opacity consistent with COVID-19 pneumonia.  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 She required PRBC transfusions. She was transferred out of the ICU on 02/26/2021. Heparin was transitioned to Eliquis. She continued to improve and was weaned off of oxygen. PEG was removed on the day of discharge. She was referred to outpatient PT and OT. She was discharged on Eliquis.  04/07/2021, establish care with Dr. Mike Gip. Work-up on 04/07/2021 revealed a hematocrit of 38.2, hemoglobin 12.3, platelets 316,000, WBC 6,900 with an Mallard of 3700. CMP was normal. Lupus anticoagulant panel was negative.  Beta-2  glycoprotein IgG was 60 (0-20), IgM < 9, and IgA < 9. Cardiolipin antibodies were normal. Factor 5 Leiden and prothrombin gene mutation are negative Patient was recommended to switch to Xarelto due to insurance coverage.   Family history Her mother and sister had breast cancer. Her maternal aunt had ovarian cancer. Her three sisters all had thyroid issues. One of her sisters has lupus. Patient reports that she had genetic testing done and was negative.    INTERVAL HISTORY Bianca Michael is a 56 y.o. female who has above history reviewed by me today presents for follow up visit for history of pulmonary embolism.   Patient is off Xarelto.  She is currently on Plavix for coronary artery disease. Previously elevated beta-2 glycoprotein IgG was normal on repeat testing. Patient has establish care with pulmonology for interstitial lung disease. Today she reports her breathing status is at baseline.   Past Medical History:  Diagnosis Date   Anxiety    Chronic kidney disease    COVID-19 12/2020   Patient was hospitalized   Depression    Hyperlipidemia    Hypertension    Pneumothorax    Left lung   Pulmonary embolism ([place])    Thyroid disease     Past Surgical History:  Procedure Laterality Date   CARPAL TUNNEL RELEASE     CHOLECYSTECTOMY     TONSILLECTOMY  1970   TUBAL LIGATION      Family History  Problem Relation Age of Onset   Breast cancer Mother 9   Colon cancer Mother    Heart attack Father    Heart disease Father    High Cholesterol Father    Thyroid disease Sister    Breast cancer Sister    Thyroid disease Sister    Thyroid disease Sister  Lupus Sister    Rheum arthritis Sister    Thyroid disease Daughter     Social History:  reports that she quit smoking about 21 years ago. Her smoking use included cigarettes. She has a 15.00 pack-year smoking history. She has never used smokeless tobacco. She reports current alcohol use. She reports that she does not  currently use drugs.  Allergies: No Known Allergies  Current Medications: Current Outpatient Medications  Medication Sig Dispense Refill   albuterol (VENTOLIN HFA) 108 (90 Base) MCG/ACT inhaler Inhale into the lungs.     Ascorbic Acid (VITAMIN C) 1000 MG tablet Take 1,000 mg by mouth daily.     atorvastatin (LIPITOR) 80 MG tablet Take 1 tablet (80 mg total) by mouth daily. 90 tablet 3   buPROPion (WELLBUTRIN XL) 300 MG 24 hr tablet Take 1 tablet (300 mg total) by mouth daily. 30 tablet 4   Cholecalciferol (VITAMIN D3 ULTRA STRENGTH PO) Take 5,000 Int'l Units/day by mouth.     clopidogrel (PLAVIX) 75 MG tablet Take 1 tablet (75 mg total) by mouth daily. 90 tablet 3   halobetasol (ULTRAVATE) 0.05 % ointment Apply topically 2 (two) times daily.     hydroxychloroquine (PLAQUENIL) 200 MG tablet Take 2 tablets (400 mg total) by mouth daily. 60 tablet 5   hydrOXYzine (ATARAX/VISTARIL) 25 MG tablet Take by mouth.     levothyroxine (SYNTHROID) 112 MCG tablet Take 1 tablet (112 mcg total) by mouth daily. needs appointment and labs for further refills 30 tablet 0   magnesium oxide (MAG-OX) 400 MG tablet Take by mouth.     metoprolol tartrate (LOPRESSOR) 25 MG tablet Take 1 tablet (25 mg total) by mouth 2 (two) times daily. 90 tablet 1   Multiple Vitamins-Minerals (WOMENS MULTI) CAPS Take 1 capsule by mouth daily.     omeprazole (PRILOSEC) 40 MG capsule TAKE 1 CAPSULE (40 MG TOTAL) BY MOUTH DAILY. 90 capsule 1   sertraline (ZOLOFT) 25 MG tablet Take 1 tablet (25 mg total) by mouth daily. 90 tablet 1   Tiotropium Bromide-Olodaterol (STIOLTO RESPIMAT) 2.5-2.5 MCG/ACT AERS Inhale 2 puffs into the lungs daily. 4 g 0   zinc gluconate 50 MG tablet Take 50 mg by mouth daily.     No current facility-administered medications for this visit.    Review of Systems  Constitutional:  Negative for chills, diaphoresis, fever, malaise/fatigue and weight loss.  HENT:  Negative for congestion, ear discharge, ear pain,  hearing loss, nosebleeds, sinus pain, sore throat and tinnitus.        Sinus drainage. Runny nose.  Eyes:  Negative for blurred vision.  Respiratory:  Positive for shortness of breath (with talking and exertion). Negative for cough, hemoptysis and sputum production.   Cardiovascular:  Negative for chest pain, palpitations and leg swelling.  Gastrointestinal:  Negative for abdominal pain, blood in stool, constipation, diarrhea, heartburn, melena, nausea (in the mornings) and vomiting.  Genitourinary:  Negative for dysuria, frequency, hematuria and urgency.       Bladder pain  Musculoskeletal:  Positive for back pain, joint pain (elbows and shoulders, arthritis) and myalgias. Negative for neck pain.  Skin:  Negative for itching and rash.  Neurological:  Positive for sensory change (hand and buttock numbness) and headaches. Negative for dizziness, tingling and weakness.  Endo/Heme/Allergies:  Positive for environmental allergies. Does not bruise/bleed easily.  Psychiatric/Behavioral:  Negative for depression and memory loss. The patient is not nervous/anxious and does not have insomnia.   All other systems reviewed and  are negative. Performance status (ECOG): 1-2  Vitals Blood pressure 117/76, pulse 64, temperature (!) 96.4 F (35.8 C), resp. rate 18, weight 256 lb 9.6 oz (116.4 kg).   Physical Exam Vitals and nursing note reviewed.  Constitutional:      General: She is not in acute distress.    Appearance: She is obese. She is not diaphoretic.  HENT:     Head: Normocephalic and atraumatic.  Eyes:     General: No scleral icterus.    Pupils: Pupils are equal, round, and reactive to light.  Cardiovascular:     Rate and Rhythm: Normal rate.  Pulmonary:     Effort: Pulmonary effort is normal. No respiratory distress.  Abdominal:     General: There is no distension.  Musculoskeletal:        General: Normal range of motion.     Cervical back: Normal range of motion.     Comments: Trace  edema bilaterally.  Lymphadenopathy:     Head:     Right side of head: No preauricular, posterior auricular or occipital adenopathy.     Left side of head: No preauricular, posterior auricular or occipital adenopathy.     Upper Body:     Right upper body: No supraclavicular or axillary adenopathy.     Left upper body: No supraclavicular or axillary adenopathy.     Lower Body: No right inguinal adenopathy. No left inguinal adenopathy.  Skin:    Findings: No rash.     Comments: Well healed scarring from tracheostomy and PEG tube.    Neurological:     Mental Status: She is alert and oriented to person, place, and time.  Psychiatric:        Mood and Affect: Mood normal.    Laboratory data  Appointment on 02/17/2022  Component Date Value Ref Range Status   WBC 02/17/2022 5.8  4.0 - 10.5 K/uL Final   RBC 02/17/2022 4.98  3.87 - 5.11 MIL/uL Final   Hemoglobin 02/17/2022 13.9  12.0 - 15.0 g/dL Final   HCT 02/17/2022 42.9  36.0 - 46.0 % Final   MCV 02/17/2022 86.1  80.0 - 100.0 fL Final   MCH 02/17/2022 27.9  26.0 - 34.0 pg Final   MCHC 02/17/2022 32.4  30.0 - 36.0 g/dL Final   RDW 02/17/2022 13.9  11.5 - 15.5 % Final   Platelets 02/17/2022 172  150 - 400 K/uL Final   nRBC 02/17/2022 0.0  0.0 - 0.2 % Final   Neutrophils Relative % 02/17/2022 47  % Final   Neutro Abs 02/17/2022 2.8  1.7 - 7.7 K/uL Final   Lymphocytes Relative 02/17/2022 40  % Final   Lymphs Abs 02/17/2022 2.3  0.7 - 4.0 K/uL Final   Monocytes Relative 02/17/2022 8  % Final   Monocytes Absolute 02/17/2022 0.4  0.1 - 1.0 K/uL Final   Eosinophils Relative 02/17/2022 4  % Final   Eosinophils Absolute 02/17/2022 0.2  0.0 - 0.5 K/uL Final   Basophils Relative 02/17/2022 1  % Final   Basophils Absolute 02/17/2022 0.1  0.0 - 0.1 K/uL Final   Immature Granulocytes 02/17/2022 0  % Final   Abs Immature Granulocytes 02/17/2022 0.01  0.00 - 0.07 K/uL Final   Performed at [facility], [address], [place], [place]  [address]    Assessment and plan.  1. History of pulmonary embolism   2. Screening mammogram, encounter for     #Provoked pulmonary embolus associated with COVID-19 pneumonia. Patient  is finishing 6 months plus anticoagulation. She has finished 6 months+ Xarelto, Hypercoagulable work-up is negative. No need for long-term anticoagulation. Continue follow-up with cardiology and pulmonology.  #Health maintenance, bilateral mammogram showed no findings of suspicious malignancy. Obtain annual screening mammogram in July 2023. Recommend patient to get additional annual mammogram via primary care provider's office.  Currently she is in the process of getting a new PCP.  She would like me to order this years mammogram.  She will ask a new PCP to order annual screening mammogram in the future.   Patient will be discharged from our clinic today. I discussed the assessment and treatment plan with the patient.  The patient was provided an opportunity to ask questions and all were answered.  The patient agreed with the plan and demonstrated an understanding of the instructions.    Earlie Server, MD, PhD Hematology Oncology  02/17/2022

## 2022-02-23 ENCOUNTER — Ambulatory Visit: Payer: 59 | Admitting: Internal Medicine

## 2022-02-23 ENCOUNTER — Other Ambulatory Visit: Payer: Self-pay

## 2022-02-23 ENCOUNTER — Encounter: Payer: Self-pay | Admitting: Internal Medicine

## 2022-02-23 VITALS — BP 119/80 | HR 71 | Temp 97.9°F | Ht 68.9 in | Wt 256.4 lb

## 2022-02-23 DIAGNOSIS — R7989 Other specified abnormal findings of blood chemistry: Secondary | ICD-10-CM | POA: Diagnosis not present

## 2022-02-23 DIAGNOSIS — E782 Mixed hyperlipidemia: Secondary | ICD-10-CM | POA: Diagnosis not present

## 2022-02-23 DIAGNOSIS — I10 Essential (primary) hypertension: Secondary | ICD-10-CM

## 2022-02-23 DIAGNOSIS — E785 Hyperlipidemia, unspecified: Secondary | ICD-10-CM

## 2022-02-23 DIAGNOSIS — E079 Disorder of thyroid, unspecified: Secondary | ICD-10-CM

## 2022-02-23 LAB — URINALYSIS, ROUTINE W REFLEX MICROSCOPIC
Bilirubin, UA: NEGATIVE
Glucose, UA: NEGATIVE
Leukocytes,UA: NEGATIVE
Nitrite, UA: NEGATIVE
RBC, UA: NEGATIVE
Specific Gravity, UA: >1.03 — ABNORMAL HIGH (ref 1.005–1.030)
Urobilinogen, Ur: 0.2 mg/dL (ref 0.2–1.0)
pH, UA: 6.5 (ref 5.0–7.5)

## 2022-02-23 LAB — MICROSCOPIC EXAMINATION
Bacteria, UA: NONE SEEN
RBC, Urine: NONE SEEN /hpf (ref 0–2)
WBC, UA: NONE SEEN /hpf (ref 0–5)

## 2022-02-23 MED ORDER — FENOFIBRATE 145 MG PO TABS: 145.0000 mg | ORAL_TABLET | Freq: Every day | ORAL | 6 refills | Status: DC

## 2022-02-23 NOTE — Progress Notes (Signed)
? ?BP 119/80   Pulse 71   Temp 97.9 ?F (36.6 ?C) (Oral)   Ht 5' 8.9" (1.75 m)   Wt 256 lb 6.4 oz (116.3 kg)   SpO2 100%   BMI 37.98 kg/m?   ? ?Subjective:  ? ? Patient ID: Bianca Michael, female    DOB: 1966/12/18, 56 y.o.   MRN: [id number] ? ?Chief Complaint  ?Patient presents with  ?? Hyperlipidemia  ?  No labs done last week  ? ? ?HPI: ?Bianca Michael is a 56 y.o. female ? ?Pe was on xarolto x 6 months is on plavix per cards. ? ?Hyperlipidemia ?This is a chronic problem. The current episode started more than 1 year ago. The problem is controlled. Recent lipid tests were reviewed and are normal. She has no history of chronic renal disease, diabetes, hypothyroidism, liver disease, obesity or nephrotic syndrome. Pertinent negatives include no chest pain, myalgias or shortness of breath.  ?Thyroid Problem ?Presents for follow-up visit. Patient reports no anxiety, cold intolerance, constipation, depressed mood, diarrhea, fatigue, heat intolerance, hoarse voice, leg swelling, menstrual problem, nail problem, palpitations or tremors. Her past medical history is significant for hyperlipidemia. There is no history of diabetes.  ? ?Chief Complaint  ?Patient presents with  ?? Hyperlipidemia  ?  No labs done last week  ? ? ?Relevant past medical, surgical, family and social history reviewed and updated as indicated. Interim medical history since our last visit reviewed. ?Allergies and medications reviewed and updated. ? ?Review of Systems  ?Constitutional:  Negative for activity change, appetite change, chills, fatigue and fever.  ?HENT:  Negative for congestion, ear discharge, ear pain, facial swelling and hoarse voice.   ?Eyes:  Negative for pain and itching.  ?Respiratory:  Negative for cough, chest tightness, shortness of breath and wheezing.   ?Cardiovascular:  Negative for chest pain, palpitations and leg swelling.  ?Gastrointestinal:  Negative for abdominal distention, abdominal pain, blood in stool,  constipation, diarrhea, nausea and vomiting.  ?Endocrine: Negative for cold intolerance, heat intolerance, polydipsia, polyphagia and polyuria.  ?Genitourinary:  Negative for difficulty urinating, dysuria, flank pain, frequency, hematuria, menstrual problem and urgency.  ?Musculoskeletal:  Negative for arthralgias, gait problem, joint swelling and myalgias.  ?Skin:  Negative for color change, rash and wound.  ?Neurological:  Negative for dizziness, tremors, speech difficulty, weakness, light-headedness, numbness and headaches.  ?Hematological:  Does not bruise/bleed easily.  ?Psychiatric/Behavioral:  Negative for agitation, confusion, decreased concentration, sleep disturbance and suicidal ideas. The patient is not nervous/anxious.   ? ?Per HPI unless specifically indicated above ? ?   ?Objective:  ?  ?BP 119/80   Pulse 71   Temp 97.9 ?F (36.6 ?C) (Oral)   Ht 5' 8.9" (1.75 m)   Wt 256 lb 6.4 oz (116.3 kg)   SpO2 100%   BMI 37.98 kg/m?   ?Wt Readings from Last 3 Encounters:  ?02/23/22 256 lb 6.4 oz (116.3 kg)  ?02/17/22 256 lb 9.6 oz (116.4 kg)  ?01/04/22 259 lb 2 oz (117.5 kg)  ?  ?Physical Exam ?Vitals and nursing note reviewed.  ?Constitutional:   ?   General: She is not in acute distress. ?   Appearance: Normal appearance. She is not ill-appearing or diaphoretic.  ?Eyes:  ?   Conjunctiva/sclera: Conjunctivae normal.  ?Pulmonary:  ?   Breath sounds: No rhonchi.  ?Abdominal:  ?   General: Abdomen is flat. Bowel sounds are normal. There is no distension.  ?   Palpations: Abdomen is soft. There  is no mass.  ?   Tenderness: There is no abdominal tenderness. There is no guarding.  ?Skin: ?   General: Skin is warm and dry.  ?   Coloration: Skin is not jaundiced.  ?   Findings: No erythema.  ?Neurological:  ?   Mental Status: She is alert.  ? ? ?Results for orders placed or performed in visit on 02/23/22  ?Microscopic Examination  ? Urine  ?Result Value Ref Range  ? WBC, UA None seen 0 - 5 /hpf  ? RBC None seen 0 -  2 /hpf  ? Epithelial Cells (non renal) 0-10 0 - 10 /hpf  ? Mucus, UA Present (A) Not Estab.  ? Bacteria, UA None seen None seen/Few  ?Urinalysis, Routine w reflex microscopic  ?Result Value Ref Range  ? Specific Gravity, UA >1.030 (H) 1.005 - 1.030  ? pH, UA 6.5 5.0 - 7.5  ? Color, UA Yellow Yellow  ? Appearance Ur Cloudy (A) Clear  ? Leukocytes,UA Negative Negative  ? Protein,UA 2+ (A) Negative/Trace  ? Glucose, UA Negative Negative  ? Ketones, UA Trace (A) Negative  ? RBC, UA Negative Negative  ? Bilirubin, UA Negative Negative  ? Urobilinogen, Ur 0.2 0.2 - 1.0 mg/dL  ? Nitrite, UA Negative Negative  ? Microscopic Examination See below:   ? ?   ? ? ?Current Outpatient Medications:  ??  albuterol (VENTOLIN HFA) 108 (90 Base) MCG/ACT inhaler, Inhale into the lungs., Disp: , Rfl:  ??  Ascorbic Acid (VITAMIN C) 1000 MG tablet, Take 1,000 mg by mouth daily., Disp: , Rfl:  ??  atorvastatin (LIPITOR) 80 MG tablet, Take 1 tablet (80 mg total) by mouth daily., Disp: 90 tablet, Rfl: 3 ??  buPROPion (WELLBUTRIN XL) 300 MG 24 hr tablet, Take 1 tablet (300 mg total) by mouth daily., Disp: 30 tablet, Rfl: 4 ??  Cholecalciferol (VITAMIN D3 ULTRA STRENGTH PO), Take 5,000 Int'l Units/day by mouth., Disp: , Rfl:  ??  clopidogrel (PLAVIX) 75 MG tablet, Take 1 tablet (75 mg total) by mouth daily., Disp: 90 tablet, Rfl: 3 ??  fenofibrate (TRICOR) 145 MG tablet, Take 1 tablet (145 mg total) by mouth daily., Disp: 30 tablet, Rfl: 6 ??  halobetasol (ULTRAVATE) 0.05 % ointment, Apply topically 2 (two) times daily., Disp: , Rfl:  ??  hydroxychloroquine (PLAQUENIL) 200 MG tablet, Take 2 tablets (400 mg total) by mouth daily., Disp: 60 tablet, Rfl: 5 ??  hydrOXYzine (ATARAX/VISTARIL) 25 MG tablet, Take by mouth., Disp: , Rfl:  ??  levothyroxine (SYNTHROID) 112 MCG tablet, Take 1 tablet (112 mcg total) by mouth daily. needs appointment and labs for further refills, Disp: 30 tablet, Rfl: 0 ??  magnesium oxide (MAG-OX) 400 MG tablet, Take by  mouth., Disp: , Rfl:  ??  metoprolol tartrate (LOPRESSOR) 25 MG tablet, Take 1 tablet (25 mg total) by mouth 2 (two) times daily., Disp: 90 tablet, Rfl: 1 ??  Multiple Vitamins-Minerals (WOMENS MULTI) CAPS, Take 1 capsule by mouth daily., Disp: , Rfl:  ??  omeprazole (PRILOSEC) 40 MG capsule, TAKE 1 CAPSULE (40 MG TOTAL) BY MOUTH DAILY., Disp: 90 capsule, Rfl: 1 ??  sertraline (ZOLOFT) 25 MG tablet, Take 1 tablet (25 mg total) by mouth daily., Disp: 90 tablet, Rfl: 1 ??  Tiotropium Bromide-Olodaterol (STIOLTO RESPIMAT) 2.5-2.5 MCG/ACT AERS, Inhale 2 puffs into the lungs daily., Disp: 4 g, Rfl: 0 ??  zinc gluconate 50 MG tablet, Take 50 mg by mouth daily., Disp: , Rfl:   ? ? ?  Assessment & Plan:  ?CAD, three-vessel coronary artery calcification, Lexiscan Myoview with no ischemia.  Continue Plavix 75 mg daily, Lipitor 80 mg.  ? ?2. HLD is on lipitor and zetia per pt couldn't tolerate zetia. ?Add fenofibrate ?recheck FLP, check LFT's work on diet, SE of meds explained to pt. low fat and high fiber diet explained to pt. ? ?3. Hypothyroidism:  ?Tsh wnl sees Dr. Gershon Crane endocrinology is on 112 mcg of synthorid. Sees them every 6 months. ? ?4. Elevated Creat ? Sec to decreased water intake.  ?Pt needs to increase such . Aware of the above/ knows she needs to increase such . ?Fu x 6 motnhs seeing speciaists for the above. ? ? ?Problem List Items Addressed This Visit   ? ?  ? Cardiovascular and Mediastinum  ? Essential (primary) hypertension  ? Relevant Medications  ? fenofibrate (TRICOR) 145 MG tablet  ?  ? Endocrine  ? Thyroid disease  ?  ? Other  ? Hyperlipidemia - Primary  ? Relevant Medications  ? fenofibrate (TRICOR) 145 MG tablet  ? Elevated serum creatinine  ?  ? ?Orders Placed This Encounter  ?Procedures  ?? Microscopic Examination  ?  ? ?Meds ordered this encounter  ?Medications  ?? fenofibrate (TRICOR) 145 MG tablet  ?  Sig: Take 1 tablet (145 mg total) by mouth daily.  ?  Dispense:  30 tablet  ?  Refill:  6  ?   ? ?Follow up plan: ?Return in about 6 months (around 08/26/2022). ? ? ?

## 2022-02-24 ENCOUNTER — Encounter: Payer: Self-pay | Admitting: Pulmonary Disease

## 2022-02-24 ENCOUNTER — Ambulatory Visit (INDEPENDENT_AMBULATORY_CARE_PROVIDER_SITE_OTHER): Payer: 59 | Admitting: Pulmonary Disease

## 2022-02-24 VITALS — BP 124/80 | HR 68 | Temp 97.6°F | Ht 64.0 in | Wt 255.5 lb

## 2022-02-24 DIAGNOSIS — R0602 Shortness of breath: Secondary | ICD-10-CM

## 2022-02-24 DIAGNOSIS — R490 Dysphonia: Secondary | ICD-10-CM | POA: Diagnosis not present

## 2022-02-24 DIAGNOSIS — J8410 Pulmonary fibrosis, unspecified: Secondary | ICD-10-CM | POA: Diagnosis not present

## 2022-02-24 DIAGNOSIS — G4733 Obstructive sleep apnea (adult) (pediatric): Secondary | ICD-10-CM | POA: Diagnosis not present

## 2022-02-24 DIAGNOSIS — Z8616 Personal history of COVID-19: Secondary | ICD-10-CM

## 2022-02-24 DIAGNOSIS — Z6841 Body Mass Index (BMI) 40.0 and over, adult: Secondary | ICD-10-CM

## 2022-02-24 LAB — COMPREHENSIVE METABOLIC PANEL
ALT: 31 IU/L (ref 0–32)
AST: 21 IU/L (ref 0–40)
Albumin/Globulin Ratio: 1.6 (ref 1.2–2.2)
Albumin: 4.4 g/dL (ref 3.8–4.9)
Alkaline Phosphatase: 106 IU/L (ref 44–121)
BUN/Creatinine Ratio: 10 (ref 9–23)
BUN: 12 mg/dL (ref 6–24)
Bilirubin Total: 0.3 mg/dL (ref 0.0–1.2)
CO2: 25 mmol/L (ref 20–29)
Calcium: 9.9 mg/dL (ref 8.7–10.2)
Chloride: 103 mmol/L (ref 96–106)
Creatinine, Ser: 1.25 mg/dL — ABNORMAL HIGH (ref 0.57–1.00)
Globulin, Total: 2.7 g/dL (ref 1.5–4.5)
Glucose: 108 mg/dL — ABNORMAL HIGH (ref 70–99)
Potassium: 4.6 mmol/L (ref 3.5–5.2)
Sodium: 141 mmol/L (ref 134–144)
Total Protein: 7.1 g/dL (ref 6.0–8.5)
eGFR: 50 mL/min/{1.73_m2} — ABNORMAL LOW (ref >59)

## 2022-02-24 LAB — CBC WITH DIFFERENTIAL/PLATELET
Basophils Absolute: 0.1 10*3/uL (ref 0.0–0.2)
Basos: 2 %
EOS (ABSOLUTE): 0.3 10*3/uL (ref 0.0–0.4)
Eos: 4 %
Hematocrit: 44.7 % (ref 34.0–46.6)
Hemoglobin: 14.8 g/dL (ref 11.1–15.9)
Immature Grans (Abs): 0 10*3/uL (ref 0.0–0.1)
Immature Granulocytes: 0 %
Lymphocytes Absolute: 2.4 10*3/uL (ref 0.7–3.1)
Lymphs: 39 %
MCH: 27.7 pg (ref 26.6–33.0)
MCHC: 33.1 g/dL (ref 31.5–35.7)
MCV: 84 fL (ref 79–97)
Monocytes Absolute: 0.4 10*3/uL (ref 0.1–0.9)
Monocytes: 7 %
Neutrophils Absolute: 2.8 10*3/uL (ref 1.4–7.0)
Neutrophils: 48 %
Platelets: 193 10*3/uL (ref 150–450)
RBC: 5.34 x10E6/uL — ABNORMAL HIGH (ref 3.77–5.28)
RDW: 13.9 % (ref 11.7–15.4)
WBC: 6 10*3/uL (ref 3.4–10.8)

## 2022-02-24 LAB — LIPID PANEL
Chol/HDL Ratio: 3.8 ratio (ref 0.0–4.4)
Cholesterol, Total: 192 mg/dL (ref 100–199)
HDL: 51 mg/dL (ref >39)
LDL Chol Calc (NIH): 95 mg/dL (ref 0–99)
Triglycerides: 273 mg/dL — ABNORMAL HIGH (ref 0–149)
VLDL Cholesterol Cal: 46 mg/dL — ABNORMAL HIGH (ref 5–40)

## 2022-02-24 LAB — TSH: TSH: 2.4 u[IU]/mL (ref 0.450–4.500)

## 2022-02-24 MED ORDER — STIOLTO RESPIMAT 2.5-2.5 MCG/ACT IN AERS: 2.0000 | INHALATION_SPRAY | Freq: Every day | RESPIRATORY_TRACT | 0 refills | Status: DC

## 2022-02-24 NOTE — Progress Notes (Signed)
Subjective:    Patient ID: Bianca Michael, female    DOB: 07/18/1966, 56 y.o.   MRN: [id number] Patient Care Team: Charlynne Cousins, MD as PCP - General (Internal Medicine) Earlie Server, MD as Consulting Physician (Oncology)  Chief Complaint  Patient presents with   Follow-up    HPI Bianca Michael is a 56 year old remote former smoker (93 PY total) who presents for follow-up on an issue of dyspnea in the setting of prior severe COVID-19 infection and pulmonary embolism associated with the same.  This is a scheduled visit.  Recall she was initially evaluated on 06 September 2021.  At that time PFTs and  high-resolution CT chest and a 2D echo were ordered.  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  We discussed that weight loss can help her in this regard.  Her dyspnea continues to be an issue and is out of proportion to her pulmonary function and findings on CT chest.  CT chest does show some mild fibrotic changes that appear to be postinflammatory.  She also showed evidence of potential three-vessel CAD and was  referred to cardiology.  2D echo showed grade 1 DD, no wall motion abnormalities and mild mitral regurgitation.  Myoview stress test showed no evidence of ischemia.   She continues to be plagued with issues with hoarseness.  In addition she has issues with daytime somnolence.  She had a home sleep study performed which showed mild obstructive sleep apnea with AHI of 9 and O2 sats as low as 84%.  She has been unable to get CPAP due to nonpayment by her insurance.    Her dyspnea continues to be out of proportion to her pulmonary findings.  She sometimes gets a feeling that she cannot get a breath in this may be related to possible vocal cord dysfunction.  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  ENT felt that she would get benefit from speech therapy however this has not occurred as of yet.  She has not had any recent fevers, chills or sweats.  No cough or sputum production.  No hemoptysis.  Continues to complain of chest tightness and shortness of breath and inability to get a breath in particularly during exertion.  She gets erratic response from as needed albuterol.  She is applying for short-term disability.  No other issues endorsed today.   DATA: 09/07/2021  PFTs: FEV1 2.28 L or 86% predicted, FVC 2.57 L or 75% predicted, FEV1/FVC 89%, no bronchodilator response.  There is mild restrictive physiology due to ILD and/or obesity (ERV 24%) diffusion capacity was normal. 09/21/2021 CT chest high-resolution: Possible early interstitial lung disease areas of architectural distortion and groundglass attenuation likely representing infectious or inflammatory scarring.  Mild bronchiectasis. Evidence of three-vessel coronary artery disease.  Severe hepatic steatosis. 10/07/2021 2D echo: LVEF 60 to 65%, mild LVH.  Grade 1 DD.  Mild mitral regurg. 11/01/2021 Myoview: Normal stress test. 11/27/2021 Home sleep test: Mild obstructive sleep apnea with AHI of 9 and O2 sats as low as 84%.  Review of Systems A 10 point review of systems was performed and it is as noted above otherwise negative.  Patient Active  Problem List   Diagnosis Date Noted   Elevated serum creatinine 08/16/2021   Morbid obesity ([place]) 08/16/2021   Positive ANA (antinuclear antibody) 07/26/2021   Inflammatory arthritis 07/21/2021   Screening for heart disease 07/21/2021   COVID-19 07/21/2021   History of pulmonary embolism 07/21/2021   Anxiety 07/21/2021   Acute pulmonary embolism without acute cor pulmonale ([place]) 04/07/2021   Elevated liver enzymes 01/28/2021   Hyperlipidemia  01/21/2021   Chronic neck and back pain 03/24/2020   Essential (primary) hypertension 03/13/2020   Major depressive disorder 03/13/2020   Chronic ethmoidal sinusitis 04/02/2019   Chronic frontal sinusitis 04/02/2019   Chronic maxillary sinusitis 04/02/2019   Nasal polyp 04/02/2019   Thyroid disease 06/19/2018   Social History   Tobacco Use   Smoking status: Former    Packs/day: 1.00    Years: 15.00    Pack years: 15.00    Types: Cigarettes    Quit date: 12/26/2000    Years since quitting: 21.1   Smokeless tobacco: Never  Substance Use Topics   Alcohol use: Yes    Comment: very rarely   No Known Allergies  Current Meds  Medication Sig   albuterol (VENTOLIN HFA) 108 (90 Base) MCG/ACT inhaler Inhale into the lungs.   Ascorbic Acid (VITAMIN C) 1000 MG tablet Take 1,000 mg by mouth daily.   atorvastatin (LIPITOR) 80 MG tablet Take 1 tablet (80 mg total) by mouth daily.   buPROPion (WELLBUTRIN XL) 300 MG 24 hr tablet Take 1 tablet (300 mg total) by mouth daily.   Cholecalciferol (VITAMIN D3 ULTRA STRENGTH PO) Take 5,000 Int'l Units/day by mouth.   clopidogrel (PLAVIX) 75 MG tablet Take 1 tablet (75 mg total) by mouth daily.   fenofibrate (TRICOR) 145 MG tablet Take 1 tablet (145 mg total) by mouth daily.   halobetasol (ULTRAVATE) 0.05 % ointment Apply topically 2 (two) times daily.   hydroxychloroquine (PLAQUENIL) 200 MG tablet Take 2 tablets (400 mg total) by mouth daily.   hydrOXYzine (ATARAX/VISTARIL) 25 MG tablet Take by mouth.   levothyroxine (SYNTHROID) 112 MCG tablet Take 1 tablet (112 mcg total) by mouth daily. needs appointment and labs for further refills   magnesium oxide (MAG-OX) 400 MG tablet Take by mouth.   metoprolol tartrate (LOPRESSOR) 25 MG tablet Take 1 tablet (25 mg total) by mouth 2 (two) times daily.   Multiple Vitamins-Minerals (WOMENS MULTI) CAPS Take 1 capsule by mouth daily.   omeprazole (PRILOSEC) 40 MG capsule TAKE 1 CAPSULE (40 MG TOTAL) BY MOUTH  DAILY.   sertraline (ZOLOFT) 25 MG tablet Take 1 tablet (25 mg total) by mouth daily.   Tiotropium Bromide-Olodaterol (STIOLTO RESPIMAT) 2.5-2.5 MCG/ACT AERS Inhale 2 puffs into the lungs daily.   zinc gluconate 50 MG tablet Take 50 mg by mouth daily.   Immunization History  Administered Date(s) Administered   Hep A / Hep B 12/14/2021, 01/31/2022       Objective:   Physical Exam BP 124/80 (BP Location: Left Arm, Patient Position: Sitting, Cuff Size: Normal)   Pulse 68   Temp 97.6 F (36.4 C) (Oral)   Ht '5\' 4"'$  (1.626 m)   Wt 255 lb 8 oz (115.9 kg)   SpO2 97%   BMI 43.86 kg/m  GENERAL: Obese woman, no acute distress, fully ambulatory, raspy voice, no conversational dyspnea. HEAD: Normocephalic, atraumatic.  EYES: Pupils equal, round, reactive to light.  No scleral icterus.  MOUTH: Nose/mouth/throat not examined due to masking requirements for COVID 19. NECK: Supple. No  thyromegaly. Trachea midline. No JVD.  No adenopathy.  Well-healed tracheostomy scar. PULMONARY: Good air entry bilaterally.  No adventitious sounds. CARDIOVASCULAR: S1 and S2. Regular rate and rhythm.  No rubs, murmurs or gallops heard. ABDOMEN: Obese, well-healed PEG scar. MUSCULOSKELETAL: No joint deformity, no clubbing, no edema.  NEUROLOGIC: No focal deficit, no gait disturbance, speech is fluent. SKIN: Intact,warm,dry.  Mild discoloration of the toes distally, no frank ischemia.  Regrowing toenails. PSYCH: Mood and behavior normal       Assessment & Plan:     ICD-10-CM   1. Shortness of breath  R06.02    Trial of Stiolto Avoiding ICS due to dysphonia    2. 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 OSA (obstructive sleep apnea)  G47.33    Mild, AHI 9 Awaiting CPAP    5. Morbid obesity  with BMI of 40.0-44.9, adult ([place])  E66.01    Z68.41    Weight loss is recommended This will help her in regard to dyspnea    6. 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    Dispense:  4 g    Refill:  0    Order Specific Question:   Lot Number?    AnswerHC:[id number]    Order Specific Question:   Expiration Date?    Answer:   03/25/2024    Order Specific Question:   Quantity    Answer:   2   We reviewed the use of Stiolto for the patient and she was shown how to use the inhaler correctly.  Patient was encouraged to check on the coverage for her CPAP machine.  Patient was also instructed on weight loss which will help with her shortness of breath.  Will see her in follow-up in 2 months time she is to call sooner should any new problems arise.  Renold Don, MD Advanced Bronchoscopy PCC[facility]    *This note was dictated using voice recognition software/Dragon.  Despite best efforts to proofread, errors can occur which can change the meaning. Any transcriptional errors that result from this process are unintentional and may not be fully corrected at the time of dictation.

## 2022-02-24 NOTE — Patient Instructions (Addendum)
Continue Stiolto.  We did review how to use the inhaler correctly.  2 puffs daily.  Please do check on the coverage for your CPAP.  Given your vocal cord issues the CPAP may help you get more restorative sleep and help with decreasing shortness of breath during the day  Weight loss will also help with the shortness of breath.  We will see you in follow-up in 2 months time call sooner should any new problems arise.  Reviewed

## 2022-02-26 ENCOUNTER — Encounter: Payer: Self-pay | Admitting: Internal Medicine

## 2022-02-28 NOTE — Telephone Encounter (Signed)
We do not recommend this nor does the CDC pl let pt know thnx

## 2022-03-08 ENCOUNTER — Other Ambulatory Visit: Payer: Self-pay | Admitting: Internal Medicine

## 2022-03-08 NOTE — Telephone Encounter (Signed)
Refilled 12/31/2021 #30 4 refills - enough to last until 04/30/2022 ?Requested Prescriptions  ?Pending Prescriptions Disp Refills  ?? buPROPion (WELLBUTRIN XL) 300 MG 24 hr tablet [Pharmacy Med Name: BUPROPION HCL XL 300 MG TABLET] 90 tablet 2  ?  Sig: TAKE 1 TABLET BY MOUTH EVERY DAY  ?  ? Psychiatry: Antidepressants - bupropion Failed - 03/08/2022 11:35 AM  ?  ?  Failed - Cr in normal range and within 360 days  ?  Creatinine, Ser  ?Date Value Ref Range Status  ?02/23/2022 1.25 (H) 0.57 - 1.00 mg/dL Final  ?   ?  ?  Passed - AST in normal range and within 360 days  ?  AST  ?Date Value Ref Range Status  ?02/23/2022 21 0 - 40 IU/L Final  ?   ?  ?  Passed - ALT in normal range and within 360 days  ?  ALT  ?Date Value Ref Range Status  ?02/23/2022 31 0 - 32 IU/L Final  ?   ?  ?  Passed - Completed PHQ-2 or PHQ-9 in the last 360 days  ?  ?  Passed - Last BP in normal range  ?  BP Readings from Last 1 Encounters:  ?02/24/22 124/80  ?   ?  ?  Passed - Valid encounter within last 6 months  ?  Recent Outpatient Visits   ?      ? 1 week ago Mixed hyperlipidemia  ? [facility] Vigg, Avanti, MD  ? 2 months ago Hyperlipidemia, unspecified hyperlipidemia type  ? [facility] Vigg, Avanti, MD  ? 2 months ago Acute cough  ? [facility] McElwee, Lauren A, NP  ? 4 months ago Fatty liver  ? [facility] Vigg, Avanti, MD  ? 5 months ago Essential (primary) hypertension  ? [facility] Vigg, Avanti, MD  ?  ?  ?Future Appointments   ?        ? In 1 month Rice, Jamesetta Orleans, MD [facility]  ? In 2 months  [facility] [place]  ? In 5 months Vigg, Avanti, MD [facility]  ?  ? ?  ?  ?  ? ?

## 2022-03-24 DIAGNOSIS — Z86711 Personal history of pulmonary embolism: Secondary | ICD-10-CM | POA: Diagnosis not present

## 2022-03-24 DIAGNOSIS — I10 Essential (primary) hypertension: Secondary | ICD-10-CM | POA: Diagnosis not present

## 2022-03-24 DIAGNOSIS — I2510 Atherosclerotic heart disease of native coronary artery without angina pectoris: Secondary | ICD-10-CM | POA: Diagnosis not present

## 2022-03-24 DIAGNOSIS — E785 Hyperlipidemia, unspecified: Secondary | ICD-10-CM | POA: Diagnosis not present

## 2022-03-31 ENCOUNTER — Other Ambulatory Visit: Payer: Self-pay | Admitting: Internal Medicine

## 2022-03-31 NOTE — Telephone Encounter (Signed)
Requested Prescriptions  ?Pending Prescriptions Disp Refills  ?? metoprolol tartrate (LOPRESSOR) 25 MG tablet [Pharmacy Med Name: METOPROLOL TARTRATE 25 MG TAB] 180 tablet 0  ?  Sig: TAKE 1 TABLET BY MOUTH TWICE A DAY  ?  ? Cardiovascular:  Beta Blockers Passed - 03/31/2022  2:23 AM  ?  ?  Passed - Last BP in normal range  ?  BP Readings from Last 1 Encounters:  ?02/24/22 124/80  ?   ?  ?  Passed - Last Heart Rate in normal range  ?  Pulse Readings from Last 1 Encounters:  ?02/24/22 68  ?   ?  ?  Passed - Valid encounter within last 6 months  ?  Recent Outpatient Visits   ?      ? 1 month ago Mixed hyperlipidemia  ? [facility] Vigg, Avanti, MD  ? 2 months ago Hyperlipidemia, unspecified hyperlipidemia type  ? [facility] Vigg, Avanti, MD  ? 3 months ago Acute cough  ? [facility], Lauren A, NP  ? 5 months ago Fatty liver  ? [facility] Vigg, Avanti, MD  ? 6 months ago Essential (primary) hypertension  ? [facility] Vigg, Avanti, MD  ?  ?  ?Future Appointments   ?        ? In 1 week Rice, Jamesetta Orleans, MD [facility]  ? In 1 month  [facility] [place]  ? In 4 months Vigg, Avanti, MD [facility]  ?  ? ?  ?  ?  ? ? ?

## 2022-04-10 NOTE — Progress Notes (Signed)
? ?Office Visit Note ? ?Patient: Bianca FerdinandCatherine Michael Michael             ?Date of Birth: 26-Mar-1966           ?MRN: [id number]             ?PCP: Mick SellFitzgerald, David P, MD ?Referring: No ref. provider found ?Visit Date: 04/11/2022 ? ? ?Subjective:  ?Arthritis (Doing good) ? ? ?History of Present Illness: Bianca SequinCatherine Michael Michael is a 56 y.o. 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 Overall has been doing pretty well but for past about 3 weeks has increased pain and stiffness in right hand and left elbow. Unable to fully extend and flex her left 3rd finger with some triggering. Left elbow feels okay in flexed position some more pain when extending and with rotational movements. She is taking tylenol as needed in addition to her hydroxychloroquine which helps partially.  ? ?Previous HPI ?10/11/21 ?Bianca Michael is a 56 y.o. female here for follow up for seronegative arthritis after starting hydroxychloroquine 400 mg PO daily. At previous visit objective inflammation at the wrist suggestive for dequervains but also polyarthralgia and fatigue. Hand and wrist pain improved a lot now able to make tight fists, but has had some increase overall she associates with timing of weather changes. She sustained a fall after walking for a long time at the mall with no good location to sit for a break and struck the right side of her head. Currently bilateral shoulder pain is worse, especially on left. This has woken her from sleep a few times. No numbness down the arms.  ?   ?Previous HPI: ?07/26/21 ?Bianca Michael is a 56 y.o. female here for evaluation of positive ANA with recent symptoms of joint pains, pulmonary embolus on xarelto, and peripheral ischemia in toes transiently attributed to vasopressor treatment with loss of toenails. 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 ?Overall she has been feeling in about usual health till  becoming acutely ill earlier this year.  She does recall some bronchitis type symptoms preceding the January COVID illness and hospitalization.  She does not recall much from that stay since she experienced significant portion of 2 months unconscious in ICU requiring tracheostomy for ventilator support after pulmonary embolus and collapsed lung.  After initially in the hospital she required extensive physical therapy with inability to walk or even transfer independently.  She does feel she has been more reliant on her upper extremity mobility during the initial period due to severe leg weakness. ?Currently her worst affected area is the right wrist with ongoing pain and swelling and decreased ability to flex and extend without pain. This has been persistently swollen for weeks, the left wrist hurts just intermittently and without visible swelling. ?She does have family history with first-degree relative and carrying diagnosis of lupus who has been treated with Plaquenil and prednisone. ?  ?Labs reviewed ?06/2021 ?ANA pos ?dsDNA 13 ?RNP, Smith, SSA, SSB, chromatin, Jo-1, centromere neg ?RF 11.3 ?Uric acid 7.1 ?B2GP1 IgA/IgM/IgG neg ?  ?03/2021 ?B2GP1 IgG 60 ?ACA neg ?LA neg ?  ?Imaging reviewed ?07/21/21 Xray right knee 4 views ?Mild osteoarthritis appears most advanced in the medial compartments. ?07/21/21 Xray left knee 4 views ?Mild osteoarthritis appears most advanced in the medial compartments. ?07/21/21 Xray right wrist ?No acute finding. Mild joint space narrowing at the radiocarpal joint in the region of  the radial styloid and navicular consistent with mild osteoarthritis. ?07/21/21 Xray chest 2 views ?Mild bronchial thickening. Streaky opacity at the left lung base, may be atelectasis or scarring. No confluent consolidation. ? ? ?Review of Systems  ?Constitutional:  Positive for fatigue.  ?HENT:  Positive for mouth dryness.   ?Eyes:  Positive for dryness.  ?Respiratory:  Positive for shortness of breath.    ?Cardiovascular:  Positive for swelling in legs/feet.  ?Gastrointestinal:  Negative for constipation.  ?Endocrine: Positive for heat intolerance.  ?Genitourinary:  Negative for difficulty urinating.  ?Musculoskeletal:  Positive for joint pain, gait problem, joint pain, joint swelling, muscle weakness, morning stiffness and muscle tenderness.  ?Skin:  Negative for rash.  ?Allergic/Immunologic: Positive for susceptible to infections.  ?Neurological:  Positive for numbness and weakness.  ?Hematological:  Negative for bruising/bleeding tendency.  ?Psychiatric/Behavioral:  Positive for sleep disturbance.   ? ?PMFS History:  ?Patient Active Problem List  ? Diagnosis Date Noted  ? Elevated serum creatinine 08/16/2021  ? Morbid obesity (HCC) 08/16/2021  ? Positive ANA (antinuclear antibody) 07/26/2021  ? Inflammatory arthritis 07/21/2021  ? Screening for heart disease 07/21/2021  ? COVID-19 07/21/2021  ? History of pulmonary embolism 07/21/2021  ? Anxiety 07/21/2021  ? Acute pulmonary embolism without acute cor pulmonale (HCC) 04/07/2021  ? Elevated liver enzymes 01/28/2021  ? Hyperlipidemia 01/21/2021  ? Chronic neck and back pain 03/24/2020  ? Essential (primary) hypertension 03/13/2020  ? Major depressive disorder 03/13/2020  ? Chronic ethmoidal sinusitis 04/02/2019  ? Chronic frontal sinusitis 04/02/2019  ? Chronic maxillary sinusitis 04/02/2019  ? Nasal polyp 04/02/2019  ? Thyroid disease 06/19/2018  ?  ?Past Medical History:  ?Diagnosis Date  ? Anxiety   ? Chronic kidney disease   ? COVID-19 12/2020  ? Patient was hospitalized  ? Depression   ? Hyperlipidemia   ? Hypertension   ? Inflammatory arthritis   ? Pneumothorax   ? Left lung  ? Pulmonary embolism (HCC)   ? Thyroid disease   ?  ?Family History  ?Problem Relation Age of Onset  ? Breast cancer Mother 57  ? Colon cancer Mother   ? Heart attack Father   ? Heart disease Father   ? High Cholesterol Father   ? Thyroid disease Sister   ? Breast cancer Sister   ?  Thyroid disease Sister   ? Thyroid disease Sister   ? Lupus Sister   ? Rheum arthritis Sister   ? Thyroid disease Daughter   ? ?Past Surgical History:  ?Procedure Laterality Date  ? CARPAL TUNNEL RELEASE    ? CHOLECYSTECTOMY    ? TONSILLECTOMY  1970  ? TUBAL LIGATION    ? ?Social History  ? ?Social History Narrative  ? Not on file  ? ?Immunization History  ?Administered Date(s) Administered  ? Hep A / Hep B 12/14/2021, 01/31/2022  ?  ? ?Objective: ?Vital Signs: BP 132/82 (BP Location: Left Arm, Patient Position: Sitting, Cuff Size: Normal)   Pulse 75   Resp 16   Ht 5\' 4"  (1.626 m)   Wt 261 lb (118.4 kg)   BMI 44.80 kg/m?   ? ?Physical Exam ?Constitutional:   ?   Appearance: She is obese.  ?Cardiovascular:  ?   Rate and Rhythm: Normal rate and regular rhythm.  ?Pulmonary:  ?   Effort: Pulmonary effort is normal.  ?   Breath sounds: Normal breath sounds.  ?Musculoskeletal:  ?   Right lower leg: No edema.  ?   Left  lower leg: No edema.  ?Skin: ?   General: Skin is warm and dry.  ?   Findings: No rash.  ?Neurological:  ?   Mental Status: She is alert.  ?  ? ?Musculoskeletal Exam:  ?Shoulders full ROM no tenderness or swelling ?Left elbow pain with resisted supination or pronation, tenderness worst at lateral and medial epicondyles ?Right wrist and 3rd digit tenderness at MCP and PIP joints, decreased flexion and unable to fully extend, overlying skin tightness on flexor side, limited ultrasound inspection without visible swelling or doppler enhancement ?Knees full ROM no swelling bilateral patellofemoral crepitus present ?Ankles full ROM no tenderness or swelling ? ? ?CDAI Exam: ?CDAI Score: 11  ?Patient Global: 40 mm; Provider Global: 20 mm ?Swollen: 0 ; Tender: 5  ?Joint Exam 04/11/2022  ? ?   Right  Left  ?Elbow      Tender  ?Wrist   Tender     ?MCP 2   Tender     ?MCP 3   Tender     ?PIP 3   Tender     ? ? ? ?Investigation: ?No additional findings. ? ?Imaging: ?No results found. ? ?Recent Labs: ?Lab Results   ?Component Value Date  ? WBC 6.0 02/23/2022  ? HGB 14.8 02/23/2022  ? PLT 193 02/23/2022  ? NA 141 02/23/2022  ? K 4.6 02/23/2022  ? CL 103 02/23/2022  ? CO2 25 02/23/2022  ? GLUCOSE 108 (H) 02/23/2022  ? BUN 12 03/01/202

## 2022-04-11 ENCOUNTER — Ambulatory Visit: Payer: 59 | Admitting: Internal Medicine

## 2022-04-11 ENCOUNTER — Encounter: Payer: Self-pay | Admitting: Internal Medicine

## 2022-04-11 VITALS — BP 132/82 | HR 75 | Resp 16 | Ht 64.0 in | Wt 261.0 lb

## 2022-04-11 DIAGNOSIS — M1990 Unspecified osteoarthritis, unspecified site: Secondary | ICD-10-CM

## 2022-04-11 DIAGNOSIS — N1831 Chronic kidney disease, stage 3a: Secondary | ICD-10-CM | POA: Diagnosis not present

## 2022-04-11 DIAGNOSIS — R768 Other specified abnormal immunological findings in serum: Secondary | ICD-10-CM

## 2022-04-11 DIAGNOSIS — I10 Essential (primary) hypertension: Secondary | ICD-10-CM | POA: Diagnosis not present

## 2022-04-12 ENCOUNTER — Other Ambulatory Visit: Payer: Self-pay | Admitting: Internal Medicine

## 2022-04-12 LAB — SEDIMENTATION RATE: Sed Rate: 2 mm/h (ref 0–30)

## 2022-04-12 LAB — C-REACTIVE PROTEIN: CRP: 1.7 mg/L (ref <8.0)

## 2022-04-12 NOTE — Telephone Encounter (Signed)
Requested medications are due for refill today. unsure ? ?Requested medications are on the active medications list.  yes ? ?Last refill. 12/31/2021 #30 4 refills ? ?Future visit scheduled.   yes ? ?Notes to clinic.  Dr. Ola Spurr listed as PCP. Per chart pt has established care at that office 03/24/2022. Per chart pt is no longer taking this medication 04/11/2022.  ? ? ? ?Requested Prescriptions  ?Pending Prescriptions Disp Refills  ? buPROPion (WELLBUTRIN XL) 300 MG 24 hr tablet [Pharmacy Med Name: BUPROPION HCL XL 300 MG TABLET] 90 tablet 2  ?  Sig: TAKE 1 TABLET BY MOUTH EVERY DAY  ?  ? Psychiatry: Antidepressants - bupropion Failed - 04/12/2022  8:31 AM  ?  ?  Failed - Cr in normal range and within 360 days  ?  Creatinine, Ser  ?Date Value Ref Range Status  ?02/23/2022 1.25 (H) 0.57 - 1.00 mg/dL Final  ?  ?  ?  ?  Passed - AST in normal range and within 360 days  ?  AST  ?Date Value Ref Range Status  ?02/23/2022 21 0 - 40 IU/L Final  ?  ?  ?  ?  Passed - ALT in normal range and within 360 days  ?  ALT  ?Date Value Ref Range Status  ?02/23/2022 31 0 - 32 IU/L Final  ?  ?  ?  ?  Passed - Completed PHQ-2 or PHQ-9 in the last 360 days  ?  ?  Passed - Last BP in normal range  ?  BP Readings from Last 1 Encounters:  ?04/11/22 132/82  ?  ?  ?  ?  Passed - Valid encounter within last 6 months  ?  Recent Outpatient Visits   ? ?      ? 1 month ago Mixed hyperlipidemia  ? [facility] Vigg, Avanti, MD  ? 3 months ago Hyperlipidemia, unspecified hyperlipidemia type  ? [facility] Vigg, Avanti, MD  ? 3 months ago Acute cough  ? [facility], Lauren A, NP  ? 5 months ago Fatty liver  ? [facility] Vigg, Avanti, MD  ? 6 months ago Essential (primary) hypertension  ? [facility] Vigg, Avanti, MD  ? ?  ?  ?Future Appointments   ? ?        ? In 1 month  [place]  ? In 4 months Vigg, Avanti, MD [facility]  ? In 6 months Rice,  Resa Miner, MD [facility]  ? ?  ? ? ?  ?  ?  ?  ?

## 2022-04-13 NOTE — Progress Notes (Signed)
Inflammation markers are normal this suggests against RA flaring up as a cause for joint pains. ?If she still doesn't notice any improvement within the next week we could still try a short course of steroids to see how much dose that help.

## 2022-04-16 ENCOUNTER — Other Ambulatory Visit: Payer: Self-pay | Admitting: Internal Medicine

## 2022-04-16 DIAGNOSIS — M1380 Other specified arthritis, unspecified site: Secondary | ICD-10-CM

## 2022-04-18 NOTE — Telephone Encounter (Signed)
Pharmacy does not need refill

## 2022-05-03 ENCOUNTER — Encounter: Payer: Self-pay | Admitting: Pulmonary Disease

## 2022-05-03 ENCOUNTER — Ambulatory Visit: Payer: 59 | Admitting: Pulmonary Disease

## 2022-05-03 VITALS — BP 118/76 | HR 84 | Temp 98.1°F | Ht 64.0 in | Wt 257.0 lb

## 2022-05-03 DIAGNOSIS — R490 Dysphonia: Secondary | ICD-10-CM | POA: Diagnosis not present

## 2022-05-03 DIAGNOSIS — J386 Stenosis of larynx: Secondary | ICD-10-CM

## 2022-05-03 DIAGNOSIS — R0602 Shortness of breath: Secondary | ICD-10-CM | POA: Diagnosis not present

## 2022-05-03 DIAGNOSIS — G4733 Obstructive sleep apnea (adult) (pediatric): Secondary | ICD-10-CM

## 2022-05-03 DIAGNOSIS — J8410 Pulmonary fibrosis, unspecified: Secondary | ICD-10-CM

## 2022-05-03 NOTE — Patient Instructions (Signed)
Continue your medications as they are. ? ?I think that your upper airway issues are adding to your shortness of breath during activity not necessarily during rest. ? ?I am going to refer you to Dr. Barbee Shropshire at [facility] he does have a very good voice disorder center. ? ?We will refer you to pulmonary rehab. ? ?Going to get an overnight oxygen level. ? ?See you in follow-up in 3 months time call sooner should any new problems arise. ?

## 2022-05-03 NOTE — Progress Notes (Signed)
Subjective:    Patient ID: Bianca Michael, female    DOB: 1966-07-14, 56 y.o.   MRN: [id number] Patient Care Team: Leonel Ramsay, MD as PCP - General (Infectious Diseases)  Chief Complaint  Patient presents with   Follow-up    SOB with exertion and non prod cough.     HPI Patient is a 56 year old remote former smoker who follows up for the issue of dyspnea after prolonged hospitalization for COVID-19 in January 2022 with intubation and subsequent tracheostomy.  After removal of her tracheostomy the patient has had persistent hoarseness.  She has had studies as noted below she has mild postinflammatory pulmonary fibrosis.  Her PFTs were mostly restrictive due to postinflammatory pulmonary fibrosis and obesity.  She has been using Stiolto which helps with cough and somewhat with shortness of breath.  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  The right true cord was normal.  Per the patient ENT was to refer her for "voice exercises" but she never got this referral.  It appears that when she exerts herself her main issue is shortness of breath on inspiration (it is hard to get a breath in).  This raises the possibility that she may have some exercise-induced laryngeal obstruction from her prior issues during hospitalization for COVID.  She has not gotten auto CPAP prescribed for her sleep apnea because of issues with payment by her insurance.  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   Overall she feels that she is stable.  She does not endorse any new symptom from prior.  DATA: 09/07/2021  PFTs: FEV1 2.28 L or 86% predicted, FVC 2.57 L or 75% predicted, FEV1/FVC 89%, no bronchodilator response.  There is mild restrictive physiology due to ILD and/or obesity (ERV 24%) diffusion capacity was normal. 09/21/2021 CT chest  high-resolution: Possible early interstitial lung disease areas of architectural distortion and groundglass attenuation likely representing infectious or inflammatory scarring.  Mild bronchiectasis. Evidence of three-vessel coronary artery disease.  Severe hepatic steatosis. 10/07/2021 2D echo: LVEF 60 to 65%, mild LVH.  Grade 1 DD.  Mild mitral regurg. 11/01/2021 Myoview: Normal stress test. 11/27/2021 Home sleep study: Mild obstructive sleep apnea with AHI of 9 and SPO2 low of 84%.   Review of Systems A 10 point review of systems was performed and it is as noted above otherwise negative.  Patient Active Problem List   Diagnosis Date Noted   Elevated serum creatinine 08/16/2021   Morbid obesity ([place]) 08/16/2021   Positive ANA (antinuclear antibody) 07/26/2021   Inflammatory arthritis 07/21/2021   Screening for heart disease 07/21/2021   COVID-19 07/21/2021   History of pulmonary embolism 07/21/2021   Anxiety 07/21/2021   Acute pulmonary embolism without acute cor pulmonale ([place]) 04/07/2021   Elevated liver enzymes 01/28/2021   Hyperlipidemia 01/21/2021   Chronic neck and back pain 03/24/2020   Essential (primary) hypertension 03/13/2020   Major depressive disorder 03/13/2020   Chronic ethmoidal sinusitis 04/02/2019   Chronic frontal sinusitis 04/02/2019   Chronic maxillary sinusitis 04/02/2019   Nasal polyp 04/02/2019   Thyroid disease 06/19/2018   Social History   Tobacco Use   Smoking status: Former    Packs/day: 1.00    Years: 15.00    Pack years: 15.00    Types: Cigarettes    Quit date: 12/26/2000  Years since quitting: 21.3   Smokeless tobacco: Never  Substance Use Topics   Alcohol use: Yes    Comment: very rarely   No Known Allergies  Current Meds  Medication Sig   albuterol (VENTOLIN HFA) 108 (90 Base) MCG/ACT inhaler Inhale into the lungs.   Ascorbic Acid (VITAMIN C) 1000 MG tablet Take 1,000 mg by mouth daily.   atorvastatin (LIPITOR) 80 MG tablet Take 1  tablet (80 mg total) by mouth daily.   buPROPion HCl ER, XL, 450 MG TB24 Take by mouth.   Cholecalciferol (VITAMIN D3 ULTRA STRENGTH PO) Take 5,000 Int'l Units/day by mouth.   clopidogrel (PLAVIX) 75 MG tablet Take 1 tablet (75 mg total) by mouth daily.   [EXPIRED] halobetasol (ULTRAVATE) 0.05 % ointment Apply topically 2 (two) times daily.   hydroxychloroquine (PLAQUENIL) 200 MG tablet TAKE 2 TABLETS BY MOUTH EVERY DAY   levothyroxine (SYNTHROID) 112 MCG tablet Take 1 tablet (112 mcg total) by mouth daily. needs appointment and labs for further refills   magnesium oxide (MAG-OX) 400 MG tablet Take by mouth.   metoprolol tartrate (LOPRESSOR) 25 MG tablet TAKE 1 TABLET BY MOUTH TWICE A DAY   Multiple Vitamins-Minerals (WOMENS MULTI) CAPS Take 1 capsule by mouth daily.   omeprazole (PRILOSEC) 40 MG capsule TAKE 1 CAPSULE (40 MG TOTAL) BY MOUTH DAILY.   Tiotropium Bromide-Olodaterol (STIOLTO RESPIMAT) 2.5-2.5 MCG/ACT AERS Inhale 2 puffs into the lungs daily.   zinc gluconate 50 MG tablet Take 50 mg by mouth daily.   Immunization History  Administered Date(s) Administered   Hep A / Hep B 12/14/2021, 01/31/2022       Objective:   Physical Exam BP 118/76 (BP Location: Left Arm, Cuff Size: Large)   Pulse 84   Temp 98.1 F (36.7 C) (Temporal)   Ht 5\' 4"  (1.626 m)   Wt 257 lb (116.6 kg) Comment: weight is per patient  SpO2 95%   BMI 44.11 kg/m  GENERAL: Obese woman, no acute distress, fully ambulatory, raspy voice, no conversational dyspnea. HEAD: Normocephalic, atraumatic.  EYES: Pupils equal, round, reactive to light.  No scleral icterus.  MOUTH: Nose/mouth/throat not examined due to masking requirements for COVID 19. NECK: Supple. No thyromegaly. Trachea midline. No JVD.  No adenopathy.  Well-healed tracheostomy scar. PULMONARY: Good air entry bilaterally.  No adventitious sounds. CARDIOVASCULAR: S1 and S2. Regular rate and rhythm.  No rubs, murmurs or gallops heard. ABDOMEN: Obese,  well-healed PEG scar. MUSCULOSKELETAL: No joint deformity, no clubbing, no edema.  NEUROLOGIC: No focal deficit, no gait disturbance, speech is fluent. SKIN: Intact,warm,dry.  Mild discoloration of the toes distally, no frank ischemia.  Regrowing toenails. PSYCH: Mood and behavior normal       Assessment & Plan:     ICD-10-CM   1. Shortness of breath  R06.02 AMB referral to pulmonary rehabilitation   On inspiration not exhalation Query EILO Pulmonary rehab Somewhat improved by Stiolto Obesity and deconditioning also playing a part Weight loss    2. Exercise induced laryngeal obstruction (EILO) -rule out  J38.6    Uncertain about this possibility Thad Ranger to [facility] for evaluation    3. Voice hoarsenesses  R49.0 Ambulatory referral to ENT   Noted to have prolapse of left vocal cord Query other functional issues Referral to ENT at [facility]    4. Postinflammatory pulmonary fibrosis (HCC)  J84.10 Pulse oximetry, overnight   Post COVID-19 Continue to follow expectantly    5. OSA (obstructive sleep apnea)  G47.33  Patient unable to procure CPAP at present She will investigate with insurance with regards to CPAP Obtain overnight oximetry     Orders Placed This Encounter  Procedures   AMB referral to pulmonary rehabilitation    Referral Priority:   Routine    Referral Type:   Consultation    Number of Visits Requested:   1   Ambulatory referral to ENT    Referral Priority:   Routine    Referral Type:   Consultation    Referral Reason:   Specialty Services Required    Requested Specialty:   Otolaryngology    Number of Visits Requested:   1   Pulse oximetry, overnight    On roomair.   DME:[facility]    Standing Status:   Future    Standing Expiration Date:   05/04/2023   Given the findings by [facility] and the need for voice training and to evaluate for the possibility of of exercise-induced laryngeal obstruction, after discussion with the patient she would like a second  evaluation for second opinion in this regard and she will be referred to Dr. Beverely Risen at [facility].  The patient in follow-up in 3 months time she is to call sooner should any new difficulties arise.  Renold Don, MD Advanced Bronchoscopy PCCM [facility]    *This note was dictated using voice recognition software/Dragon.  Despite best efforts to proofread, errors can occur which can change the meaning. Any transcriptional errors that result from this process are unintentional and may not be fully corrected at the time of dictation.

## 2022-05-05 DIAGNOSIS — J849 Interstitial pulmonary disease, unspecified: Secondary | ICD-10-CM | POA: Diagnosis not present

## 2022-05-05 DIAGNOSIS — G4733 Obstructive sleep apnea (adult) (pediatric): Secondary | ICD-10-CM | POA: Diagnosis not present

## 2022-05-05 DIAGNOSIS — E039 Hypothyroidism, unspecified: Secondary | ICD-10-CM | POA: Diagnosis not present

## 2022-05-05 DIAGNOSIS — E785 Hyperlipidemia, unspecified: Secondary | ICD-10-CM | POA: Diagnosis not present

## 2022-05-05 DIAGNOSIS — Z0000 Encounter for general adult medical examination without abnormal findings: Secondary | ICD-10-CM | POA: Diagnosis not present

## 2022-05-05 DIAGNOSIS — Z86711 Personal history of pulmonary embolism: Secondary | ICD-10-CM | POA: Diagnosis not present

## 2022-05-05 DIAGNOSIS — I2510 Atherosclerotic heart disease of native coronary artery without angina pectoris: Secondary | ICD-10-CM | POA: Diagnosis not present

## 2022-05-05 DIAGNOSIS — I10 Essential (primary) hypertension: Secondary | ICD-10-CM | POA: Diagnosis not present

## 2022-05-12 ENCOUNTER — Encounter: Payer: Self-pay | Admitting: Pulmonary Disease

## 2022-05-16 ENCOUNTER — Ambulatory Visit: Payer: Disability Insurance

## 2022-05-16 DIAGNOSIS — C220 Liver cell carcinoma: Secondary | ICD-10-CM | POA: Diagnosis not present

## 2022-05-17 DIAGNOSIS — K219 Gastro-esophageal reflux disease without esophagitis: Secondary | ICD-10-CM | POA: Diagnosis not present

## 2022-05-17 DIAGNOSIS — R490 Dysphonia: Secondary | ICD-10-CM | POA: Diagnosis not present

## 2022-05-17 DIAGNOSIS — J301 Allergic rhinitis due to pollen: Secondary | ICD-10-CM | POA: Diagnosis not present

## 2022-05-27 ENCOUNTER — Telehealth: Payer: Self-pay | Admitting: Pulmonary Disease

## 2022-05-27 DIAGNOSIS — J8410 Pulmonary fibrosis, unspecified: Secondary | ICD-10-CM

## 2022-05-27 NOTE — Telephone Encounter (Signed)
ONO reviewed by Dr. Jayme Cloud- recommend 2L QHS. Lowest SPO2 77%  Lm for patient.

## 2022-05-30 DIAGNOSIS — J8410 Pulmonary fibrosis, unspecified: Secondary | ICD-10-CM | POA: Diagnosis not present

## 2022-05-30 NOTE — Telephone Encounter (Signed)
Patient is aware of results and voiced her understanding.  Order placed to adapt.  Nothing further needed.

## 2022-06-29 DIAGNOSIS — J8410 Pulmonary fibrosis, unspecified: Secondary | ICD-10-CM | POA: Diagnosis not present

## 2022-07-25 ENCOUNTER — Ambulatory Visit
Admission: RE | Admit: 2022-07-25 | Discharge: 2022-07-25 | Disposition: A | Discharge status: Home / Self Care | Payer: 59 | Source: Ambulatory Visit | Attending: Oncology | Admitting: Oncology

## 2022-07-25 DIAGNOSIS — Z1231 Encounter for screening mammogram for malignant neoplasm of breast: Secondary | ICD-10-CM | POA: Insufficient documentation

## 2022-07-30 DIAGNOSIS — J8410 Pulmonary fibrosis, unspecified: Secondary | ICD-10-CM | POA: Diagnosis not present

## 2022-08-03 ENCOUNTER — Ambulatory Visit: Payer: 59 | Admitting: Pulmonary Disease

## 2022-08-03 ENCOUNTER — Encounter: Payer: Self-pay | Admitting: Pulmonary Disease

## 2022-08-03 VITALS — BP 130/80 | HR 83 | Temp 97.9°F | Ht 64.0 in | Wt 256.5 lb

## 2022-08-03 DIAGNOSIS — G4733 Obstructive sleep apnea (adult) (pediatric): Secondary | ICD-10-CM | POA: Diagnosis not present

## 2022-08-03 DIAGNOSIS — J386 Stenosis of larynx: Secondary | ICD-10-CM | POA: Diagnosis not present

## 2022-08-03 DIAGNOSIS — J8410 Pulmonary fibrosis, unspecified: Secondary | ICD-10-CM | POA: Diagnosis not present

## 2022-08-03 DIAGNOSIS — R0602 Shortness of breath: Secondary | ICD-10-CM

## 2022-08-03 MED ORDER — STIOLTO RESPIMAT 2.5-2.5 MCG/ACT IN AERS: 2.0000 | INHALATION_SPRAY | Freq: Every day | RESPIRATORY_TRACT | 0 refills | Status: DC

## 2022-08-03 NOTE — Progress Notes (Signed)
Subjective:    Patient ID: Bianca Michael, female    DOB: 10/16/1966, 56 y.o.   MRN: [id number] Patient Care Team: Leonel Ramsay, MD as PCP - General (Infectious Diseases)  Chief Complaint  Patient presents with   Follow-up    SOB with exertion.    HPI Patient is a 56 year old remote former smoker who follows up for the issue of dyspnea after prolonged hospitalization for COVID-19 in January 2022 with intubation and subsequent tracheostomy.  After removal of her tracheostomy the patient has had persistent hoarseness.  She has had studies as noted below she has mild postinflammatory pulmonary fibrosis.  Her PFTs were mostly restrictive due to postinflammatory pulmonary fibrosis and obesity.  She has been using Stiolto which helps with cough and somewhat with shortness of breath.  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  The right true cord was normal.  Per the patient ENT was to refer her for "voice exercises" but she never got this referral.  It appears that when she exerts herself her main issue is shortness of breath on inspiration (it is hard to get a breath in).  This raises the possibility that she may have some exercise-induced laryngeal obstruction from her prior issues during hospitalization for COVID.  As of today she still has not undergone speech eval. during her last visit of 03 May 2022 she was also referred to pulmonary rehab but has not enrolled.  I referred her to Dr. Beverely Risen at [facility] but again this has not occurred.   She has not gotten auto CPAP prescribed for her sleep apnea because of issues with payment by her insurance.  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  We placed another order for her equipment.  Overall she feels that she is stable.  She does not endorse any new symptom from  prior.  DATA: 09/07/2021  PFTs: FEV1 2.28 L or 86% predicted, FVC 2.57 L or 75% predicted, FEV1/FVC 89%, no bronchodilator response.  There is mild restrictive physiology due to ILD and/or obesity (ERV 24%) diffusion capacity was normal. 09/21/2021 CT chest high-resolution: Possible early interstitial lung disease areas of architectural distortion and groundglass attenuation likely representing infectious or inflammatory scarring.  Mild bronchiectasis. Evidence of three-vessel coronary artery disease.  Severe hepatic steatosis. 10/07/2021 2D echo: LVEF 60 to 65%, mild LVH.  Grade 1 DD.  Mild mitral regurg. 11/01/2021 Myoview: Normal stress test. 11/27/2021 Home sleep study: Mild obstructive sleep apnea with AHI of 9 and SPO2 low of 84%.  Review of Systems A 10 point review of systems was performed and it is as noted above otherwise negative.  Patient Active Problem List   Diagnosis Date Noted   Elevated serum creatinine 08/16/2021   Morbid obesity ([place]) 08/16/2021   Positive ANA (antinuclear antibody) 07/26/2021   Inflammatory arthritis 07/21/2021   Screening for heart disease 07/21/2021   COVID-19 07/21/2021   History of pulmonary embolism 07/21/2021   Anxiety 07/21/2021   Acute pulmonary embolism without acute cor pulmonale ([place]) 04/07/2021   Elevated liver enzymes 01/28/2021   Hyperlipidemia 01/21/2021   Chronic neck and back pain 03/24/2020   Essential (primary) hypertension 03/13/2020   Major depressive disorder 03/13/2020   Chronic ethmoidal sinusitis 04/02/2019   Chronic frontal sinusitis 04/02/2019   Chronic maxillary sinusitis 04/02/2019   Nasal  polyp 04/02/2019   Thyroid disease 06/19/2018       Objective:   Physical Exam BP 130/80 (BP Location: Left Arm, Cuff Size: Large)   Pulse 83   Temp 97.9 F (36.6 C) (Temporal)   Ht 5' 4"$  (1.626 m)   Wt 256 lb 8 oz (116.3 kg)   SpO2 96%   BMI 44.03 kg/m  GENERAL: Obese woman, no acute distress, fully ambulatory, raspy  voice, no conversational dyspnea. HEAD: Normocephalic, atraumatic.  EYES: Pupils equal, round, reactive to light.  No scleral icterus.  MOUTH: Nose/mouth/throat not examined due to masking requirements for COVID 19. NECK: Supple. No thyromegaly. Trachea midline. No JVD.  No adenopathy.  Well-healed tracheostomy scar. PULMONARY: Good air entry bilaterally.  No adventitious sounds. CARDIOVASCULAR: S1 and S2. Regular rate and rhythm.  No rubs, murmurs or gallops heard. ABDOMEN: Obese, well-healed PEG scar. MUSCULOSKELETAL: No joint deformity, no clubbing, no edema.  NEUROLOGIC: No focal deficit, no gait disturbance, speech is fluent. SKIN: Intact,warm,dry.  Mild discoloration of the toes distally, no frank ischemia.  Regrowing toenails. PSYCH: Mood and behavior normal     Assessment & Plan:     ICD-10-CM   1. Postinflammatory pulmonary fibrosis (HCC)  J84.10 Pulse oximetry, overnight   Post COVID-19 Continue to follow expectantly Would benefit from her rehab    2. Exercise induced laryngeal obstruction (EILO) -rule out  J38.6    Given her upper airway issues high possibility Would need evaluation at specialized center Referred to [facility] previously    3. OSA (obstructive sleep apnea)  G47.33 AMB REFERRAL FOR DME   Ordered CPAP again    4. Shortness of breath  R06.02    Notes some improvement with Stiolto Provided her with samples     Orders Placed This Encounter  Procedures   AMB REFERRAL FOR DME    Referral Priority:   Routine    Referral Type:   Durable Medical Equipment Purchase    Number of Visits Requested:   1   Pulse oximetry, overnight    On cpap once setup.  [id number]    Standing Status:   Future    Standing Expiration Date:   08/04/2023   Meds ordered this encounter  Medications   Tiotropium Bromide-Olodaterol (STIOLTO RESPIMAT) 2.5-2.5 MCG/ACT AERS    Sig: Inhale 2 puffs into the lungs daily.    Dispense:  4 g    Refill:  0    Order Specific Question:   Lot  Number?    Answer:   [id number]    Order Specific Question:   Expiration Date?    Answer:   05/26/2024    Order Specific Question:   Quantity    Answer:   2   Will see the patient in 3 months time she is to call sooner should any new problems arise.  Renold Don, MD Advanced Bronchoscopy PCCM [facility]    *This note was dictated using voice recognition software/Dragon.  Despite best efforts to proofread, errors can occur which can change the meaning. Any transcriptional errors that result from this process are unintentional and may not be fully corrected at the time of dictation.

## 2022-08-03 NOTE — Patient Instructions (Signed)
We have set up for a CPAP.  We also have requested you get a nasal mask of your choice.  I think that this will help you with some of your issues.  We will get an oxygen level measured on the CPAP without the oxygen to see if you still need oxygen.  It may be that with the CPAP you do not need the oxygen at nighttime.  I have provided you with more samples of the Stiolto, this is so that you can use it every day at least for the next 2 weeks to see how you do with regards to your breathing on the medication taken every day.  We will see you in follow-up in 3 months time call sooner should any new problems arise.

## 2022-08-10 DIAGNOSIS — N1832 Chronic kidney disease, stage 3b: Secondary | ICD-10-CM | POA: Diagnosis not present

## 2022-08-10 DIAGNOSIS — I10 Essential (primary) hypertension: Secondary | ICD-10-CM | POA: Diagnosis not present

## 2022-08-10 DIAGNOSIS — E039 Hypothyroidism, unspecified: Secondary | ICD-10-CM | POA: Diagnosis not present

## 2022-08-10 DIAGNOSIS — N1831 Chronic kidney disease, stage 3a: Secondary | ICD-10-CM | POA: Diagnosis not present

## 2022-08-26 ENCOUNTER — Ambulatory Visit: Payer: 59 | Admitting: Internal Medicine

## 2022-08-30 ENCOUNTER — Ambulatory Visit: Payer: 59 | Attending: Physician Assistant | Admitting: Physician Assistant

## 2022-08-30 ENCOUNTER — Encounter: Payer: Self-pay | Admitting: Physician Assistant

## 2022-08-30 VITALS — BP 124/78 | HR 59 | Ht 64.0 in | Wt 255.4 lb

## 2022-08-30 DIAGNOSIS — I10 Essential (primary) hypertension: Secondary | ICD-10-CM | POA: Diagnosis not present

## 2022-08-30 DIAGNOSIS — J8410 Pulmonary fibrosis, unspecified: Secondary | ICD-10-CM | POA: Diagnosis not present

## 2022-08-30 DIAGNOSIS — G4733 Obstructive sleep apnea (adult) (pediatric): Secondary | ICD-10-CM | POA: Diagnosis not present

## 2022-08-30 DIAGNOSIS — R0602 Shortness of breath: Secondary | ICD-10-CM

## 2022-08-30 DIAGNOSIS — E785 Hyperlipidemia, unspecified: Secondary | ICD-10-CM

## 2022-08-30 DIAGNOSIS — I2510 Atherosclerotic heart disease of native coronary artery without angina pectoris: Secondary | ICD-10-CM | POA: Diagnosis not present

## 2022-08-30 NOTE — Progress Notes (Signed)
Cardiology Office Note    Date:  08/30/2022   ID:  Bianca Michael, DOB 1966/12/17, MRN [id number]  PCP:  Mick Sell, MD  Cardiologist:  Debbe Odea, MD  Electrophysiologist:  None   Chief Complaint: Follow-up  History of Present Illness:   Bianca Michael is a 56 y.o. 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  She was admitted to the hospital in 12/2020 with COVID with multiple complications including acute hypoxic respiratory failure requiring intubation, ARF requiring temporary dialysis, right ventricular thrombus, PE, and secondary spontaneous pneumothorax treated with chest tube.  Echo during the admission showed preserved biventricular function.  PFTs in 08/2021 showed mild to moderate restrictive lung disease.  Echo in 09/2021 demonstrated an EF of 60 to 65%, no regional wall motion abnormalities, mild LVH, grade 1 diastolic dysfunction, normal RV systolic function, ventricular cavity size, and PASP, mild mitral regurgitation, and an estimated right atrial pressure of 3 mmHg.  She was initially evaluated by Dr. Azucena Cecil in 09/2021 and started on clopidogrel at that time.  Subsequent Lexiscan MPI in 10/2021 showing no evidence of ischemia or infarction and was found to be overall low risk.  CT attenuated corrected images showed mild aortic atherosclerosis and coronary artery calcifications.  She was last seen in our office in 12/2021 and was without symptoms of angina or decompensation.  She comes in and is doing well from a cardiac perspective, without symptoms of angina or decompensation.  Her chronic dyspnea is stable.   She has noted myalgias/arthralgias since being on atorvastatin.  With this, she has not been as active due to discomfort.  She also notes some positional dizziness following the initiation of losartan to Lopressor.   Labs independently reviewed: 07/2022 - TSH normal, potassium 4.2, BUN 15, serum creatinine 1.3 02/2022 - Hgb 14.8, PLT 193, albumin 4.4, AST/ALT normal, TC 192, TG 273, HDL 51, LDL 95  Past Medical History:  Diagnosis Date   Anxiety    Chronic kidney disease    COVID-19 12/2020   Patient was hospitalized   Depression    Hyperlipidemia    Hypertension    Inflammatory arthritis    Pneumothorax    Left lung   Pulmonary embolism (HCC)    Thyroid disease     Past Surgical History:  Procedure Laterality Date   CARPAL TUNNEL RELEASE     CHOLECYSTECTOMY     TONSILLECTOMY  1970   TUBAL LIGATION      Current Medications: Current Meds  Medication Sig   albuterol (VENTOLIN HFA) 108 (90 Base) MCG/ACT inhaler Inhale into the lungs.   Ascorbic Acid (VITAMIN C) 1000 MG tablet Take 1,000 mg by mouth daily.   buPROPion HCl ER, XL, 450 MG TB24 Take by mouth.   Cholecalciferol (VITAMIN D3 ULTRA STRENGTH PO) Take 5,000 Int'l Units/day by mouth.   clopidogrel (PLAVIX) 75 MG tablet Take 1 tablet (75 mg total) by mouth daily.   hydroxychloroquine (PLAQUENIL) 200 MG tablet TAKE 2 TABLETS BY MOUTH EVERY DAY   levothyroxine (SYNTHROID) 112 MCG tablet Take 1 tablet (112 mcg total) by mouth daily. needs appointment and labs for further refills   losartan (COZAAR) 25 MG tablet Take 25 mg by mouth daily.   magnesium  oxide (MAG-OX) 400 MG tablet Take by mouth.   Multiple Vitamins-Minerals (WOMENS MULTI) CAPS Take 1 capsule by mouth daily.   naltrexone (DEPADE) 50 MG tablet Take 50 mg by mouth daily.   omeprazole (PRILOSEC) 40 MG capsule TAKE 1 CAPSULE (40 MG TOTAL) BY MOUTH DAILY.   Tiotropium Bromide-Olodaterol (STIOLTO RESPIMAT) 2.5-2.5 MCG/ACT AERS Inhale 2 puffs into the lungs daily.    zinc gluconate 50 MG tablet Take 50 mg by mouth daily.   [DISCONTINUED] atorvastatin (LIPITOR) 80 MG tablet Take 1 tablet (80 mg total) by mouth daily.   [DISCONTINUED] metoprolol tartrate (LOPRESSOR) 25 MG tablet TAKE 1 TABLET BY MOUTH TWICE A DAY    Allergies:   Patient has no known allergies.   Social History   Socioeconomic History   Marital status: Married    Spouse name: Not on file   Number of children: Not on file   Years of education: Not on file   Highest education level: Not on file  Occupational History   Not on file  Tobacco Use   Smoking status: Former    Packs/day: 1.00    Years: 10.00    Total pack years: 10.00    Types: Cigarettes    Quit date: 12/26/2000    Years since quitting: 21.6   Smokeless tobacco: Never  Vaping Use   Vaping Use: Never used  Substance and Sexual Activity   Alcohol use: Yes    Comment: very rarely   Drug use: Not Currently   Sexual activity: Not Currently  Other Topics Concern   Not on file  Social History Narrative   Not on file   Social Determinants of Health   Financial Resource Strain: Not on file  Food Insecurity: Not on file  Transportation Needs: Not on file  Physical Activity: Not on file  Stress: Not on file  Social Connections: Not on file     Family History:  The patient's family history includes Breast cancer in her sister; Breast cancer (age of onset: 53) in her mother; Colon cancer in her mother; Heart attack in her father; Heart disease in her father; High Cholesterol in her father; Lupus in her sister; Rheum arthritis in her sister; Thyroid disease in her daughter, sister, sister, and sister.  ROS:   12-point review of systems is negative unless otherwise noted in HPI.   EKGs/Labs/Other Studies Reviewed:    Studies reviewed were summarized above. The additional studies were reviewed today:  Lexiscan MPI 11/01/2021:   The study is normal. The study is low risk.   No ST deviation was noted.   LV perfusion  is normal. There is no evidence of ischemia. There is no evidence of infarction.   Left ventricular function is normal. End diastolic cavity size is normal. End systolic cavity size is normal.   CT attenuation images showed mild aortic and coronary calcifications. __________  2D echo 10/07/2021: 1. Left ventricular ejection fraction, by estimation, is 60 to 65%. The  left ventricle has normal function. The left ventricle has no regional  wall motion abnormalities. There is mild left ventricular hypertrophy.  Left ventricular diastolic parameters  are consistent with Grade I diastolic dysfunction (impaired relaxation).   2. Right ventricular systolic function is normal. The right ventricular  size is normal. There is normal pulmonary artery systolic pressure. The  estimated right ventricular systolic pressure is 22.6 mmHg.   3. The mitral valve is normal in structure. Mild mitral valve  regurgitation. No evidence of mitral stenosis.  4. The aortic valve is normal in structure. Aortic valve regurgitation is  not visualized. No aortic stenosis is present.   5. The inferior vena cava is normal in size with greater than 50%  respiratory variability, suggesting right atrial pressure of 3 mmHg.   EKG:  EKG is ordered today.  The EKG ordered today demonstrates sinus bradycardia, 59 bpm, poor R wave progression along the precordial leads, nonspecific ST-T changes  Recent Labs: 02/23/2022: ALT 31; BUN 12; Creatinine, Ser 1.25; Hemoglobin 14.8; Platelets 193; Potassium 4.6; Sodium 141; TSH 2.400  Recent Lipid Panel    Component Value Date/Time   CHOL 192 02/23/2022 0913   TRIG 273 (H) 02/23/2022 0913   HDL 51 02/23/2022 0913   CHOLHDL 3.8 02/23/2022 0913   LDLCALC 95 02/23/2022 0913    PHYSICAL EXAM:    VS:  BP 124/78 (BP Location: Left Arm, Patient Position: Sitting, Cuff Size: Large)   Pulse (!) 59   Ht 5\' 4"  (1.626 m)   Wt 255 lb 7 oz (115.9 kg)   SpO2 98%   BMI 43.85 kg/m   BMI:  Body mass index is 43.85 kg/m.  Physical Exam Constitutional:      Appearance: She is well-developed.  HENT:     Head: Normocephalic and atraumatic.  Eyes:     General:        Right eye: No discharge.        Left eye: No discharge.  Neck:     Vascular: No JVD.  Cardiovascular:     Rate and Rhythm: Normal rate and regular rhythm.     Pulses:          Dorsalis pedis pulses are 2+ on the right side and 2+ on the left side.       Posterior tibial pulses are 2+ on the right side and 2+ on the left side.     Heart sounds: Normal heart sounds, S1 normal and S2 normal. Heart sounds not distant. No midsystolic click and no opening snap. No murmur heard.    No friction rub.  Pulmonary:     Effort: Pulmonary effort is normal. No respiratory distress.     Breath sounds: Normal breath sounds. No decreased breath sounds, wheezing or rales.  Chest:     Chest wall: No tenderness.  Abdominal:     General: There is no distension.  Musculoskeletal:     Cervical back: Normal range of motion.     Right lower leg: No edema.     Left lower leg: No edema.  Skin:    General: Skin is warm and dry.     Nails: There is no clubbing.  Neurological:     Mental Status: She is alert and oriented to person, place, and time.  Psychiatric:        Speech: Speech normal.        Behavior: Behavior normal.        Thought Content: Thought content normal.        Judgment: Judgment normal.     Wt Readings from Last 3 Encounters:  08/30/22 255 lb 7 oz (115.9 kg)  08/03/22 256 lb 8 oz (116.3 kg)  05/03/22 257 lb (116.6 kg)     ASSESSMENT & PLAN:   CAD involving the native coronary arteries without angina: She is doing well and is without symptoms of angina or decompensation.  Continue aggressive risk factor modification clopidogrel as directed by primary cardiologist and losartan.  We are holding atorvastatin  secondary to myalgias/arthralgias as outlined below and metoprolol secondary to positional  dizziness.  No indication for further ischemic testing at this time.  HTN: Blood pressure is well controlled in the office today.  We will discontinue Lopressor due to positional dizziness.  She remains on losartan which was started by nephrology for renal protection.  HLD: LDL 95 in 02/2022.  She has noted myalgias/arthralgias following the initiation of atorvastatin.  We will discontinue this medication at this time.  When we see her back in follow-up we will discuss trial of ezetimibe versus rosuvastatin.  May need to consider PCSK9 inhibitor or bempedoic acid.  Dyspnea: Chronic and stable, dates back to COVID illness.  Cardiac work-up reassuring.  Follow-up with pulmonology as directed.   Disposition: F/u with Dr. Azucena Cecil or an APP in 2 months.   Medication Adjustments/Labs and Tests Ordered: Current medicines are reviewed at length with the patient today.  Concerns regarding medicines are outlined above. Medication changes, Labs and Tests ordered today are summarized above and listed in the Patient Instructions accessible in Encounters.   Signed, Eula Listen, PA-C 08/30/2022 11:18 AM     [facility] [address] [place], [place] [address] [phone]

## 2022-08-30 NOTE — Patient Instructions (Addendum)
Medication Instructions:  Your physician has recommended you make the following change in your medication:   STOP atorvastatin (Lipitor) STOP Metoprolol   *If you need a refill on your cardiac medications before your next appointment, please call your pharmacy*   Lab Work: None  If you have labs (blood work) drawn today and your tests are completely normal, you will receive your results only by: MyChart Message (if you have MyChart) OR A paper copy in the mail If you have any lab test that is abnormal or we need to change your treatment, we will call you to review the results.   Testing/Procedures: None   Follow-Up: At [facility], you and your health needs are our priority.  As part of our continuing mission to provide you with exceptional heart care, we have created designated Provider Care Teams.  These Care Teams include your primary Cardiologist (physician) and Advanced Practice Providers (APPs -  Physician Assistants and Nurse Practitioners) who all work together to provide you with the care you need, when you need it.   Your next appointment:   2 month(s)  The format for your next appointment:   In Person  Provider:   Debbe Odea, MD or Eula Listen, PA-C       Important Information About Sugar

## 2022-09-14 ENCOUNTER — Other Ambulatory Visit: Payer: Self-pay | Admitting: *Deleted

## 2022-09-14 MED ORDER — AMLODIPINE BESYLATE 5 MG PO TABS: 5.0000 mg | ORAL_TABLET | Freq: Every day | ORAL | 1 refills | Status: DC

## 2022-09-14 NOTE — Progress Notes (Signed)
Amlodipine 5 mg QD

## 2022-09-20 DIAGNOSIS — G4733 Obstructive sleep apnea (adult) (pediatric): Secondary | ICD-10-CM | POA: Diagnosis not present

## 2022-09-20 DIAGNOSIS — R490 Dysphonia: Secondary | ICD-10-CM | POA: Diagnosis not present

## 2022-09-20 DIAGNOSIS — K219 Gastro-esophageal reflux disease without esophagitis: Secondary | ICD-10-CM | POA: Diagnosis not present

## 2022-09-26 DIAGNOSIS — R1084 Generalized abdominal pain: Secondary | ICD-10-CM | POA: Diagnosis not present

## 2022-09-26 DIAGNOSIS — I10 Essential (primary) hypertension: Secondary | ICD-10-CM | POA: Diagnosis not present

## 2022-09-26 DIAGNOSIS — E785 Hyperlipidemia, unspecified: Secondary | ICD-10-CM | POA: Diagnosis not present

## 2022-09-26 DIAGNOSIS — Z86711 Personal history of pulmonary embolism: Secondary | ICD-10-CM | POA: Diagnosis not present

## 2022-09-29 DIAGNOSIS — J8410 Pulmonary fibrosis, unspecified: Secondary | ICD-10-CM | POA: Diagnosis not present

## 2022-10-06 NOTE — Progress Notes (Deleted)
Office Visit Note  Patient: Bianca Michael             Date of Birth: June 06, 1966           MRN: [id number]             PCP: Mick Sell, MD Referring: Mick Sell, MD Visit Date: 10/12/2022   Subjective:  No chief complaint on file.   History of Present Illness: Bianca Michael is a 56 y.o. female here for follow up for seronegative RA    Previous HPI 04/11/2022 Bianca Michael is a 56 y.o. 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 Overall has been doing pretty well but for past about 3 weeks has increased pain and stiffness in right hand and left elbow. Unable to fully extend and flex her left 3rd finger with some triggering. Left elbow feels okay in flexed position some more pain when extending and with rotational movements. She is taking tylenol as needed in addition to her hydroxychloroquine which helps partially.    Previous HPI 10/11/21 Bianca Michael is a 56 y.o. female here for follow up for seronegative arthritis after starting hydroxychloroquine 400 mg PO daily. At previous visit objective inflammation at the wrist suggestive for dequervains but also polyarthralgia and fatigue. Hand and wrist pain improved a lot now able to make tight fists, but has had some increase overall she associates with timing of weather changes. She sustained a fall after walking for a long time at the mall with no good location to sit for a break and struck the right side of her head. Currently bilateral shoulder pain is worse, especially on left. This has woken her from sleep a few times. No numbness down the arms.     Previous HPI: 07/26/21 Bianca Michael is a 56 y.o. female here for evaluation of positive ANA with recent symptoms of joint pains, pulmonary embolus on xarelto, and peripheral ischemia in toes transiently attributed to vasopressor treatment with loss of toenails. She has a history of hashimoto's thyroiditis versus Graves' disease  has been treated with both methimazole and levothyroxine. Overall she has been feeling in about usual health till becoming acutely ill earlier this year.  She does recall some bronchitis type symptoms preceding the January COVID illness and hospitalization.  She does not recall much from that stay since she experienced significant portion of 2 months unconscious in ICU requiring tracheostomy for ventilator support after pulmonary embolus and collapsed lung.  After initially in the hospital she required extensive physical therapy with inability to walk or even transfer independently.  She does feel she has been more reliant on her upper extremity mobility during the initial period due to severe leg weakness. Currently her worst affected area is the right wrist with ongoing pain and swelling and decreased ability to flex and extend without pain. This has been persistently swollen for weeks, the left wrist hurts just intermittently and without visible swelling. She does have family history with first-degree relative and carrying diagnosis of lupus who has been treated with Plaquenil and prednisone.   Labs reviewed 06/2021 ANA pos dsDNA 13 RNP, Smith, SSA, SSB, chromatin, Jo-1, centromere neg RF 11.3 Uric acid 7.1 B2GP1 IgA/IgM/IgG neg   03/2021 B2GP1 IgG 60 ACA neg LA neg   Imaging reviewed 07/21/21 Xray right knee 4 views Mild osteoarthritis appears most advanced in the medial compartments. 07/21/21 Xray left knee 4 views Mild osteoarthritis appears most  advanced in the medial compartments. 07/21/21 Xray right wrist No acute finding. Mild joint space narrowing at the radiocarpal joint in the region of the radial styloid and navicular consistent with mild osteoarthritis. 07/21/21 Xray chest 2 views Mild bronchial thickening. Streaky opacity at the left lung base, may be atelectasis or scarring. No confluent consolidation.   No Rheumatology ROS completed.   PMFS History:  Patient Active  Problem List   Diagnosis Date Noted   Elevated serum creatinine 08/16/2021   Morbid obesity ([place]) 08/16/2021   Positive ANA (antinuclear antibody) 07/26/2021   Inflammatory arthritis 07/21/2021   Screening for heart disease 07/21/2021   COVID-19 07/21/2021   History of pulmonary embolism 07/21/2021   Anxiety 07/21/2021   Acute pulmonary embolism without acute cor pulmonale ([place]) 04/07/2021   Elevated liver enzymes 01/28/2021   Hyperlipidemia 01/21/2021   Chronic neck and back pain 03/24/2020   Essential (primary) hypertension 03/13/2020   Major depressive disorder 03/13/2020   Chronic ethmoidal sinusitis 04/02/2019   Chronic frontal sinusitis 04/02/2019   Chronic maxillary sinusitis 04/02/2019   Nasal polyp 04/02/2019   Thyroid disease 06/19/2018    Past Medical History:  Diagnosis Date   Anxiety    Chronic kidney disease    COVID-19 12/2020   Patient was hospitalized   Depression    Hyperlipidemia    Hypertension    Inflammatory arthritis    Pneumothorax    Left lung   Pulmonary embolism ([place])    Thyroid disease     Family History  Problem Relation Age of Onset   Breast cancer Mother 77   Colon cancer Mother    Heart attack Father    Heart disease Father    High Cholesterol Father    Thyroid disease Sister    Breast cancer Sister    Thyroid disease Sister    Thyroid disease Sister    Lupus Sister    Rheum arthritis Sister    Thyroid disease Daughter    Past Surgical History:  Procedure Laterality Date   CARPAL TUNNEL RELEASE     CHOLECYSTECTOMY     TONSILLECTOMY  1970   TUBAL LIGATION     Social History   Social History Narrative   Not on file   Immunization History  Administered Date(s) Administered   Hep A / Hep B 12/14/2021, 01/31/2022     Objective: Vital Signs: There were no vitals taken for this visit.   Physical Exam   Musculoskeletal Exam: ***  CDAI Exam: CDAI Score: -- Patient Global: --; Provider Global: -- Swollen: --; Tender:  -- Joint Exam 10/12/2022   No joint exam has been documented for this visit   There is currently no information documented on the homunculus. Go to the Rheumatology activity and complete the homunculus joint exam.  Investigation: No additional findings.  Imaging: No results found.  Recent Labs: Lab Results  Component Value Date   WBC 6.0 02/23/2022   HGB 14.8 02/23/2022   PLT 193 02/23/2022   NA 141 02/23/2022   K 4.6 02/23/2022   CL 103 02/23/2022   CO2 25 02/23/2022   GLUCOSE 108 (H) 02/23/2022   BUN 12 02/23/2022   CREATININE 1.25 (H) 02/23/2022   BILITOT 0.3 02/23/2022   ALKPHOS 106 02/23/2022   AST 21 02/23/2022   ALT 31 02/23/2022   PROT 7.1 02/23/2022   ALBUMIN 4.4 02/23/2022   CALCIUM 9.9 02/23/2022    Speciality Comments: PLQ Eye Exam 08/27/2021 [facility]  Procedures:  No procedures performed Allergies: Patient has no known allergies.   Assessment / Plan:     Visit Diagnoses: No diagnosis found.  ***  Orders: No orders of the defined types were placed in this encounter.  No orders of the defined types were placed in this encounter.    Follow-Up Instructions: No follow-ups on file.   Bianca Michael, RT  Note - This record has been created using Animal nutritionist.  Chart creation errors have been sought, but may not always  have been located. Such creation errors do not reflect on  the standard of medical care.

## 2022-10-12 ENCOUNTER — Ambulatory Visit: Payer: Disability Insurance | Admitting: Internal Medicine

## 2022-10-12 ENCOUNTER — Other Ambulatory Visit: Payer: Self-pay | Admitting: *Deleted

## 2022-10-12 DIAGNOSIS — M1990 Unspecified osteoarthritis, unspecified site: Secondary | ICD-10-CM

## 2022-10-12 DIAGNOSIS — Z79899 Other long term (current) drug therapy: Secondary | ICD-10-CM

## 2022-10-12 DIAGNOSIS — R768 Other specified abnormal immunological findings in serum: Secondary | ICD-10-CM

## 2022-10-12 MED ORDER — CLOPIDOGREL BISULFATE 75 MG PO TABS: 75.0000 mg | ORAL_TABLET | Freq: Every day | ORAL | 0 refills | Status: DC

## 2022-10-14 NOTE — Progress Notes (Signed)
Office Visit Note  Patient: Bianca Michael             Date of Birth: 24-Nov-1966           MRN: [id number]             PCP: Mick Sell, MD Referring: Mick Sell, MD Visit Date: 10/17/2022   Subjective:  Follow-up (Feeling about the same as last visit. Neck, right hip, and lower back pain. )   History of Present Illness: Bianca Michael is a 56 y.o. 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  Symptoms have been pretty stable since her last visit still feels like she is seeing an improvement in peripheral joint pain and stiffness.  Currently biggest issues in the neck low back and hip areas.  These have never shown significant improvement so far since starting hydroxychloroquine.  Previous HPI 04/11/2022 Bianca Michael is a 56 y.o. 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 Overall has been doing pretty well but for past about 3 weeks has increased pain and stiffness in right hand and left elbow. Unable to fully extend and flex her left 3rd finger with some triggering. Left elbow feels okay in flexed position some more pain when extending and with rotational movements. She is taking tylenol as needed in addition to her hydroxychloroquine which helps partially.    Previous HPI 10/11/21 Bianca Michael is a 56 y.o. female here for follow up for seronegative arthritis after starting hydroxychloroquine 400 mg PO daily. At previous visit objective inflammation at the wrist suggestive for dequervains but also polyarthralgia and fatigue. Hand and wrist pain improved a lot now able to make tight fists, but has had some increase overall she associates with timing of weather changes. She sustained a fall after walking for a long time at the mall with no good location to sit for a break and struck the right side of her head. Currently bilateral shoulder pain is worse, especially on left. This has woken her from  sleep a few times. No numbness down the arms.     Previous HPI: 07/26/21 Bianca Michael is a 56 y.o. female here for evaluation of positive ANA with recent symptoms of joint pains, pulmonary embolus on xarelto, and peripheral ischemia in toes transiently attributed to vasopressor treatment with loss of toenails. 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 Overall she has been feeling in about usual health till becoming acutely ill earlier this year.  She does recall some bronchitis type symptoms preceding the January COVID illness and hospitalization.  She does not recall much from that stay since she experienced significant portion of 2 months unconscious in ICU requiring tracheostomy for ventilator support after pulmonary embolus and collapsed lung.  After initially in the hospital she required extensive physical therapy with inability to walk or even transfer independently.  She does feel she has been more reliant on her upper extremity mobility during the initial period due to severe leg weakness. Currently her worst affected area is the right wrist with ongoing pain and swelling and decreased ability to flex and extend without pain. This has been persistently swollen for weeks, the left wrist hurts just intermittently and without visible swelling. She does have family history with first-degree relative and carrying diagnosis of lupus who has been treated with Plaquenil and prednisone.  Labs reviewed 06/2021 ANA pos dsDNA 13 RNP, Smith, SSA, SSB, chromatin, Jo-1, centromere neg RF 11.3 Uric acid 7.1 B2GP1 IgA/IgM/IgG neg   03/2021 B2GP1 IgG 60 ACA neg LA neg   Imaging reviewed 07/21/21 Xray right knee 4 views Mild osteoarthritis appears most advanced in the medial compartments. 07/21/21 Xray left knee 4 views Mild osteoarthritis appears most advanced in the medial compartments. 07/21/21 Xray right  wrist No acute finding. Mild joint space narrowing at the radiocarpal joint in the region of the radial styloid and navicular consistent with mild osteoarthritis. 07/21/21 Xray chest 2 views Mild bronchial thickening. Streaky opacity at the left lung base, may be atelectasis or scarring. No confluent consolidation.   Review of Systems  Constitutional:  Positive for fatigue.  HENT:  Positive for mouth dryness. Negative for mouth sores.   Eyes:  Positive for dryness.  Respiratory:  Positive for shortness of breath.   Cardiovascular:  Positive for palpitations. Negative for chest pain.  Gastrointestinal:  Positive for constipation and diarrhea. Negative for blood in stool.  Endocrine: Negative for increased urination.  Genitourinary:  Positive for involuntary urination.  Musculoskeletal:  Positive for joint pain, gait problem, joint pain, joint swelling, myalgias, muscle weakness, morning stiffness, muscle tenderness and myalgias.  Skin:  Positive for rash and sensitivity to sunlight. Negative for color change.  Allergic/Immunologic: Negative for susceptible to infections.  Neurological:  Positive for dizziness and headaches.  Hematological:  Negative for swollen glands.  Psychiatric/Behavioral:  Positive for depressed mood and sleep disturbance. The patient is nervous/anxious.     PMFS History:  Patient Active Problem List   Diagnosis Date Noted   Iron deficiency 10/17/2022   Elevated serum creatinine 08/16/2021   Morbid obesity ([place]) 08/16/2021   Positive ANA (antinuclear antibody) 07/26/2021   Inflammatory arthritis 07/21/2021   Screening for heart disease 07/21/2021   COVID-19 07/21/2021   History of pulmonary embolism 07/21/2021   Anxiety 07/21/2021   Acute pulmonary embolism without acute cor pulmonale ([place]) 04/07/2021   Elevated liver enzymes 01/28/2021   Hyperlipidemia 01/21/2021   Chronic neck and back pain 03/24/2020   Essential (primary) hypertension 03/13/2020   Major  depressive disorder 03/13/2020   Chronic ethmoidal sinusitis 04/02/2019   Chronic frontal sinusitis 04/02/2019   Chronic maxillary sinusitis 04/02/2019   Nasal polyp 04/02/2019   Thyroid disease 06/19/2018    Past Medical History:  Diagnosis Date   Anxiety    Chronic kidney disease    COVID-19 12/2020   Patient was hospitalized   Depression    Hyperlipidemia    Hypertension    Inflammatory arthritis    Pneumothorax    Left lung   Pulmonary embolism ([place])    Thyroid disease     Family History  Problem Relation Age of Onset   Breast cancer Mother 28   Colon cancer Mother    Heart attack Father    Heart disease Father    High Cholesterol Father    Thyroid disease Sister    Breast cancer Sister    Thyroid disease Sister    Thyroid disease Sister    Lupus Sister    Rheum arthritis Sister    Thyroid disease Daughter    Past Surgical History:  Procedure Laterality Date   CARPAL TUNNEL RELEASE     CHOLECYSTECTOMY     TONSILLECTOMY  1970   TUBAL LIGATION     Social History   Social History Narrative   Not on file   Immunization History  Administered  Date(s) Administered   Hep A / Hep B 12/14/2021, 01/31/2022     Objective: Vital Signs: BP 119/82 (BP Location: Left Arm, Patient Position: Sitting, Cuff Size: Large)   Pulse 74   Resp 16   Ht 5\' 4"  (1.626 m)   Wt 254 lb (115.2 kg)   BMI 43.60 kg/m    Physical Exam Constitutional:      Appearance: She is obese.  Cardiovascular:     Rate and Rhythm: Normal rate and regular rhythm.  Pulmonary:     Effort: Pulmonary effort is normal.     Breath sounds: Normal breath sounds.  Skin:    General: Skin is warm and dry.     Findings: No rash.  Neurological:     Mental Status: She is alert.  Psychiatric:        Mood and Affect: Mood normal.      Musculoskeletal Exam:  Neck full ROM tightness with lateral rotation both directions Shoulders full ROM, guarding against overhead abduction, pain anteriorly with  internal and external rotation while partially abducted Elbows full ROM no tenderness or swelling Wrists full ROM no tenderness or swelling Fingers full ROM no swelling or tenderness Knees full ROM no tenderness or swelling, patellofemoral crepitus b/l Ankles full ROM no tenderness or swelling  CDAI Exam: CDAI Score: 4  Patient Global: --; Provider Global: 10 mm Swollen: 0 ; Tender: 2  Joint Exam 10/17/2022      Right  Left  Glenohumeral   Tender     Hip   Tender        Investigation: No additional findings.  Imaging: No results found.  Recent Labs: Lab Results  Component Value Date   WBC 4.5 10/17/2022   HGB 15.1 10/17/2022   PLT 203 10/17/2022   NA 141 10/17/2022   K 4.5 10/17/2022   CL 104 10/17/2022   CO2 28 10/17/2022   GLUCOSE 85 10/17/2022   BUN 12 10/17/2022   CREATININE 1.24 (H) 10/17/2022   BILITOT 0.5 10/17/2022   ALKPHOS 106 02/23/2022   AST 29 10/17/2022   ALT 42 (H) 10/17/2022   PROT 7.4 10/17/2022   ALBUMIN 4.4 02/23/2022   CALCIUM 10.0 10/17/2022    Speciality Comments: PLQ Eye Exam 08/27/2021 [facility]  Procedures:  No procedures performed Allergies: Patient has no known allergies.   Assessment / Plan:     Visit Diagnoses: Seronegative rheumatoid arthritis- Plan: Sedimentation rate  Inflammatory disease symptoms appear well controlled today.  Checking sedimentation rate for disease activity monitoring.  Plan to continue hydroxychloroquine 40 mg p.o. daily.  High risk medication use - hydroxychloroquine 400 mg daily - Plan: CBC with Differential/Platelet, COMPLETE METABOLIC PANEL WITH GFR  Checking CBC and CMP for continued hydroxychloroquine medication management.  Most recent Plaquenil eye exam reviewed from September of last year we will need to have this updated going forwards.  Chronic neck and back pain - Plan: tiZANidine (ZANAFLEX) 4 MG tablet  Pain looks more consistent with related to some chronic degenerative disc  disease or paraspinal another back muscle tenderness.  She has had limited benefit with some muscle relaxant medications but will recommend trial of tizanidine 4 mg at night particular to help with the pain and sleep disruption.  Iron deficiency - Plan: Iron, TIBC and Ferritin Panel  Checking iron panel today to follow-up on her iron deficiency though probably not causing a clinically significant anemia so not sure if related to symptoms.    Orders: Orders  Placed This Encounter  Procedures   Sedimentation rate   CBC with Differential/Platelet   COMPLETE METABOLIC PANEL WITH GFR   Iron, TIBC and Ferritin Panel   Meds ordered this encounter  Medications   tiZANidine (ZANAFLEX) 4 MG tablet    Sig: Take 1 tablet (4 mg total) by mouth at bedtime.    Dispense:  30 tablet    Refill:  2   hydroxychloroquine (PLAQUENIL) 200 MG tablet    Sig: Take 2 tablets (400 mg total) by mouth daily.    Dispense:  180 tablet    Refill:  1     Follow-Up Instructions: Return in about 6 months (around 04/18/2023) for RA on HCQ f/u 12mos.   Fuller Plan, MD  Note - This record has been created using [employer].  Chart creation errors have been sought, but may not always  have been located. Such creation errors do not reflect on  the standard of medical care.

## 2022-10-17 ENCOUNTER — Encounter: Payer: Self-pay | Admitting: Internal Medicine

## 2022-10-17 ENCOUNTER — Ambulatory Visit: Payer: 59 | Attending: Internal Medicine | Admitting: Internal Medicine

## 2022-10-17 VITALS — BP 119/82 | HR 74 | Resp 16 | Ht 64.0 in | Wt 254.0 lb

## 2022-10-17 DIAGNOSIS — Z79899 Other long term (current) drug therapy: Secondary | ICD-10-CM | POA: Diagnosis not present

## 2022-10-17 DIAGNOSIS — G8929 Other chronic pain: Secondary | ICD-10-CM | POA: Diagnosis not present

## 2022-10-17 DIAGNOSIS — R768 Other specified abnormal immunological findings in serum: Secondary | ICD-10-CM | POA: Diagnosis not present

## 2022-10-17 DIAGNOSIS — M549 Dorsalgia, unspecified: Secondary | ICD-10-CM

## 2022-10-17 DIAGNOSIS — E611 Iron deficiency: Secondary | ICD-10-CM | POA: Diagnosis not present

## 2022-10-17 DIAGNOSIS — M1380 Other specified arthritis, unspecified site: Secondary | ICD-10-CM | POA: Diagnosis not present

## 2022-10-17 DIAGNOSIS — M1990 Unspecified osteoarthritis, unspecified site: Secondary | ICD-10-CM

## 2022-10-17 DIAGNOSIS — M542 Cervicalgia: Secondary | ICD-10-CM

## 2022-10-17 DIAGNOSIS — R7689 Other specified abnormal immunological findings in serum: Secondary | ICD-10-CM

## 2022-10-17 MED ORDER — TIZANIDINE HCL 4 MG PO TABS: 4.0000 mg | ORAL_TABLET | Freq: Every day | ORAL | 2 refills | Status: DC

## 2022-10-17 MED ORDER — HYDROXYCHLOROQUINE SULFATE 200 MG PO TABS: 400.0000 mg | ORAL_TABLET | Freq: Every day | ORAL | 1 refills | Status: DC

## 2022-10-18 LAB — IRON,TIBC AND FERRITIN PANEL
%SAT: 24 % (calc) (ref 16–45)
Ferritin: 91 ng/mL (ref 16–232)
Iron: 95 ug/dL (ref 45–160)
TIBC: 391 mcg/dL (calc) (ref 250–450)

## 2022-10-18 LAB — CBC WITH DIFFERENTIAL/PLATELET
Absolute Monocytes: 369 cells/uL (ref 200–950)
Basophils Absolute: 59 cells/uL (ref 0–200)
Basophils Relative: 1.3 %
Eosinophils Absolute: 158 cells/uL (ref 15–500)
Eosinophils Relative: 3.5 %
HCT: 44.8 % (ref 35.0–45.0)
Hemoglobin: 15.1 g/dL (ref 11.7–15.5)
Lymphs Abs: 1859 cells/uL (ref 850–3900)
MCH: 28.4 pg (ref 27.0–33.0)
MCHC: 33.7 g/dL (ref 32.0–36.0)
MCV: 84.2 fL (ref 80.0–100.0)
MPV: 10.3 fL (ref 7.5–12.5)
Monocytes Relative: 8.2 %
Neutro Abs: 2057 cells/uL (ref 1500–7800)
Neutrophils Relative %: 45.7 %
Platelets: 203 10*3/uL (ref 140–400)
RBC: 5.32 10*6/uL — ABNORMAL HIGH (ref 3.80–5.10)
RDW: 13.1 % (ref 11.0–15.0)
Total Lymphocyte: 41.3 %
WBC: 4.5 10*3/uL (ref 3.8–10.8)

## 2022-10-18 LAB — COMPLETE METABOLIC PANEL WITH GFR
AG Ratio: 1.6 (calc) (ref 1.0–2.5)
ALT: 42 U/L — ABNORMAL HIGH (ref 6–29)
AST: 29 U/L (ref 10–35)
Albumin: 4.5 g/dL (ref 3.6–5.1)
Alkaline phosphatase (APISO): 81 U/L (ref 37–153)
BUN/Creatinine Ratio: 10 (calc) (ref 6–22)
BUN: 12 mg/dL (ref 7–25)
CO2: 28 mmol/L (ref 20–32)
Calcium: 10 mg/dL (ref 8.6–10.4)
Chloride: 104 mmol/L (ref 98–110)
Creat: 1.24 mg/dL — ABNORMAL HIGH (ref 0.50–1.03)
Globulin: 2.9 g/dL (calc) (ref 1.9–3.7)
Glucose, Bld: 85 mg/dL (ref 65–99)
Potassium: 4.5 mmol/L (ref 3.5–5.3)
Sodium: 141 mmol/L (ref 135–146)
Total Bilirubin: 0.5 mg/dL (ref 0.2–1.2)
Total Protein: 7.4 g/dL (ref 6.1–8.1)
eGFR: 50 mL/min/{1.73_m2} — ABNORMAL LOW (ref >=60)

## 2022-10-18 LAB — SEDIMENTATION RATE: Sed Rate: 2 mm/h (ref 0–30)

## 2022-10-21 NOTE — Progress Notes (Signed)
Lab results look okay for continuing hydroxychloroquine. One liver function test the ALT is mildly elevated but barely so. Her sedimentation rate is normal. Iron levels are now normal as well.

## 2022-10-29 NOTE — Progress Notes (Unsigned)
Cardiology Office Note    Date:  11/03/2022   ID:  Bianca Michael, DOB 04/13/1966, MRN [id number]  PCP:  Leonel Ramsay, MD  Cardiologist:  Kate Sable, MD  Electrophysiologist:  None   Chief Complaint: Follow-up  History of Present Illness:   Bianca Michael is a 56 y.o. 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   She was admitted to the hospital in 12/2020 with COVID with multiple complications including acute hypoxic respiratory failure requiring intubation, ARF requiring temporary dialysis, right ventricular thrombus, PE, and secondary spontaneous pneumothorax treated with chest tube.  Echo during the admission showed preserved biventricular function.   PFTs in 08/2021 showed mild to moderate restrictive lung disease.  Echo in 09/2021 demonstrated an EF of 60 to 65%, no regional wall motion abnormalities, mild LVH, grade 1 diastolic dysfunction, normal RV systolic function, ventricular cavity size, and PASP, mild mitral regurgitation, and an estimated right atrial pressure of 3 mmHg.   She was initially evaluated by Dr. Garen Lah in 09/2021 and started on clopidogrel at that time for CAD.  Subsequent Lexiscan MPI in 10/2021 showed no evidence of ischemia or infarction and was found to be overall low risk.  CT attenuated corrected images showed mild aortic atherosclerosis and coronary artery calcifications.  She was last seen in the office in 08/2022 and was without symptoms of angina or decompensation with noted stable chronic dyspnea.  She did note myalgias/arthralgias following the initiation of atorvastatin.  With this, she had not been  as active due to discomfort.  In this setting, atorvastatin was discontinued.  She also noted some positional dizziness following the initiation of Lopressor.  Given dizziness, Lopressor was discontinued.  Since she was last seen she was started on amlodipine 5 mg due to elevated BP readings.  She comes in doing reasonably well from a cardiac perspective and is without symptoms of angina or decompensation.  Her chronic dyspnea is stable.  She does note an improvement in her myalgias/arthralgias since discontinuing atorvastatin, though not resolution.  She also notes some posterior neck discomfort as well as some dizziness if she rotates her head.  No syncope.  She does note some palpitations following the initiation of amlodipine.  Weight is stable.  Blood pressure well controlled.  Since she was last seen, she has started taking red yeast rice.   Labs independently reviewed: 09/2022 - BUN 12, serum creatinine 1.24, potassium 4.5, albumin 4.5, AST normal, ALT 42, Hgb 15.1, PLT 203 07/2022 -TSH normal 02/2022 - TC 192, TG 273, HDL 51, LDL 95   Past Medical History:  Diagnosis Date   Anxiety    Chronic kidney disease    COVID-19 12/2020   Patient was hospitalized   Depression    Hyperlipidemia    Hypertension    Inflammatory arthritis    Pneumothorax    Left lung   Pulmonary embolism ([facility])    Thyroid disease     Past Surgical History:  Procedure Laterality Date   CARPAL TUNNEL RELEASE     CHOLECYSTECTOMY     TONSILLECTOMY  1970   TUBAL LIGATION      Current Medications: Current Meds  Medication Sig   albuterol (VENTOLIN HFA) 108 (90 Base) MCG/ACT inhaler Inhale  into the lungs.   amLODipine (NORVASC) 5 MG tablet Take 1 tablet (5 mg total) by mouth daily.   Ascorbic Acid (VITAMIN C) 1000 MG tablet Take 1,000 mg by mouth daily.   buPROPion HCl ER, XL, 450 MG TB24 Take by mouth.   Cholecalciferol (VITAMIN D3 ULTRA STRENGTH PO) Take 5,000 Int'l Units/day by mouth.    hydroxychloroquine (PLAQUENIL) 200 MG tablet Take 2 tablets (400 mg total) by mouth daily.   levothyroxine (SYNTHROID) 112 MCG tablet Take 1 tablet (112 mcg total) by mouth daily. needs appointment and labs for further refills   losartan (COZAAR) 25 MG tablet Take 25 mg by mouth daily.   magnesium oxide (MAG-OX) 400 MG tablet Take by mouth.   MILK THISTLE PO Take 1 capsule by mouth daily.   Multiple Vitamins-Minerals (WOMENS MULTI) CAPS Take 1 capsule by mouth daily.   naltrexone (DEPADE) 50 MG tablet Take 50 mg by mouth daily.   naltrexone (DEPADE) 50 MG tablet daily.   omeprazole (PRILOSEC) 40 MG capsule TAKE 1 CAPSULE (40 MG TOTAL) BY MOUTH DAILY.   Red Yeast Rice Extract (RED YEAST RICE PO) Take 2 capsules by mouth daily.   Tiotropium Bromide-Olodaterol (STIOLTO RESPIMAT) 2.5-2.5 MCG/ACT AERS Inhale 2 puffs into the lungs daily.   tiZANidine (ZANAFLEX) 4 MG tablet Take 1 tablet (4 mg total) by mouth at bedtime.   zinc gluconate 50 MG tablet Take 50 mg by mouth daily.   [DISCONTINUED] clopidogrel (PLAVIX) 75 MG tablet Take 1 tablet (75 mg total) by mouth daily.    Allergies:   Patient has no known allergies.   Social History   Socioeconomic History   Marital status: Married    Spouse name: Not on file   Number of children: Not on file   Years of education: Not on file   Highest education level: Not on file  Occupational History   Not on file  Tobacco Use   Smoking status: Former    Packs/day: 1.00    Years: 10.00    Total pack years: 10.00    Types: Cigarettes    Quit date: 12/26/2000    Years since quitting: 21.8   Smokeless tobacco: Never  Vaping Use   Vaping Use: Never used  Substance and Sexual Activity   Alcohol use: Yes    Comment: very rarely   Drug use: Not Currently   Sexual activity: Not Currently  Other Topics Concern   Not on file  Social History Narrative   Not on file   Social Determinants of Health   Financial Resource Strain: Not on file  Food  Insecurity: Not on file  Transportation Needs: Not on file  Physical Activity: Not on file  Stress: Not on file  Social Connections: Not on file     Family History:  The patient's family history includes Breast cancer in her sister; Breast cancer (age of onset: 34) in her mother; Colon cancer in her mother; Heart attack in her father; Heart disease in her father; High Cholesterol in her father; Lupus in her sister; Rheum arthritis in her sister; Thyroid disease in her daughter, sister, sister, and sister.  ROS:   12-point review of systems is negative unless otherwise noted in the HPI.   EKGs/Labs/Other Studies Reviewed:    Studies reviewed were summarized above. The additional studies were reviewed today:  Lexiscan MPI 11/01/2021:   The study is normal. The study is low risk.   No ST deviation was noted.   LV perfusion  is normal. There is no evidence of ischemia. There is no evidence of infarction.   Left ventricular function is normal. End diastolic cavity size is normal. End systolic cavity size is normal.   CT attenuation images showed mild aortic and coronary calcifications. __________   2D echo 10/07/2021: 1. Left ventricular ejection fraction, by estimation, is 60 to 65%. The  left ventricle has normal function. The left ventricle has no regional  wall motion abnormalities. There is mild left ventricular hypertrophy.  Left ventricular diastolic parameters  are consistent with Grade I diastolic dysfunction (impaired relaxation).   2. Right ventricular systolic function is normal. The right ventricular  size is normal. There is normal pulmonary artery systolic pressure. The  estimated right ventricular systolic pressure is 76.2 mmHg.   3. The mitral valve is normal in structure. Mild mitral valve  regurgitation. No evidence of mitral stenosis.   4. The aortic valve is normal in structure. Aortic valve regurgitation is  not visualized. No aortic stenosis is present.   5. The  inferior vena cava is normal in size with greater than 50%  respiratory variability, suggesting right atrial pressure of 3 mmHg.   EKG:  EKG is ordered today.  The EKG ordered today demonstrates NSR, 80 bpm,, nonspecific ST-T changes, consistent with prior tracing  Recent Labs: 02/23/2022: TSH 2.400 10/17/2022: ALT 42; BUN 12; Creat 1.24; Hemoglobin 15.1; Platelets 203; Potassium 4.5; Sodium 141  Recent Lipid Panel    Component Value Date/Time   CHOL 192 02/23/2022 0913   TRIG 273 (H) 02/23/2022 0913   HDL 51 02/23/2022 0913   CHOLHDL 3.8 02/23/2022 0913   LDLCALC 95 02/23/2022 0913    PHYSICAL EXAM:    VS:  BP 122/76 (BP Location: Left Arm, Patient Position: Sitting, Cuff Size: Large)   Pulse 80   Ht 5\' 4"  (1.626 m)   Wt 255 lb 4.8 oz (115.8 kg)   SpO2 96%   BMI 43.82 kg/m   BMI: Body mass index is 43.82 kg/m.  Physical Exam Vitals reviewed.  Constitutional:      Appearance: She is well-developed.  HENT:     Head: Normocephalic and atraumatic.  Eyes:     General:        Right eye: No discharge.        Left eye: No discharge.  Neck:     Vascular: No JVD.  Cardiovascular:     Rate and Rhythm: Normal rate and regular rhythm.     Pulses:          Posterior tibial pulses are 2+ on the right side and 2+ on the left side.     Heart sounds: Normal heart sounds, S1 normal and S2 normal. Heart sounds not distant. No midsystolic click and no opening snap. No murmur heard.    No friction rub.  Pulmonary:     Effort: Pulmonary effort is normal. No respiratory distress.     Breath sounds: Normal breath sounds. No decreased breath sounds, wheezing or rales.  Chest:     Chest wall: No tenderness.  Abdominal:     General: There is no distension.  Musculoskeletal:     Cervical back: Normal range of motion.     Right lower leg: No edema.     Left lower leg: No edema.  Skin:    General: Skin is warm and dry.     Nails: There is no clubbing.  Neurological:     Mental Status:  She is alert  and oriented to person, place, and time.  Psychiatric:        Speech: Speech normal.        Behavior: Behavior normal.        Thought Content: Thought content normal.        Judgment: Judgment normal.     Wt Readings from Last 3 Encounters:  11/03/22 255 lb 4.8 oz (115.8 kg)  11/03/22 255 lb 4.8 oz (115.8 kg)  10/17/22 254 lb (115.2 kg)     ASSESSMENT & PLAN:   CAD involving the native coronary arteries without angina: She is doing well and is without symptoms of angina or decompensation.  Continue aggressive risk factor modification and clopidogrel as directed by primary cardiologist and losartan.  Therapy secondary to myalgias/arthralgias.  No longer on metoprolol following positional dizziness.  Defer further ischemic testing at this time.  Palpitations: More noticeable of metoprolol and following initiation of amlodipine.  Place Zio patch.  HTN: Blood pressure is well controlled in the office today.  She remains on amlodipine and losartan.  HLD: LDL 95 in 02/2022.  Noted to myalgias/arthralgias following initiation of atorvastatin leading this medication to be discontinued.  Self initiated red yeast rice.  Was advised this has fallen out of favor.  We will update liver function and LFTs in 2 months time.  Would consider initiation of ezetimibe or trial of rosuvastatin.  Dyspnea: Chronic and stable, dating back to COVID illness with significant restrictive lung disease on PFTs.  Cardiac work-up reassuring.  Follow-up with pulmonology as directed.  OSA: Intolerant to CPAP.     Disposition: F/u with Dr. Garen Lah or an APP in 2 months.   Medication Adjustments/Labs and Tests Ordered: Current medicines are reviewed at length with the patient today.  Concerns regarding medicines are outlined above. Medication changes, Labs and Tests ordered today are summarized above and listed in the Patient Instructions accessible in Encounters.   Signed, Christell Faith,  PA-C 11/03/2022 3:31 PM     [facility] [address] [place] [address] [place],  [address] [phone]

## 2022-10-30 DIAGNOSIS — J8410 Pulmonary fibrosis, unspecified: Secondary | ICD-10-CM | POA: Diagnosis not present

## 2022-11-03 ENCOUNTER — Ambulatory Visit: Payer: 59 | Attending: Physician Assistant | Admitting: Physician Assistant

## 2022-11-03 ENCOUNTER — Encounter: Payer: Self-pay | Admitting: Physician Assistant

## 2022-11-03 ENCOUNTER — Encounter: Payer: Self-pay | Admitting: Pulmonary Disease

## 2022-11-03 ENCOUNTER — Ambulatory Visit (INDEPENDENT_AMBULATORY_CARE_PROVIDER_SITE_OTHER): Payer: 59

## 2022-11-03 ENCOUNTER — Ambulatory Visit: Payer: Disability Insurance | Admitting: Pulmonary Disease

## 2022-11-03 VITALS — BP 122/76 | HR 80 | Ht 64.0 in | Wt 255.3 lb

## 2022-11-03 VITALS — BP 132/74 | HR 80 | Temp 98.0°F | Ht 64.0 in | Wt 255.3 lb

## 2022-11-03 DIAGNOSIS — R490 Dysphonia: Secondary | ICD-10-CM | POA: Diagnosis not present

## 2022-11-03 DIAGNOSIS — R002 Palpitations: Secondary | ICD-10-CM | POA: Diagnosis not present

## 2022-11-03 DIAGNOSIS — E785 Hyperlipidemia, unspecified: Secondary | ICD-10-CM

## 2022-11-03 DIAGNOSIS — G4736 Sleep related hypoventilation in conditions classified elsewhere: Secondary | ICD-10-CM | POA: Diagnosis not present

## 2022-11-03 DIAGNOSIS — G4733 Obstructive sleep apnea (adult) (pediatric): Secondary | ICD-10-CM

## 2022-11-03 DIAGNOSIS — J386 Stenosis of larynx: Secondary | ICD-10-CM

## 2022-11-03 DIAGNOSIS — I2510 Atherosclerotic heart disease of native coronary artery without angina pectoris: Secondary | ICD-10-CM

## 2022-11-03 DIAGNOSIS — J8410 Pulmonary fibrosis, unspecified: Secondary | ICD-10-CM | POA: Diagnosis not present

## 2022-11-03 DIAGNOSIS — R0602 Shortness of breath: Secondary | ICD-10-CM

## 2022-11-03 DIAGNOSIS — I10 Essential (primary) hypertension: Secondary | ICD-10-CM

## 2022-11-03 DIAGNOSIS — E669 Obesity, unspecified: Secondary | ICD-10-CM

## 2022-11-03 MED ORDER — CLOPIDOGREL BISULFATE 75 MG PO TABS: 75.0000 mg | ORAL_TABLET | Freq: Every day | ORAL | 0 refills | Status: DC

## 2022-11-03 NOTE — Progress Notes (Signed)
Subjective:    Patient ID: Bianca Michael, female    DOB: November 02, 1966, 56 y.o.   MRN: [id number] Patient Care Team: Mick Sell, MD as PCP - General (Infectious Diseases) Debbe Odea, MD as PCP - Cardiology (Cardiology) Salena Saner, MD as Consulting Physician (Pulmonary Disease)  Chief Complaint  Patient presents with   Follow-up    SOB with exertion. No wheezing or cough.    HPI Patient is a 56 year old remote former smoker who follows up for the issue of dyspnea after prolonged hospitalization for COVID-19 in January 2022 with intubation and subsequent tracheostomy.  After removal of her tracheostomy the patient has had persistent hoarseness.  She has had studies as noted below she has mild postinflammatory pulmonary fibrosis.  Her PFTs were mostly restrictive due to postinflammatory pulmonary fibrosis and obesity.  She has been using Stiolto which helps with cough and somewhat with shortness of breath.  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  The right true cord was normal.  She was referred to speech pathology however she did not follow due to the time commitment.  She was also referred to pulmonary rehabilitation however felt that it was too expensive and she declined.  It appears that when she exerts herself her main issue is shortness of breath on inspiration (it is hard to get a breath in).  This raises the possibility that she may have some exercise-induced laryngeal obstruction from her prior issues during hospitalization for COVID.   He was diagnosed with mild obstructive sleep apnea however she cannot tolerate CPAP.  Previously she had been on nocturnal oxygen she continues to use this.  She had humidity added to the concentrator and she has increased her liter flow to drive a humidifier.  She is currently on 3 L/min.  She notes restful sleep with this.  She uses  Stiolto which helps her control cough.  She states she saw ENT recently and that her prolapse false vocal cord appears to be improving.  Does seem slightly less hoarse today.  DATA: 09/07/2021  PFTs: FEV1 2.28 L or 86% predicted, FVC 2.57 L or 75% predicted, FEV1/FVC 89%, no bronchodilator response.  There is mild restrictive physiology due to ILD and/or obesity (ERV 24%) diffusion capacity was normal. 09/21/2021 CT chest high-resolution: Possible early interstitial lung disease areas of architectural distortion and groundglass attenuation likely representing infectious or inflammatory scarring.  Mild bronchiectasis. Evidence of three-vessel coronary artery disease.  Severe hepatic steatosis. 10/07/2021 2D echo: LVEF 60 to 65%, mild LVH.  Grade 1 DD.  Mild mitral regurg. 11/01/2021 Myoview: Normal stress test. 11/27/2021 Home sleep study: Mild obstructive sleep apnea with AHI of 9 and SPO2 low of 84%.  Review of Systems A 10 point review of systems was performed and it is as noted above otherwise negative.  Patient Active Problem List   Diagnosis Date Noted   Iron deficiency 10/17/2022   Elevated serum creatinine 08/16/2021   Morbid obesity (HCC) 08/16/2021   Positive ANA (antinuclear antibody) 07/26/2021   Inflammatory arthritis 07/21/2021   Screening for heart disease 07/21/2021   COVID-19 07/21/2021   History of pulmonary embolism 07/21/2021   Anxiety 07/21/2021   Acute pulmonary embolism without acute cor pulmonale (HCC) 04/07/2021   Elevated liver enzymes 01/28/2021   Hyperlipidemia 01/21/2021   Chronic neck and back pain 03/24/2020   Essential (primary) hypertension 03/13/2020   Major  depressive disorder 03/13/2020   Chronic ethmoidal sinusitis 04/02/2019   Chronic frontal sinusitis 04/02/2019   Chronic maxillary sinusitis 04/02/2019   Nasal polyp 04/02/2019   Thyroid disease 06/19/2018   Social History   Tobacco Use   Smoking status: Former    Packs/day: 1.00     Years: 10.00    Total pack years: 10.00    Types: Cigarettes    Quit date: 12/26/2000    Years since quitting: 21.8   Smokeless tobacco: Never  Substance Use Topics   Alcohol use: Yes    Comment: very rarely   No Known Allergies  Current Meds  Medication Sig   albuterol (VENTOLIN HFA) 108 (90 Base) MCG/ACT inhaler Inhale into the lungs.   amLODipine (NORVASC) 5 MG tablet Take 1 tablet (5 mg total) by mouth daily.   Ascorbic Acid (VITAMIN C) 1000 MG tablet Take 1,000 mg by mouth daily.   buPROPion HCl ER, XL, 450 MG TB24 Take by mouth.   Cholecalciferol (VITAMIN D3 ULTRA STRENGTH PO) Take 5,000 Int'l Units/day by mouth.   clopidogrel (PLAVIX) 75 MG tablet Take 1 tablet (75 mg total) by mouth daily.   hydroxychloroquine (PLAQUENIL) 200 MG tablet Take 2 tablets (400 mg total) by mouth daily.   levothyroxine (SYNTHROID) 112 MCG tablet Take 1 tablet (112 mcg total) by mouth daily. needs appointment and labs for further refills   losartan (COZAAR) 25 MG tablet Take 25 mg by mouth daily.   magnesium oxide (MAG-OX) 400 MG tablet Take by mouth.   MILK THISTLE PO Take 1 capsule by mouth daily.   Multiple Vitamins-Minerals (WOMENS MULTI) CAPS Take 1 capsule by mouth daily.   naltrexone (DEPADE) 50 MG tablet Take 50 mg by mouth daily.   naltrexone (DEPADE) 50 MG tablet daily.   omeprazole (PRILOSEC) 40 MG capsule TAKE 1 CAPSULE (40 MG TOTAL) BY MOUTH DAILY.   Tiotropium Bromide-Olodaterol (STIOLTO RESPIMAT) 2.5-2.5 MCG/ACT AERS Inhale 2 puffs into the lungs daily.   tiZANidine (ZANAFLEX) 4 MG tablet Take 1 tablet (4 mg total) by mouth at bedtime.   zinc gluconate 50 MG tablet Take 50 mg by mouth daily.   Immunization History  Administered Date(s) Administered   Hep A / Hep B 12/14/2021, 01/31/2022      Objective:   Physical Exam BP 132/74 (BP Location: Left Arm, Cuff Size: Large)   Pulse 80   Temp 98 F (36.7 C)   Ht 5\' 4"  (1.626 m)   Wt 255 lb 4.8 oz (115.8 kg)   SpO2 96%   BMI  43.82 kg/m  GENERAL: Obese woman, no acute distress, fully ambulatory, raspy voice, no conversational dyspnea.  Voice appears clearer than on prior visit. HEAD: Normocephalic, atraumatic.  EYES: Pupils equal, round, reactive to light.  No scleral icterus.  MOUTH: Nose/mouth/throat not examined due to masking requirements for COVID 19. NECK: Supple. No thyromegaly. Trachea midline. No JVD.  No adenopathy.  Well-healed tracheostomy scar. PULMONARY: Good air entry bilaterally.  No adventitious sounds. CARDIOVASCULAR: S1 and S2. Regular rate and rhythm.  Grade 1/6 mitral regurgitation murmur. ABDOMEN: Obese, well-healed PEG scar. MUSCULOSKELETAL: No joint deformity, no clubbing, no edema.  NEUROLOGIC: No focal deficit, no gait disturbance, speech is fluent. SKIN: Intact,warm,dry.  Mild discoloration of the toes distally, no frank ischemia.  Regrowing toenails. PSYCH: Mood and behavior normal         Assessment & Plan:     ICD-10-CM   1. Postinflammatory pulmonary fibrosis (HCC)  J84.10  Consider repeat CT high-resolution Patient would like to hold off due to cost for now Clinically appears stable/improved    2. Shortness of breath  R06.02    Improving somewhat Would benefit from pulmonary rehab Patient declines due to cost    3. Voice hoarsenesses  R49.0    Issues with prolapse false vocal cord Per ENT improving Recommended harmonica exercises    4. Exercise induced laryngeal obstruction (EILO) -rule out  J38.6    Continue to monitor Possibility given her symptoms    5. Nocturnal hypoxemia due to obesity  E66.9    G47.36    Patient compliant with O2 Notes benefit from therapy    6. OSA (obstructive sleep apnea)  G47.33    Unable to wear CPAP On nocturnal oxygen     Patient appears to be improving slowly.  Recommended using harmonica to exercise respiratory muscles.  We will see her in follow-up in 3 months time she is to contact us prior to that time should any new  difficulties arise.  Gailen Shelter, MD Advanced Bronchoscopy PCCM [facility]    *This note was dictated using voice recognition software/Dragon.  Despite best efforts to proofread, errors can occur which can change the meaning. Any transcriptional errors that result from this process are unintentional and may not be fully corrected at the time of dictation.

## 2022-11-03 NOTE — Patient Instructions (Signed)
On the Internet you can find articles on how to use a harmonica to help strengthen your respiratory muscles.  Recommend this it would also help with your vocal cords.  We we will see you in follow-up in 3 months time call sooner should any new problems arise.

## 2022-11-03 NOTE — Patient Instructions (Signed)
Medication Instructions:  No changes at this time.   *If you need a refill on your cardiac medications before your next appointment, please call your pharmacy*   Lab Work: None  If you have labs (blood work) drawn today and your tests are completely normal, you will receive your results only by: MyChart Message (if you have MyChart) OR A paper copy in the mail If you have any lab test that is abnormal or we need to change your treatment, we will call you to review the results.   Testing/Procedures: Your physician has recommended that you wear a Zio monitor.   This monitor is a medical device that records the heart's electrical activity. Doctors most often use these monitors to diagnose arrhythmias. Arrhythmias are problems with the speed or rhythm of the heartbeat. The monitor is a small device applied to your chest. You can wear one while you do your normal daily activities. While wearing this monitor if you have any symptoms to push the button and record what you felt. Once you have worn this monitor for the period of time provider prescribed (Usually 14 days), you will return the monitor device in the postage paid box. Once it is returned they will download the data collected and provide [place] with a report which the provider will then review and we will call you with those results. Important tips:  Avoid showering during the first 24 hours of wearing the monitor. Avoid excessive sweating to help maximize wear time. Do not submerge the device, no hot tubs, and no swimming pools. Keep any lotions or oils away from the patch. After 24 hours you may shower with the patch on. Take brief showers with your back facing the shower head.  Do not remove patch once it has been placed because that will interrupt data and decrease adhesive wear time. Push the button when you have any symptoms and write down what you were feeling. Once you have completed wearing your monitor, remove and place into box  which has postage paid and place in your outgoing mailbox.  If for some reason you have misplaced your box then call our office and we can provide another box and/or mail it off for you.      Follow-Up: At [facility], you and your health needs are our priority.  As part of our continuing mission to provide you with exceptional heart care, we have created designated Provider Care Teams.  These Care Teams include your primary Cardiologist (physician) and Advanced Practice Providers (APPs -  Physician Assistants and Nurse Practitioners) who all work together to provide you with the care you need, when you need it.   Your next appointment:   2 month(s)  The format for your next appointment:   In Person  Provider:   Debbe Odea, MD or Eula Listen, PA-C        Important Information About Sugar

## 2022-11-07 DIAGNOSIS — M1990 Unspecified osteoarthritis, unspecified site: Secondary | ICD-10-CM | POA: Diagnosis not present

## 2022-11-07 DIAGNOSIS — G4733 Obstructive sleep apnea (adult) (pediatric): Secondary | ICD-10-CM | POA: Diagnosis not present

## 2022-11-07 DIAGNOSIS — R002 Palpitations: Secondary | ICD-10-CM | POA: Diagnosis not present

## 2022-11-07 DIAGNOSIS — E039 Hypothyroidism, unspecified: Secondary | ICD-10-CM | POA: Diagnosis not present

## 2022-11-07 DIAGNOSIS — I2510 Atherosclerotic heart disease of native coronary artery without angina pectoris: Secondary | ICD-10-CM | POA: Diagnosis not present

## 2022-11-07 DIAGNOSIS — Z6841 Body Mass Index (BMI) 40.0 and over, adult: Secondary | ICD-10-CM | POA: Diagnosis not present

## 2022-11-07 DIAGNOSIS — I10 Essential (primary) hypertension: Secondary | ICD-10-CM | POA: Diagnosis not present

## 2022-11-07 DIAGNOSIS — Z86711 Personal history of pulmonary embolism: Secondary | ICD-10-CM | POA: Diagnosis not present

## 2022-11-07 DIAGNOSIS — E785 Hyperlipidemia, unspecified: Secondary | ICD-10-CM | POA: Diagnosis not present

## 2022-11-07 DIAGNOSIS — E079 Disorder of thyroid, unspecified: Secondary | ICD-10-CM | POA: Diagnosis not present

## 2022-11-29 DIAGNOSIS — R002 Palpitations: Secondary | ICD-10-CM | POA: Diagnosis not present

## 2022-11-29 DIAGNOSIS — J8410 Pulmonary fibrosis, unspecified: Secondary | ICD-10-CM | POA: Diagnosis not present

## 2022-12-05 ENCOUNTER — Telehealth: Payer: Self-pay | Admitting: *Deleted

## 2022-12-05 NOTE — Telephone Encounter (Signed)
Left voicemail message to call back for review of results.  

## 2022-12-05 NOTE — Telephone Encounter (Signed)
-----   Message from Sondra Barges, PA-C sent at 12/02/2022  7:21 AM EST ----- Heart monitor showed a predominant rhythm of sinus with an average rate of 81 bpm with no significant arrhythmias or prolonged pauses with rare atrial and ventricular ectopy.  Patient triggered events were associated with sinus rhythm.

## 2022-12-12 DIAGNOSIS — I10 Essential (primary) hypertension: Secondary | ICD-10-CM | POA: Diagnosis not present

## 2022-12-12 DIAGNOSIS — N1832 Chronic kidney disease, stage 3b: Secondary | ICD-10-CM | POA: Diagnosis not present

## 2022-12-21 ENCOUNTER — Ambulatory Visit
Admission: EM | Admit: 2022-12-21 | Discharge: 2022-12-21 | Disposition: A | Discharge status: Home / Self Care | Payer: 59 | Attending: Physician Assistant | Admitting: Physician Assistant

## 2022-12-21 ENCOUNTER — Encounter: Payer: Self-pay | Admitting: Emergency Medicine

## 2022-12-21 ENCOUNTER — Telehealth: Payer: Self-pay

## 2022-12-21 DIAGNOSIS — R051 Acute cough: Secondary | ICD-10-CM

## 2022-12-21 DIAGNOSIS — J019 Acute sinusitis, unspecified: Secondary | ICD-10-CM | POA: Diagnosis not present

## 2022-12-21 MED ORDER — AMOXICILLIN-POT CLAVULANATE 875-125 MG PO TABS: 1.0000 | ORAL_TABLET | Freq: Two times a day (BID) | ORAL | 0 refills | Status: AC

## 2022-12-21 MED ORDER — CHERATUSSIN AC 100-10 MG/5ML PO SOLN: 10.0000 mL | Freq: Three times a day (TID) | ORAL | 0 refills | Status: DC | PRN

## 2022-12-21 NOTE — ED Triage Notes (Signed)
Pt presents with a cough and sinus pressure x 1 week.

## 2022-12-21 NOTE — ED Provider Notes (Signed)
[facility]    CSN: [id number] Arrival date & time: 12/21/22  1033      History   Chief Complaint Chief Complaint  Patient presents with   Cough   Sinus Pressure     HPI Bianca Michael is a 56 y.o. female presenting for approximately 1 week history of cough and congestion.  Patient states that it started with nasal congestion and she continues to have nasal congestion and sinus pressure.  She says that over the past couple days she felt like the infection is moved into her chest.  She admits to productive cough.  She has yellowish-green mucus.  She says that she is also having some increased shortness of breath and has to use her albuterol inhaler couple times a day.  Patient has personal history of COVID-19, pneumonia due to COVID-19, and pulmonary embolism.  Patient was hospitalized in February 2022 for this.  She also suffered multiple other complications including renal failure, peripheral vascular disease, and hypercoagulability.    Patient denies any fevers.  She has been fatigued. Patient reports sinus pressure.  She says she has had some sinus pressure.  She reports concerned about potential infection moving into her chest.  States she is normally prescribed Augmentin x 10 days and a strong cough medication.  History of sinusitis. Patient has been taking over-the-counter Mucinex and Robitussin.  No known flu or COVID exposure.  She denies any other symptoms or complaints.  HPI  Past Medical History:  Diagnosis Date   Anxiety    Chronic kidney disease    COVID-19 12/2020   Patient was hospitalized   Depression    Hyperlipidemia    Hypertension    Inflammatory arthritis    Pneumothorax    Left lung   Pulmonary embolism [facility])    Thyroid disease     Patient Active Problem List   Diagnosis Date Noted   Iron deficiency 10/17/2022   Elevated serum creatinine 08/16/2021   Morbid obesity (HCC) 08/16/2021   Positive ANA (antinuclear antibody) 07/26/2021    Inflammatory arthritis 07/21/2021   Screening for heart disease 07/21/2021   COVID-19 07/21/2021   History of pulmonary embolism 07/21/2021   Anxiety 07/21/2021   Acute pulmonary embolism without acute cor pulmonale (HCC) 04/07/2021   Elevated liver enzymes 01/28/2021   Hyperlipidemia 01/21/2021   Chronic neck and back pain 03/24/2020   Essential (primary) hypertension 03/13/2020   Major depressive disorder 03/13/2020   Chronic ethmoidal sinusitis 04/02/2019   Chronic frontal sinusitis 04/02/2019   Chronic maxillary sinusitis 04/02/2019   Nasal polyp 04/02/2019   Thyroid disease 06/19/2018    Past Surgical History:  Procedure Laterality Date   CARPAL TUNNEL RELEASE     CHOLECYSTECTOMY     TONSILLECTOMY  1970   TUBAL LIGATION      OB History   No obstetric history on file.      Home Medications    Prior to Admission medications   Medication Sig Start Date End Date Taking? Authorizing Provider  amoxicillin-clavulanate (AUGMENTIN) 875-125 MG tablet Take 1 tablet by mouth every 12 (twelve) hours for 10 days. 12/21/22 12/31/22 Yes Shirlee Latch, PA-C  guaiFENesin-codeine (CHERATUSSIN AC) 100-10 MG/5ML syrup Take 10 mLs by mouth 3 (three) times daily as needed for cough. 12/21/22  Yes Eusebio Friendly B, PA-C  albuterol (VENTOLIN HFA) 108 (90 Base) MCG/ACT inhaler Inhale into the lungs. 11/20/20   [provider]  amLODipine (NORVASC) 5 MG tablet Take 1 tablet (5 mg total)  by mouth daily. 09/14/22   Dunn, Raymon Mutton, PA-C  Ascorbic Acid (VITAMIN C) 1000 MG tablet Take 1,000 mg by mouth daily.    [provider]  buPROPion HCl ER, XL, 450 MG TB24 Take by mouth. 03/24/22 03/24/23  [provider]  Cholecalciferol (VITAMIN D3 ULTRA STRENGTH PO) Take 5,000 Int'l Units/day by mouth.    [provider]  clopidogrel (PLAVIX) 75 MG tablet Take 1 tablet (75 mg total) by mouth daily. 11/03/22   Dunn, Raymon Mutton, PA-C  hydroxychloroquine (PLAQUENIL) 200 MG tablet  Take 2 tablets (400 mg total) by mouth daily. 10/17/22   Fuller Plan, MD  levothyroxine (SYNTHROID) 112 MCG tablet Take 1 tablet (112 mcg total) by mouth daily. needs appointment and labs for further refills 01/31/22   Vigg, Avanti, MD  losartan (COZAAR) 25 MG tablet Take 25 mg by mouth daily.    [provider]  magnesium oxide (MAG-OX) 400 MG tablet Take by mouth.    [provider]  MILK THISTLE PO Take 1 capsule by mouth daily.    [provider]  Multiple Vitamins-Minerals (WOMENS MULTI) CAPS Take 1 capsule by mouth daily.    [provider]  naltrexone (DEPADE) 50 MG tablet Take 50 mg by mouth daily. 06/01/22   [provider]  naltrexone (DEPADE) 50 MG tablet daily. 04/04/22   [provider]  omeprazole (PRILOSEC) 40 MG capsule TAKE 1 CAPSULE (40 MG TOTAL) BY MOUTH DAILY. 01/09/22   Vigg, Avanti, MD  Red Yeast Rice Extract (RED YEAST RICE PO) Take 2 capsules by mouth daily.    [provider]  Tiotropium Bromide-Olodaterol (STIOLTO RESPIMAT) 2.5-2.5 MCG/ACT AERS Inhale 2 puffs into the lungs daily. 02/24/22   Salena Saner, MD  tiZANidine (ZANAFLEX) 4 MG tablet Take 1 tablet (4 mg total) by mouth at bedtime. 10/17/22   Rice, Jamesetta Orleans, MD  zinc gluconate 50 MG tablet Take 50 mg by mouth daily.    [provider]    Family History Family History  Problem Relation Age of Onset   Breast cancer Mother 9   Colon cancer Mother    Heart attack Father    Heart disease Father    High Cholesterol Father    Thyroid disease Sister    Breast cancer Sister    Thyroid disease Sister    Thyroid disease Sister    Lupus Sister    Rheum arthritis Sister    Thyroid disease Daughter     Social History Social History   Tobacco Use   Smoking status: Former    Packs/day: 1.00    Years: 10.00    Total pack years: 10.00    Types: Cigarettes    Quit date: 12/26/2000    Years since quitting: 22.0   Smokeless  tobacco: Never  Vaping Use   Vaping Use: Never used  Substance Use Topics   Alcohol use: Yes    Comment: very rarely   Drug use: Not Currently     Allergies   Patient has no known allergies.   Review of Systems Review of Systems  Constitutional:  Positive for fatigue. Negative for chills, diaphoresis and fever.  HENT:  Positive for congestion, rhinorrhea, sinus pressure and sinus pain. Negative for ear pain and sore throat.   Respiratory:  Positive for cough and shortness of breath. Negative for chest tightness and wheezing.   Cardiovascular:  Negative for chest pain.  Gastrointestinal:  Negative for abdominal pain, nausea and vomiting.  Musculoskeletal:  Negative for arthralgias and myalgias.  Skin:  Negative for rash.  Neurological:  Positive for headaches. Negative for weakness.  Hematological:  Negative for adenopathy.     Physical Exam Triage Vital Signs ED Triage Vitals  Enc Vitals Group     BP 04/19/21 1718 (!) 153/89     Pulse Rate 04/19/21 1718 82     Resp 04/19/21 1718 18     Temp 04/19/21 1718 98.3 F (36.8 C)     Temp Source 04/19/21 1718 Oral     SpO2 04/19/21 1718 97 %     Weight 04/19/21 1717 203 lb (92.1 kg)     Height 04/19/21 1717 5\' 4"  (1.626 m)     Head Circumference --      Peak Flow --      Pain Score --      Pain Loc --      Pain Edu? --      Excl. in GC? --    No data found.  Updated Vital Signs BP 124/78 (BP Location: Left Arm)   Pulse 72   Temp 97.7 F (36.5 C) (Oral)   Resp 16   SpO2 95%    Physical Exam Vitals and nursing note reviewed.  Constitutional:      General: She is not in acute distress.    Appearance: Normal appearance. She is not ill-appearing or toxic-appearing.  HENT:     Head: Normocephalic and atraumatic.     Right Ear: Tympanic membrane, ear canal and external ear normal.     Left Ear: Tympanic membrane, ear canal and external ear normal.     Nose: Congestion and rhinorrhea present.     Mouth/Throat:      Mouth: Mucous membranes are moist.     Pharynx: Oropharynx is clear.  Eyes:     General: No scleral icterus.       Right eye: No discharge.        Left eye: No discharge.     Conjunctiva/sclera: Conjunctivae normal.  Cardiovascular:     Rate and Rhythm: Normal rate and regular rhythm.     Heart sounds: Normal heart sounds.  Pulmonary:     Effort: Pulmonary effort is normal. No respiratory distress.     Breath sounds: Normal breath sounds. No wheezing, rhonchi or rales.  Musculoskeletal:     Cervical back: Neck supple.  Skin:    General: Skin is dry.  Neurological:     General: No focal deficit present.     Mental Status: She is alert. Mental status is at baseline.     Motor: No weakness.     Gait: Gait normal.  Psychiatric:        Mood and Affect: Mood normal.        Behavior: Behavior normal.        Thought Content: Thought content normal.      UC Treatments / Results  Labs (all labs ordered are listed, but only abnormal results are displayed) Labs Reviewed - No data to display  EKG   Radiology No results found.   Procedures Procedures (including critical care time)  Medications Ordered in UC Medications - No data to display  Initial Impression / Assessment and Plan / UC Course  I have reviewed the triage vital signs and the nursing notes.  Pertinent labs & imaging results that were available during my care of the patient were reviewed by me and considered in my medical decision making (see chart  for details).   56 y/o female presenting for 1 week history of productive cough, sinus pressure, congestion, and increased SOB.  Patient does have complicated medical history of COVID-19  with severe complications from that including continued shortness of breath and cough.  She ended up yet a pulmonary embolism on, hypercoagulability, COVID toes, renal failure, and COVID-pneumonia at that time.  Patient is still recovering.  She does have an upcoming appointment with a  pulmonologist.  Her vital signs are all stable today.  She is afebrile and her oxygen is 95%.  Exam significant for nasal congestion with thick yellow nasal drainage and sinus tenderness.  Her chest is clear to auscultation heart regular rate and rhythm.  Patient concerned about sinus infection and getting pneumonia again.  She does request an antibiotic.  Advised her this is likely a viral illness but given her complicated medical history we can try an antibiotic to cover her for a sinus infection.  Sent Augmentin to pharmacy.  Have also sent Cheratussin after reviewing the controlled substance database.  Advised her to continue with her at home inhalers.  Advised to continue following up with her PCP and specialist.  Reviewed ED red flag signs and symptoms with patient.   Final Clinical Impressions(s) / UC Diagnoses   Final diagnoses:  Acute sinusitis, recurrence not specified, unspecified location  Acute cough   Discharge Instructions   None     ED Prescriptions     Medication Sig Dispense Auth. Provider   amoxicillin-clavulanate (AUGMENTIN) 875-125 MG tablet Take 1 tablet by mouth every 12 (twelve) hours for 10 days. 20 tablet Eusebio Friendly B, PA-C   guaiFENesin-codeine (CHERATUSSIN AC) 100-10 MG/5ML syrup Take 10 mLs by mouth 3 (three) times daily as needed for cough. 120 mL Shirlee Latch, PA-C      I have reviewed the PDMP during this encounter.     Shirlee Latch, PA-C 12/21/22 1356

## 2022-12-21 NOTE — Telephone Encounter (Signed)
S/w pt in regards to her abx & cheratussin. Pt stated that [facility] [place] didn't [facility] in [facility]. Called [facility] to ensure they received [facility] & they did indeed, they stated they just reopened back from lunch & pt's rx's would be ready for p/u in 1 hr. Related msg to pt & she verbalized understanding to info.

## 2022-12-22 NOTE — Telephone Encounter (Signed)
Patient has reviewed her results via My Chart. Closing encounter.

## 2022-12-27 DIAGNOSIS — R490 Dysphonia: Secondary | ICD-10-CM | POA: Diagnosis not present

## 2022-12-27 DIAGNOSIS — J383 Other diseases of vocal cords: Secondary | ICD-10-CM | POA: Diagnosis not present

## 2022-12-30 DIAGNOSIS — J8410 Pulmonary fibrosis, unspecified: Secondary | ICD-10-CM | POA: Diagnosis not present

## 2023-01-06 ENCOUNTER — Ambulatory Visit: Payer: 59 | Admitting: Physician Assistant

## 2023-01-14 ENCOUNTER — Other Ambulatory Visit: Payer: Self-pay | Admitting: Internal Medicine

## 2023-01-14 DIAGNOSIS — M542 Cervicalgia: Secondary | ICD-10-CM

## 2023-01-16 NOTE — Progress Notes (Signed)
Cardiology Office Note    Date:  01/17/2023   ID:  Michaelah, Credeur 1966-07-22, MRN [id number]  PCP:  Mick Sell, MD  Cardiologist:  Debbe Odea, MD  Electrophysiologist:  None   Chief Complaint: Follow up  History of Present Illness:   Bianca Michael is a 57 y.o. 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   She was admitted to the hospital in 12/2020 with COVID with multiple complications including acute hypoxic respiratory failure requiring intubation, ARF requiring temporary dialysis, right ventricular thrombus, PE, and secondary spontaneous pneumothorax treated with chest tube.  Echo during the admission showed preserved biventricular function.   PFTs in 08/2021 showed mild to moderate restrictive lung disease.  Echo in 09/2021 demonstrated an EF of 60 to 65%, no regional wall motion abnormalities, mild LVH, grade 1 diastolic dysfunction, normal RV systolic function, ventricular cavity size, and PASP, mild mitral regurgitation, and an estimated right atrial pressure of 3 mmHg.   She was initially evaluated by Dr. Azucena Cecil in 09/2021 and started on clopidogrel at that time for CAD.  Subsequent Lexiscan MPI in 10/2021 showed no evidence of ischemia or infarction and was found to be overall low risk.  CT attenuated corrected images showed mild aortic atherosclerosis and coronary artery calcifications.  She was seen in the office in 08/2022 and was without symptoms of angina or decompensation with noted stable chronic dyspnea.  She did note myalgias/arthralgias following the initiation of atorvastatin.  With this, she had not been  as active due to discomfort.  In this setting, atorvastatin was discontinued.  She also noted some positional dizziness following the initiation of Lopressor.  Given dizziness, Lopressor was discontinued.   She was last seen in the office in 10/2022 and was without symptoms of angina or decompensation with stable chronic dyspnea.  She noted improvement in her myalgias/arthralgias following discontinuation of atorvastatin, though not resolution.  She had self started red yeast rice.  She did note some palpitations following the initiation of amlodipine with subsequent Zio patch showing a predominant rhythm of sinus with an average rate of 81 bpm with rare PACs and PVCs.  Patient triggered events were associated with sinus rhythm.  She comes in doing well from a card perspective and is without symptoms of angina or decompensation.  She is getting over a URI.  No dizziness, presyncope, or syncope.  No significant lower extremity swelling.  She continues to be limited in a functional status capacity secondary to low back pain and cramping in her feet/calf muscles.  Has not been completely adherent to red yeast rice and would prefer to resume this medication in place of pursuing labs and alternative lipid-lowering modalities.  Also interested in restarting her juicing.  Her weight is down 4 pounds when compared to her last clinic visit.   Labs independently reviewed: 11/2022 - Hgb 14.1, PLT 186, BUN 13, serum creatinine 1.27, potassium 4.1, albumin 4.2 09/2022 - AST normal, ALT 4 07/2022 - TSH normal 02/2022 - TC 192, TG 273, HDL 51, LDL 95   Past Medical History:  Diagnosis Date   Anxiety    Chronic kidney disease    COVID-19 12/2020   Patient was hospitalized   Depression  Hyperlipidemia    Hypertension    Inflammatory arthritis    Pneumothorax    Left lung   Pulmonary embolism (HCC)    Thyroid disease     Past Surgical History:  Procedure Laterality Date   CARPAL TUNNEL RELEASE      CHOLECYSTECTOMY     TONSILLECTOMY  1970   TUBAL LIGATION      Current Medications: Current Meds  Medication Sig   albuterol (VENTOLIN HFA) 108 (90 Base) MCG/ACT inhaler Inhale into the lungs.   Ascorbic Acid (VITAMIN C) 1000 MG tablet Take 1,000 mg by mouth daily.   buPROPion HCl ER, XL, 450 MG TB24 Take by mouth.   Cholecalciferol (VITAMIN D3 ULTRA STRENGTH PO) Take 5,000 Int'l Units/day by mouth.   clopidogrel (PLAVIX) 75 MG tablet Take 1 tablet (75 mg total) by mouth daily.   guaiFENesin-codeine (CHERATUSSIN AC) 100-10 MG/5ML syrup Take 10 mLs by mouth 3 (three) times daily as needed for cough.   hydroxychloroquine (PLAQUENIL) 200 MG tablet Take 2 tablets (400 mg total) by mouth daily.   levothyroxine (SYNTHROID) 112 MCG tablet Take 1 tablet (112 mcg total) by mouth daily. needs appointment and labs for further refills   losartan (COZAAR) 100 MG tablet Take 100 mg by mouth daily.   magnesium oxide (MAG-OX) 400 MG tablet Take by mouth.   MILK THISTLE PO Take 1 capsule by mouth daily.   Multiple Vitamins-Minerals (WOMENS MULTI) CAPS Take 1 capsule by mouth daily.   naltrexone (DEPADE) 50 MG tablet Take 50 mg by mouth daily.   omeprazole (PRILOSEC) 40 MG capsule TAKE 1 CAPSULE (40 MG TOTAL) BY MOUTH DAILY.   Red Yeast Rice Extract (RED YEAST RICE PO) Take 2 capsules by mouth daily.   Tiotropium Bromide-Olodaterol (STIOLTO RESPIMAT) 2.5-2.5 MCG/ACT AERS Inhale 2 puffs into the lungs daily.   tiZANidine (ZANAFLEX) 4 MG tablet TAKE 1 TABLET BY MOUTH AT BEDTIME.   zinc gluconate 50 MG tablet Take 50 mg by mouth daily.   [DISCONTINUED] amLODipine (NORVASC) 5 MG tablet Take 1 tablet (5 mg total) by mouth daily.    Allergies:   Patient has no known allergies.   Social History   Socioeconomic History   Marital status: Married    Spouse name: Not on file   Number of children: Not on file   Years of education: Not on file   Highest education level: Not on file  Occupational History    Not on file  Tobacco Use   Smoking status: Former    Packs/day: 1.00    Years: 10.00    Total pack years: 10.00    Types: Cigarettes    Quit date: 12/26/2000    Years since quitting: 22.0   Smokeless tobacco: Never  Vaping Use   Vaping Use: Never used  Substance and Sexual Activity   Alcohol use: Yes    Comment: very rarely   Drug use: Not Currently   Sexual activity: Not Currently  Other Topics Concern   Not on file  Social History Narrative   Not on file   Social Determinants of Health   Financial Resource Strain: Not on file  Food Insecurity: Not on file  Transportation Needs: Not on file  Physical Activity: Not on file  Stress: Not on file  Social Connections: Not on file     Family History:  The patient's family history includes Breast cancer in her sister; Breast cancer (age of onset: 75) in her mother; Colon cancer in her mother;  Heart attack in her father; Heart disease in her father; High Cholesterol in her father; Lupus in her sister; Rheum arthritis in her sister; Thyroid disease in her daughter, sister, sister, and sister.  ROS:   12-point review of systems is negative unless otherwise noted in the HPI.   EKGs/Labs/Other Studies Reviewed:    Studies reviewed were summarized above. The additional studies were reviewed today:  Zio patch 10/2022: Patient had a min HR of 56 bpm, max HR of 137 bpm, and avg HR of 81 bpm. Predominant underlying rhythm was Sinus Rhythm. Isolated SVEs were rare (<1.0%), SVE Couplets were rare (<1.0%), and SVE Triplets were rare (<1.0%). Isolated VEs were rare (<1.0%,  16), VE Triplets were rare (<1.0%, 1), and no VE Couplets were present.    Conclusion Normal cardiac monitor with no significant arrhythmias,  patient triggered events associated with sinus rhythm. __________  Carlton Adam MPI 11/01/2021:   The study is normal. The study is low risk.   No ST deviation was noted.   LV perfusion is normal. There is no evidence of  ischemia. There is no evidence of infarction.   Left ventricular function is normal. End diastolic cavity size is normal. End systolic cavity size is normal.   CT attenuation images showed mild aortic and coronary calcifications. __________   2D echo 10/07/2021: 1. Left ventricular ejection fraction, by estimation, is 60 to 65%. The  left ventricle has normal function. The left ventricle has no regional  wall motion abnormalities. There is mild left ventricular hypertrophy.  Left ventricular diastolic parameters  are consistent with Grade I diastolic dysfunction (impaired relaxation).   2. Right ventricular systolic function is normal. The right ventricular  size is normal. There is normal pulmonary artery systolic pressure. The  estimated right ventricular systolic pressure is 78.6 mmHg.   3. The mitral valve is normal in structure. Mild mitral valve  regurgitation. No evidence of mitral stenosis.   4. The aortic valve is normal in structure. Aortic valve regurgitation is  not visualized. No aortic stenosis is present.   5. The inferior vena cava is normal in size with greater than 50%  respiratory variability, suggesting right atrial pressure of 3 mmHg.   EKG:  EKG is not ordered today.    Recent Labs: 02/23/2022: TSH 2.400 10/17/2022: ALT 42; BUN 12; Creat 1.24; Hemoglobin 15.1; Platelets 203; Potassium 4.5; Sodium 141  Recent Lipid Panel    Component Value Date/Time   CHOL 192 02/23/2022 0913   TRIG 273 (H) 02/23/2022 0913   HDL 51 02/23/2022 0913   CHOLHDL 3.8 02/23/2022 0913   LDLCALC 95 02/23/2022 0913    PHYSICAL EXAM:    VS:  BP 128/70 (BP Location: Left Arm, Patient Position: Sitting, Cuff Size: Large)   Pulse 76   Ht 5' 4.5" (1.638 m)   Wt 251 lb 5 oz (114 kg)   SpO2 98%   BMI 42.47 kg/m   BMI: Body mass index is 42.47 kg/m.  Physical Exam Vitals reviewed.  Constitutional:      Appearance: She is well-developed.  HENT:     Head: Normocephalic and  atraumatic.  Eyes:     General:        Right eye: No discharge.        Left eye: No discharge.  Neck:     Vascular: No JVD.  Cardiovascular:     Rate and Rhythm: Normal rate and regular rhythm.     Pulses:  Posterior tibial pulses are 2+ on the right side and 2+ on the left side.     Heart sounds: Normal heart sounds, S1 normal and S2 normal. Heart sounds not distant. No midsystolic click and no opening snap. No murmur heard.    No friction rub.  Pulmonary:     Effort: Pulmonary effort is normal. No respiratory distress.     Breath sounds: Normal breath sounds. No decreased breath sounds, wheezing or rales.  Chest:     Chest wall: No tenderness.  Abdominal:     General: There is no distension.  Musculoskeletal:     Cervical back: Normal range of motion.     Right lower leg: No edema.     Left lower leg: No edema.  Skin:    General: Skin is warm and dry.     Nails: There is no clubbing.  Neurological:     Mental Status: She is alert and oriented to person, place, and time.  Psychiatric:        Speech: Speech normal.        Behavior: Behavior normal.        Thought Content: Thought content normal.        Judgment: Judgment normal.     Wt Readings from Last 3 Encounters:  01/17/23 251 lb 5 oz (114 kg)  11/03/22 255 lb 4.8 oz (115.8 kg)  11/03/22 255 lb 4.8 oz (115.8 kg)     ASSESSMENT & PLAN:   CAD involving the native coronary arteries without angina: She is doing well without symptoms concerning for angina or decompensation.  Continue aggressive risk factor modification including clopidogrel and losartan.  No longer on statin therapy secondary to myalgias/arthralgias as outlined below.  No longer on metoprolol secondary to positional dizziness.  No indication for further ischemic testing at this time.  Palpitations: No further symptoms.  Reassuring Zio patch.  HTN: Blood pressure is well-controlled in the office today.  She remains on amlodipine and  losartan.  HLD: LDL 95 in 02/2022.  Noted to myalgias/arthralgias following initiation of atorvastatin leading this medication to be discontinued.  However, knee discomfort has persisted.  She prefers to reintroduce juicing and red yeast rice at this time with deferment of follow-up lipid panel and consideration of alternative lipid-lowering therapies.  Revisit in follow-up.  Dyspnea: Chronic and stable.  Dating back to COVID illness with significant restrictive lung disease noted on PFTs.  Cardiac workup reassuring.  Follow-up with pulmonology as directed.  OSA: Intolerant to CPAP.  We did not discuss this today.  Lower extremity/pedal cramping: Declines ABIs.  Distal pedal pulses 2+.    Disposition: F/u with Dr. Azucena Cecil or an APP in 6 months.   Medication Adjustments/Labs and Tests Ordered: Current medicines are reviewed at length with the patient today.  Concerns regarding medicines are outlined above. Medication changes, Labs and Tests ordered today are summarized above and listed in the Patient Instructions accessible in Encounters.   Signed, Eula Listen, PA-C 01/17/2023 9:51 AM     [facility] [place] [address] [place], [place] [address] [phone]

## 2023-01-16 NOTE — Telephone Encounter (Signed)
Next Visit: 04/18/2023  Last Visit: 10/17/2022  Last Fill: 10/17/2022  Dx: Seronegative rheumatoid arthritis   Current Dose per office note on 10/17/2022: tizanidine 4 mg at night   Okay to refill Tizanidine?

## 2023-01-17 ENCOUNTER — Encounter: Payer: Self-pay | Admitting: Physician Assistant

## 2023-01-17 ENCOUNTER — Ambulatory Visit: Payer: 59 | Attending: Physician Assistant | Admitting: Physician Assistant

## 2023-01-17 VITALS — BP 128/70 | HR 76 | Ht 64.5 in | Wt 251.3 lb

## 2023-01-17 DIAGNOSIS — R002 Palpitations: Secondary | ICD-10-CM

## 2023-01-17 DIAGNOSIS — G4733 Obstructive sleep apnea (adult) (pediatric): Secondary | ICD-10-CM | POA: Diagnosis not present

## 2023-01-17 DIAGNOSIS — R0602 Shortness of breath: Secondary | ICD-10-CM | POA: Diagnosis not present

## 2023-01-17 DIAGNOSIS — R252 Cramp and spasm: Secondary | ICD-10-CM

## 2023-01-17 DIAGNOSIS — I2510 Atherosclerotic heart disease of native coronary artery without angina pectoris: Secondary | ICD-10-CM

## 2023-01-17 DIAGNOSIS — E785 Hyperlipidemia, unspecified: Secondary | ICD-10-CM | POA: Diagnosis not present

## 2023-01-17 DIAGNOSIS — J0141 Acute recurrent pansinusitis: Secondary | ICD-10-CM | POA: Diagnosis not present

## 2023-01-17 DIAGNOSIS — I10 Essential (primary) hypertension: Secondary | ICD-10-CM

## 2023-01-17 DIAGNOSIS — Z03818 Encounter for observation for suspected exposure to other biological agents ruled out: Secondary | ICD-10-CM | POA: Diagnosis not present

## 2023-01-17 MED ORDER — AMLODIPINE BESYLATE 5 MG PO TABS: 5.0000 mg | ORAL_TABLET | Freq: Every day | ORAL | 1 refills | Status: DC

## 2023-01-17 NOTE — Patient Instructions (Signed)

## 2023-01-26 ENCOUNTER — Telehealth: Payer: Self-pay

## 2023-01-26 NOTE — Telephone Encounter (Signed)
Mailed [employer] form to home address per patient request.

## 2023-01-27 DIAGNOSIS — Z79899 Other long term (current) drug therapy: Secondary | ICD-10-CM | POA: Diagnosis not present

## 2023-01-30 DIAGNOSIS — J8410 Pulmonary fibrosis, unspecified: Secondary | ICD-10-CM | POA: Diagnosis not present

## 2023-02-09 ENCOUNTER — Encounter: Payer: Self-pay | Admitting: Pulmonary Disease

## 2023-02-16 DIAGNOSIS — E039 Hypothyroidism, unspecified: Secondary | ICD-10-CM | POA: Diagnosis not present

## 2023-02-22 ENCOUNTER — Encounter: Payer: Self-pay | Admitting: Pulmonary Disease

## 2023-02-22 DIAGNOSIS — R053 Chronic cough: Secondary | ICD-10-CM | POA: Diagnosis not present

## 2023-02-22 DIAGNOSIS — R059 Cough, unspecified: Secondary | ICD-10-CM | POA: Diagnosis not present

## 2023-02-22 DIAGNOSIS — J849 Interstitial pulmonary disease, unspecified: Secondary | ICD-10-CM | POA: Diagnosis not present

## 2023-02-28 DIAGNOSIS — J8410 Pulmonary fibrosis, unspecified: Secondary | ICD-10-CM | POA: Diagnosis not present

## 2023-03-23 ENCOUNTER — Telehealth: Payer: Self-pay | Admitting: Pulmonary Disease

## 2023-03-23 DIAGNOSIS — J8410 Pulmonary fibrosis, unspecified: Secondary | ICD-10-CM

## 2023-03-23 NOTE — Telephone Encounter (Signed)
Per Dr. Patsey Berthold verbally, order ONO on [facility]air. Patient is aware and voiced her understanding.  ONO ordered. Nothing further needed.

## 2023-03-23 NOTE — Telephone Encounter (Signed)
Patient states discontinued oxygen. Would like oxygen and equipment picked up. Uses [employer]. Patient phone number is [phone].

## 2023-03-23 NOTE — Telephone Encounter (Signed)
Called and spoke to patient. She is requesting order to d/c nocturnal oxygen due to billing issues with [employer]. She stated that she wears it nightly but she can not justify paying the amount that she pays for it.    Dr. Patsey Berthold, please advise.

## 2023-03-23 NOTE — Telephone Encounter (Signed)
Agree with recommendations as discussed.

## 2023-03-23 NOTE — Telephone Encounter (Signed)
She is not wearing cpap, as she could not tolerate it. She returned machine.

## 2023-03-31 DIAGNOSIS — J8410 Pulmonary fibrosis, unspecified: Secondary | ICD-10-CM | POA: Diagnosis not present

## 2023-04-13 ENCOUNTER — Other Ambulatory Visit: Payer: Self-pay | Admitting: Internal Medicine

## 2023-04-13 DIAGNOSIS — M542 Cervicalgia: Secondary | ICD-10-CM

## 2023-04-13 NOTE — Telephone Encounter (Signed)
Last Fill: 01/16/2023  Next Visit: 04/18/2023  Last Visit: 10/18/2023  Dx: Chronic neck and back pain   Current Dose per office note on 10/17/2022: tizanidine 4 mg at night particular to help with the pain and sleep disruption.   Okay to refill Tizanidine?

## 2023-04-17 DIAGNOSIS — I10 Essential (primary) hypertension: Secondary | ICD-10-CM | POA: Diagnosis not present

## 2023-04-17 DIAGNOSIS — N1831 Chronic kidney disease, stage 3a: Secondary | ICD-10-CM | POA: Diagnosis not present

## 2023-04-18 ENCOUNTER — Ambulatory Visit: Payer: 59 | Attending: Internal Medicine | Admitting: Internal Medicine

## 2023-04-18 ENCOUNTER — Ambulatory Visit (INDEPENDENT_AMBULATORY_CARE_PROVIDER_SITE_OTHER): Payer: 59

## 2023-04-18 ENCOUNTER — Encounter: Payer: Self-pay | Admitting: Internal Medicine

## 2023-04-18 VITALS — BP 117/80 | HR 69 | Resp 14 | Ht 64.0 in | Wt 246.0 lb

## 2023-04-18 DIAGNOSIS — J320 Chronic maxillary sinusitis: Secondary | ICD-10-CM

## 2023-04-18 DIAGNOSIS — G8929 Other chronic pain: Secondary | ICD-10-CM

## 2023-04-18 DIAGNOSIS — R768 Other specified abnormal immunological findings in serum: Secondary | ICD-10-CM

## 2023-04-18 DIAGNOSIS — M549 Dorsalgia, unspecified: Secondary | ICD-10-CM

## 2023-04-18 DIAGNOSIS — M1990 Unspecified osteoarthritis, unspecified site: Secondary | ICD-10-CM

## 2023-04-18 DIAGNOSIS — M542 Cervicalgia: Secondary | ICD-10-CM

## 2023-04-18 NOTE — Progress Notes (Signed)
Office Visit Note  Patient: Bianca Michael             Date of Birth: 05/24/1966           MRN: [id number]             PCP: Mick Sell, MD Referring: Mick Sell, MD Visit Date: 04/18/2023   Subjective:  Follow-up   History of Present Illness: Bianca Michael is a 57 y.o. 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  Overall doing somewhat worse recently, with hand stiffness daily although better than when not treated at all.  She has been dealing with a difficult to clear sinusitis has done 3 rounds of antibiotics for this.  Has previously had issues with nasal polyps and called to resolve upper respiratory infections before.  Because joint pain issues right now are in the neck and throughout her back  Previous HPI 10/17/22 Bianca Michael is a 57 y.o. 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  Symptoms have been pretty stable since her last visit still feels like she is seeing an improvement in peripheral joint pain and stiffness.  Currently biggest issues in the neck low back and hip areas.  These have never shown significant improvement so far since starting hydroxychloroquine.   Previous HPI 04/11/2022 Bianca Michael is a 57 y.o. 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 Overall has been doing pretty well but for past about 3 weeks has increased pain and stiffness in right hand and left elbow. Unable to fully extend and flex her left 3rd finger with some triggering. Left elbow feels okay in flexed position some more pain when extending and with rotational movements. She is taking tylenol as needed in addition to her hydroxychloroquine which helps partially.    Previous HPI 10/11/21 Bianca Michael is a 57 y.o. female here for follow up for seronegative arthritis after starting hydroxychloroquine 400 mg PO daily. At previous visit  objective inflammation at the wrist suggestive for dequervains but also polyarthralgia and fatigue. Hand and wrist pain improved a lot now able to make tight fists, but has had some increase overall she associates with timing of weather changes. She sustained a fall after walking for a long time at the mall with no good location to sit for a break and struck the right side of her head. Currently bilateral shoulder pain is worse, especially on left. This has woken her from sleep a few times. No numbness down the arms.     Previous HPI: 07/26/21 Bianca Michael is a 57 y.o. female here for evaluation of positive ANA with recent symptoms of joint pains, pulmonary embolus on xarelto, and peripheral ischemia in toes transiently attributed to vasopressor treatment with loss of toenails. 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 Overall she has been feeling in about usual health till becoming acutely ill earlier this year.  She does recall some bronchitis type symptoms preceding the January COVID illness and hospitalization.  She does not recall much from that stay since she experienced significant portion of 2 months unconscious in ICU requiring tracheostomy for ventilator support after pulmonary embolus and collapsed lung.  After initially in the hospital she required extensive physical therapy with inability to walk or even transfer independently.  She does feel  she has been more reliant on her upper extremity mobility during the initial period due to severe leg weakness. Currently her worst affected area is the right wrist with ongoing pain and swelling and decreased ability to flex and extend without pain. This has been persistently swollen for weeks, the left wrist hurts just intermittently and without visible swelling. She does have family history with first-degree relative and carrying diagnosis of lupus who has been treated  with Plaquenil and prednisone.   Labs reviewed 06/2021 ANA pos dsDNA 13 RNP, Smith, SSA, SSB, chromatin, Jo-1, centromere neg RF 11.3 Uric acid 7.1 B2GP1 IgA/IgM/IgG neg   03/2021 B2GP1 IgG 60 ACA neg LA neg   Imaging reviewed 07/21/21 Xray right knee 4 views Mild osteoarthritis appears most advanced in the medial compartments. 07/21/21 Xray left knee 4 views Mild osteoarthritis appears most advanced in the medial compartments. 07/21/21 Xray right wrist No acute finding. Mild joint space narrowing at the radiocarpal joint in the region of the radial styloid and navicular consistent with mild osteoarthritis. 07/21/21 Xray chest 2 views Mild bronchial thickening. Streaky opacity at the left lung base, may be atelectasis or scarring. No confluent consolidation.   Review of Systems  Constitutional:  Positive for fatigue.  HENT:  Positive for mouth dryness. Negative for mouth sores.   Eyes:  Positive for dryness.  Respiratory:  Positive for shortness of breath.   Cardiovascular:  Negative for chest pain and palpitations.  Gastrointestinal:  Negative for blood in stool, constipation and diarrhea.  Endocrine: Positive for increased urination.  Genitourinary:  Positive for involuntary urination.  Musculoskeletal:  Positive for joint pain, joint pain, joint swelling, myalgias, muscle weakness, morning stiffness, muscle tenderness and myalgias. Negative for gait problem.  Skin:  Positive for sensitivity to sunlight. Negative for color change, rash and hair loss.  Allergic/Immunologic: Positive for susceptible to infections.  Neurological:  Positive for dizziness. Negative for headaches.  Hematological:  Negative for swollen glands.  Psychiatric/Behavioral:  Positive for depressed mood and sleep disturbance. The patient is nervous/anxious.     PMFS History:  Patient Active Problem List   Diagnosis Date Noted   Iron deficiency 10/17/2022   Elevated serum creatinine 08/16/2021   Morbid  obesity (HCC) 08/16/2021   Positive ANA (antinuclear antibody) 07/26/2021   Inflammatory arthritis 07/21/2021   Screening for heart disease 07/21/2021   COVID-19 07/21/2021   History of pulmonary embolism 07/21/2021   Anxiety 07/21/2021   Acute pulmonary embolism without acute cor pulmonale (HCC) 04/07/2021   Elevated liver enzymes 01/28/2021   Hyperlipidemia 01/21/2021   Chronic neck and back pain 03/24/2020   Essential (primary) hypertension 03/13/2020   Major depressive disorder 03/13/2020   Chronic ethmoidal sinusitis 04/02/2019   Chronic frontal sinusitis 04/02/2019   Chronic maxillary sinusitis 04/02/2019   Nasal polyp 04/02/2019   Thyroid disease 06/19/2018    Past Medical History:  Diagnosis Date   Anxiety    Chronic kidney disease    COVID-19 12/2020   Patient was hospitalized   Depression    Hyperlipidemia    Hypertension    Inflammatory arthritis    Pneumothorax    Left lung   Pulmonary embolism (HCC)    Thyroid disease     Family History  Problem Relation Age of Onset   Breast cancer Mother 28   Colon cancer Mother    Heart attack Father    Heart disease Father    High Cholesterol Father    Thyroid disease Sister    Breast  cancer Sister    Thyroid disease Sister    Thyroid disease Sister    Lupus Sister    Rheum arthritis Sister    Thyroid disease Daughter    Past Surgical History:  Procedure Laterality Date   CARPAL TUNNEL RELEASE     CHOLECYSTECTOMY     TONSILLECTOMY  1970   TUBAL LIGATION     Social History   Social History Narrative   Not on file   Immunization History  Administered Date(s) Administered   Hep A / Hep B 12/14/2021, 01/31/2022     Objective: Vital Signs: BP 117/80 (BP Location: Left Arm, Patient Position: Sitting, Cuff Size: Normal)   Pulse 69   Resp 14   Ht 5\' 4"  (1.626 m)   Wt 246 lb (111.6 kg)   BMI 42.23 kg/m    Physical Exam Constitutional:      Appearance: She is obese.  HENT:     Nose: Nose normal.   Eyes:     Conjunctiva/sclera: Conjunctivae normal.  Cardiovascular:     Rate and Rhythm: Normal rate and regular rhythm.  Pulmonary:     Effort: Pulmonary effort is normal.     Breath sounds: Normal breath sounds.  Lymphadenopathy:     Cervical: No cervical adenopathy.  Skin:    General: Skin is warm and dry.     Findings: No rash.  Neurological:     Mental Status: She is alert.  Psychiatric:        Mood and Affect: Mood normal.     Musculoskeletal Exam:  Neck full ROM but some pain with lateral rotation both sides Shoulders full ROM, tenderness to pressure right side without swelling Elbows full ROM no tenderness or swelling Wrists full ROM no tenderness or swelling  Knees full ROM no swelling, bilateral patellofemoral crepitus present Ankles full ROM no tenderness or swelling  CDAI Exam: CDAI Score: 4  Patient Global: 20 mm; Provider Global: 10 mm Swollen: 0 ; Tender: 1  Joint Exam 04/18/2023      Right  Left  Glenohumeral   Tender        Investigation: No additional findings.  Imaging: XR Cervical Spine 2 or 3 views  Result Date: 05/17/2023 X-ray cervical spine 2 views AP and lateral Multilevel degenerative changes with anterior osteophytes at multiple levels starting from C2.  There are also a few posterior and lateral osteophyte processes seen.  Possible slight spondylolisthesis seen with C3.  Straightening of the normal cervical curvature.  No substantial disc height loss and no acute appearing abnormality. Multilevel degenerative arthritis with no acute abnormality   Recent Labs: Lab Results  Component Value Date   WBC 4.5 10/17/2022   HGB 15.1 10/17/2022   PLT 203 10/17/2022   NA 141 10/17/2022   K 4.5 10/17/2022   CL 104 10/17/2022   CO2 28 10/17/2022   GLUCOSE 85 10/17/2022   BUN 12 10/17/2022   CREATININE 1.24 (H) 10/17/2022   BILITOT 0.5 10/17/2022   ALKPHOS 106 02/23/2022   AST 29 10/17/2022   ALT 42 (H) 10/17/2022   PROT 7.4 10/17/2022    ALBUMIN 4.4 02/23/2022   CALCIUM 10.0 10/17/2022    Speciality Comments: PLQ Eye Exam 02/18/2023 WNL [facility] f/u 12 months  Procedures:  No procedures performed Allergies: Patient has no known allergies.   Assessment / Plan:     Visit Diagnoses: Chronic neck and back pain - Plan: XR Cervical Spine 2 or 3 views  Current worse  symptoms in axial distribution discussed do not think this is related to her rheumatoid arthritis which would be uncommon site of involvement, does not have highly active disease systemically, and imaging has multilevel degenerative arthritis that may better account for the symptoms.  I think she could benefit to see neurosurgery clinic for additional treatment options with chronic neck pain even if taking relatively conservative approach for now anyways.  Inflammatory arthritis  Peripheral joint inflammation does not look bad on exam today though she is describing increased could be related with her difficult to clear upper respiratory infection increasing disease activity somewhat.  She just had recent lab work yesterday including blood count and kidney function that looked fine.  Ophthalmology exam for retinal toxicity screening from February was fine.  Plan to continue hydroxychloroquine 400 mg daily.  Chronic maxillary sinusitis  Very slow to resolve sinusitis but this has apparently been pretty normal for even not on any type of RA medication.  Orders: Orders Placed This Encounter  Procedures   XR Cervical Spine 2 or 3 views   No orders of the defined types were placed in this encounter.    Follow-Up Instructions: Return in about 6 months (around 10/18/2023) for RA on HCQ f/u 6mos.   Fuller Plan, MD  Note - This record has been created using [employer].  Chart creation errors have been sought, but may not always  have been located. Such creation errors do not reflect on  the standard of medical care.

## 2023-04-21 ENCOUNTER — Other Ambulatory Visit: Payer: Self-pay | Admitting: Physician Assistant

## 2023-04-28 ENCOUNTER — Other Ambulatory Visit: Payer: Self-pay | Admitting: Internal Medicine

## 2023-04-28 DIAGNOSIS — M1380 Other specified arthritis, unspecified site: Secondary | ICD-10-CM

## 2023-04-30 DIAGNOSIS — J8410 Pulmonary fibrosis, unspecified: Secondary | ICD-10-CM | POA: Diagnosis not present

## 2023-05-01 NOTE — Telephone Encounter (Signed)
Last Fill: 10/17/2022  Eye exam: 02/18/2023 WNL  Labs: 04/17/2023 CBC w/Diff RBC 5.18, MCH 26.8, 10/17/2022 Lab results look okay for continuing hydroxychloroquine. One liver function test the ALT is mildly elevated but barely so. Her sedimentation rate is normal. Iron levels are now normal as well.   Next Visit: 10/18/2023  Last Visit: 04/18/2023  DX:  Inflammatory arthritis  Current Dose per office note 04/18/2023: hydroxychloroquine 400 mg daily   Okay to refill Plaquenil?

## 2023-05-09 DIAGNOSIS — E785 Hyperlipidemia, unspecified: Secondary | ICD-10-CM | POA: Diagnosis not present

## 2023-05-09 DIAGNOSIS — M549 Dorsalgia, unspecified: Secondary | ICD-10-CM | POA: Diagnosis not present

## 2023-05-09 DIAGNOSIS — G4733 Obstructive sleep apnea (adult) (pediatric): Secondary | ICD-10-CM | POA: Diagnosis not present

## 2023-05-09 DIAGNOSIS — F32A Depression, unspecified: Secondary | ICD-10-CM | POA: Diagnosis not present

## 2023-05-09 DIAGNOSIS — J849 Interstitial pulmonary disease, unspecified: Secondary | ICD-10-CM | POA: Diagnosis not present

## 2023-05-09 DIAGNOSIS — M1990 Unspecified osteoarthritis, unspecified site: Secondary | ICD-10-CM | POA: Diagnosis not present

## 2023-05-09 DIAGNOSIS — I10 Essential (primary) hypertension: Secondary | ICD-10-CM | POA: Diagnosis not present

## 2023-05-09 DIAGNOSIS — E611 Iron deficiency: Secondary | ICD-10-CM | POA: Diagnosis not present

## 2023-05-09 DIAGNOSIS — Z86711 Personal history of pulmonary embolism: Secondary | ICD-10-CM | POA: Diagnosis not present

## 2023-05-09 DIAGNOSIS — R739 Hyperglycemia, unspecified: Secondary | ICD-10-CM | POA: Diagnosis not present

## 2023-05-09 DIAGNOSIS — Z0000 Encounter for general adult medical examination without abnormal findings: Secondary | ICD-10-CM | POA: Diagnosis not present

## 2023-05-09 DIAGNOSIS — I2510 Atherosclerotic heart disease of native coronary artery without angina pectoris: Secondary | ICD-10-CM | POA: Diagnosis not present

## 2023-05-09 DIAGNOSIS — M542 Cervicalgia: Secondary | ICD-10-CM | POA: Diagnosis not present

## 2023-05-09 DIAGNOSIS — G8929 Other chronic pain: Secondary | ICD-10-CM | POA: Diagnosis not present

## 2023-05-09 DIAGNOSIS — E079 Disorder of thyroid, unspecified: Secondary | ICD-10-CM | POA: Diagnosis not present

## 2023-05-11 ENCOUNTER — Encounter: Payer: Self-pay | Admitting: Pulmonary Disease

## 2023-05-11 ENCOUNTER — Ambulatory Visit: Payer: 59 | Admitting: Pulmonary Disease

## 2023-05-11 VITALS — BP 118/72 | HR 76 | Temp 98.0°F | Ht 64.0 in | Wt 244.8 lb

## 2023-05-11 DIAGNOSIS — E669 Obesity, unspecified: Secondary | ICD-10-CM

## 2023-05-11 DIAGNOSIS — Z6841 Body Mass Index (BMI) 40.0 and over, adult: Secondary | ICD-10-CM | POA: Diagnosis not present

## 2023-05-11 DIAGNOSIS — G4736 Sleep related hypoventilation in conditions classified elsewhere: Secondary | ICD-10-CM | POA: Diagnosis not present

## 2023-05-11 DIAGNOSIS — R0602 Shortness of breath: Secondary | ICD-10-CM

## 2023-05-11 DIAGNOSIS — J386 Stenosis of larynx: Secondary | ICD-10-CM

## 2023-05-11 DIAGNOSIS — R490 Dysphonia: Secondary | ICD-10-CM | POA: Diagnosis not present

## 2023-05-11 DIAGNOSIS — J8410 Pulmonary fibrosis, unspecified: Secondary | ICD-10-CM

## 2023-05-11 NOTE — Progress Notes (Addendum)
Subjective:    Patient ID: Bianca Michael, female    DOB: 09/16/1966, 57 y.o.   MRN: [id number] Patient Care Team: Mick Sell, MD as PCP - General (Infectious Diseases) Debbe Odea, MD as PCP - Cardiology (Cardiology) Salena Saner, MD as Consulting Physician (Pulmonary Disease)  Chief Complaint  Patient presents with   Follow-up    SOB with exertion. No wheezing or cough.    HPI Patient is a 57 year old remote former smoker who follows up for the issue of dyspnea after prolonged hospitalization for COVID-19 in January 2022 with intubation and subsequent tracheostomy.  After removal of her tracheostomy the patient has had persistent hoarseness.  She has had studies as noted below she has mild postinflammatory pulmonary fibrosis.  Her PFTs were mostly restrictive due to postinflammatory pulmonary fibrosis and obesity.  She has been using Stiolto which initially helped with cough and shortness of breath but now has become ineffective.  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  The right true cord was normal.  She was referred to speech pathology however she did not follow due to the time commitment.  She was also referred to pulmonary rehabilitation however felt that it was too expensive and she declined.  It appears that when she exerts herself her main issue is shortness of breath on inspiration (it is hard to get a breath in).  This raises the possibility that she may have some exercise-induced laryngeal obstruction from her prior issues during hospitalization for COVID.   She was diagnosed with mild obstructive sleep apnea (AHI of 9) in December 2022 however she cannot tolerate CPAP.  Previously she had been on nocturnal oxygen she continues to use this.  She had humidity added to the concentrator and she has increased her liter flow to drive a humidifier.  She is  currently on 3 L/min.  She notes restful sleep with this.  She is compliant with the therapy.  At the direction of her primary care physician she has started [facility] in an attempt to engage in weight loss.  She apparently had an overnight oximetry a few days ago but the results are not back.  She is due for reassessment with PFTs.   DATA: 09/07/2021  PFTs: FEV1 2.28 L or 86% predicted, FVC 2.57 L or 75% predicted, FEV1/FVC 89%, no bronchodilator response.  There is mild restrictive physiology due to ILD and/or obesity (ERV 24%) diffusion capacity was normal. 09/21/2021 CT chest high-resolution: Possible early interstitial lung disease areas of architectural distortion and groundglass attenuation likely representing infectious or inflammatory scarring.  Mild bronchiectasis. Evidence of three-vessel coronary artery disease.  Severe hepatic steatosis. 10/07/2021 2D echo: LVEF 60 to 65%, mild LVH.  Grade 1 DD.  Mild mitral regurg. 11/01/2021 Myoview: Normal stress test. 11/27/2021 Home sleep study: Mild obstructive sleep apnea with AHI of 9 and SPO2 low of 84%.  Review of Systems A 10 point review of systems was performed and it is as noted above otherwise negative.  Patient Active Problem List   Diagnosis Date Noted   Iron deficiency 10/17/2022   Elevated serum creatinine 08/16/2021   Morbid obesity (HCC) 08/16/2021   Positive ANA (antinuclear antibody) 07/26/2021   Inflammatory arthritis 07/21/2021   Screening for heart disease 07/21/2021   COVID-19 07/21/2021   History of pulmonary embolism 07/21/2021   Anxiety 07/21/2021   Acute pulmonary embolism without acute  cor pulmonale (HCC) 04/07/2021   Elevated liver enzymes 01/28/2021   Hyperlipidemia 01/21/2021   Chronic neck and back pain 03/24/2020   Essential (primary) hypertension 03/13/2020   Major depressive disorder 03/13/2020   Chronic ethmoidal sinusitis 04/02/2019   Chronic frontal sinusitis 04/02/2019   Chronic maxillary  sinusitis 04/02/2019   Nasal polyp 04/02/2019   Thyroid disease 06/19/2018   Social History   Tobacco Use   Smoking status: Former    Packs/day: 1.00    Years: 10.00    Additional pack years: 0.00    Total pack years: 10.00    Types: Cigarettes    Quit date: 12/26/2000    Years since quitting: 22.3    Passive exposure: Current   Smokeless tobacco: Never  Substance Use Topics   Alcohol use: Yes    Comment: very rarely   No Known Allergies  Current Meds  Medication Sig   albuterol (VENTOLIN HFA) 108 (90 Base) MCG/ACT inhaler Inhale into the lungs.   amLODipine (NORVASC) 5 MG tablet Take 1 tablet (5 mg total) by mouth daily.   Ascorbic Acid (VITAMIN C) 1000 MG tablet Take 1,000 mg by mouth daily.   Cholecalciferol (VITAMIN D3 ULTRA STRENGTH PO) Take 5,000 Int'l Units/day by mouth.   clopidogrel (PLAVIX) 75 MG tablet Take 1 tablet (75 mg total) by mouth daily. PLEASE SCHEDULE OFFICE VISIT FOR FURTHER REFILLS. THANK YOU!   DENTAGEL 1.1 % GEL dental gel Take by mouth.   hydroxychloroquine (PLAQUENIL) 200 MG tablet TAKE 2 TABLETS BY MOUTH EVERY DAY   levothyroxine (SYNTHROID) 100 MCG tablet    liothyronine (CYTOMEL) 5 MCG tablet Take by mouth.   losartan (COZAAR) 100 MG tablet Take 100 mg by mouth daily.   magnesium oxide (MAG-OX) 400 MG tablet Take by mouth.   MILK THISTLE PO Take 1 capsule by mouth daily.   Multiple Vitamins-Minerals (WOMENS MULTI) CAPS Take 1 capsule by mouth daily.   naltrexone (DEPADE) 50 MG tablet Take 50 mg by mouth daily.   omeprazole (PRILOSEC) 40 MG capsule TAKE 1 CAPSULE (40 MG TOTAL) BY MOUTH DAILY.   Red Yeast Rice Extract (RED YEAST RICE PO) Take 2 capsules by mouth daily.   Semaglutide-Weight Management 0.25 MG/0.5ML SOAJ Inject into the skin. Inject 0.5 mLs (0.25 mg total) subcutaneously once a week   Tiotropium Bromide-Olodaterol (STIOLTO RESPIMAT) 2.5-2.5 MCG/ACT AERS Inhale 2 puffs into the lungs daily.   tiZANidine (ZANAFLEX) 4 MG tablet TAKE 1  TABLET BY MOUTH EVERYDAY AT BEDTIME   zinc gluconate 50 MG tablet Take 50 mg by mouth daily.   Immunization History  Administered Date(s) Administered   Hep A / Hep B 12/14/2021, 01/31/2022       Objective:   Physical Exam BP 118/72 (BP Location: Left Arm, Cuff Size: Large)   Pulse 76   Temp 98 F (36.7 C)   Ht 5\' 4"  (1.626 m)   Wt 244 lb 12.8 oz (111 kg)   SpO2 95%   BMI 42.02 kg/m   SpO2: 95 % O2 Device: None (Room air)  GENERAL: Obese woman, no acute distress, fully ambulatory, raspy voice, no conversational dyspnea.  Voice appears clearer than on prior visit. HEAD: Normocephalic, atraumatic.  EYES: Pupils equal, round, reactive to light.  No scleral icterus.  MOUTH: Nose/mouth/throat not examined due to masking requirements for COVID 19. NECK: Supple. No thyromegaly. Trachea midline. No JVD.  No adenopathy.  Well-healed tracheostomy scar. PULMONARY: Good air entry bilaterally.  No adventitious sounds. CARDIOVASCULAR: S1 and  S2. Regular rate and rhythm.  Grade 1/6 mitral regurgitation murmur. ABDOMEN: Obese, well-healed PEG scar. MUSCULOSKELETAL: No joint deformity, no clubbing, no edema.  NEUROLOGIC: No focal deficit, no gait disturbance, speech is fluent. SKIN: Intact,warm,dry.  Mild discoloration of the toes distally, no frank ischemia.  Regrowing toenails. PSYCH: Mood and behavior normal       Assessment & Plan:     ICD-10-CM   1. Shortness of breath  R06.02 Pulmonary Function Test [facility] Only   Reassess with PFTs Have recommended pulmonary rehab Patient wants to wait till she has better insurance Discontinue Stiolto-ineffective    2. Postinflammatory pulmonary fibrosis (HCC)  J84.10 Pulmonary Function Test [facility] Only   Reassess with PFTs Clinically no progression    3. 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 Exercise induced laryngeal obstruction (EILO) -rule out  J38.6 Pulmonary Function Test [facility] Only   Suspect this  is an issue for her May need referral to specialized ENT Center    5. Nocturnal hypoxemia due to obesity  E66.9    G47.36    Recent overnight oximetry pending    6. Morbid obesity with BMI of 40.0-44.9, adult (HCC)  E66.01    Z68.41    Patient has started [facility] Being managed by primary care This issue adds to her dyspnea difficulties     Orders Placed This Encounter  Procedures   Pulmonary Function Test [facility] Only    Standing Status:   Future    Standing Expiration Date:   05/10/2024    Scheduling Instructions:     Sooner rather than later please    Order Specific Question:   Full PFT: includes the following: basic spirometry, spirometry pre & post bronchodilator, diffusion capacity (DLCO), lung volumes    Answer:   Full PFT    Order Specific Question:   This test can only be performed at    Answer:   [facility]   Will see the patient in follow-up in 2 to 3 months time she is to contact us prior to that time should any new difficulties arise.  Gailen Shelter, MD Advanced Bronchoscopy [facility]    *This note was dictated using voice recognition software/Dragon.  Despite best efforts to proofread, errors can occur which can change the meaning. Any transcriptional errors that result from this process are unintentional and may not be fully corrected at the time of dictation.

## 2023-05-11 NOTE — Patient Instructions (Signed)
We will call you with the results of your overnight oximetry.  We are procuring breathing test we will try to get these done before the end of June.  We will see you in follow-up in 2 to 3 months time call sooner should any new problems arise.

## 2023-05-13 ENCOUNTER — Encounter: Payer: Self-pay | Admitting: Pulmonary Disease

## 2023-05-15 MED ORDER — ALBUTEROL SULFATE HFA 108 (90 BASE) MCG/ACT IN AERS: 2.0000 | INHALATION_SPRAY | RESPIRATORY_TRACT | 2 refills | Status: AC | PRN

## 2023-05-15 NOTE — Telephone Encounter (Signed)
Okay to refill albuterol. 

## 2023-05-15 NOTE — Telephone Encounter (Signed)
Dr. Jayme Cloud, please advise if okay to order albuterol. Not originally prescribed by our office.

## 2023-05-17 ENCOUNTER — Other Ambulatory Visit: Payer: Self-pay | Admitting: *Deleted

## 2023-05-17 DIAGNOSIS — M1990 Unspecified osteoarthritis, unspecified site: Secondary | ICD-10-CM

## 2023-05-17 DIAGNOSIS — G8929 Other chronic pain: Secondary | ICD-10-CM

## 2023-05-19 ENCOUNTER — Telehealth: Payer: Self-pay

## 2023-05-19 NOTE — Telephone Encounter (Signed)
ONO reviewed by Dr. Rockey Situ 2L [facility].  Patient is aware of results and voiced her understanding. She will continue oxygen nightly.  Nothing further needed.

## 2023-05-31 DIAGNOSIS — J8410 Pulmonary fibrosis, unspecified: Secondary | ICD-10-CM | POA: Diagnosis not present

## 2023-06-01 ENCOUNTER — Encounter: Payer: Self-pay | Admitting: Pulmonary Disease

## 2023-06-05 NOTE — Progress Notes (Unsigned)
Referring Physician:  Fuller Plan, MD [address] [place],  [place] [address]  Primary Physician:  Mick Sell, MD  History of Present Illness: 06/05/2023*** Ms. Bianca Michael has a history of obesity, RA, history of PE when she had covid, hyperlipidemia, HTN, pulmonary fibrosis and she is on O2.   Referred by rheumatology for neck and LBP.      Duration: *** Location: *** Quality: *** Severity: ***  Precipitating: aggravated by *** Modifying factors: made better by *** Weakness: none Timing: *** Bowel/Bladder Dysfunction: none  Conservative measures:  Physical therapy: ***  Multimodal medical therapy including regular antiinflammatories: ***  Injections: *** epidural steroid injections  Past Surgery: ***  Bianca Michael has ***no symptoms of cervical myelopathy.  The symptoms are causing a significant impact on the patient's life.   Review of Systems:  A 10 point review of systems is negative, except for the pertinent positives and negatives detailed in the HPI.  Past Medical History: Past Medical History:  Diagnosis Date   Anxiety    Chronic kidney disease    COVID-19 12/2020   Patient was hospitalized   Depression    Hyperlipidemia    Hypertension    Inflammatory arthritis    Pneumothorax    Left lung   Pulmonary embolism (HCC)    Thyroid disease     Past Surgical History: Past Surgical History:  Procedure Laterality Date   CARPAL TUNNEL RELEASE     CHOLECYSTECTOMY     TONSILLECTOMY  1970   TUBAL LIGATION      Allergies: Allergies as of 06/08/2023   (No Known Allergies)    Medications: Outpatient Encounter Medications as of 06/08/2023  Medication Sig   albuterol (VENTOLIN HFA) 108 (90 Base) MCG/ACT inhaler Inhale 2 puffs into the lungs every 4 (four) hours as needed for wheezing or shortness of breath.   amLODipine (NORVASC) 5 MG tablet Take 1 tablet (5 mg total) by mouth daily.   Ascorbic Acid  (VITAMIN C) 1000 MG tablet Take 1,000 mg by mouth daily.   buPROPion HCl ER, XL, 450 MG TB24 Take by mouth.   Cholecalciferol (VITAMIN D3 ULTRA STRENGTH PO) Take 5,000 Int'l Units/day by mouth.   clopidogrel (PLAVIX) 75 MG tablet Take 1 tablet (75 mg total) by mouth daily. PLEASE SCHEDULE OFFICE VISIT FOR FURTHER REFILLS. THANK YOU!   DENTAGEL 1.1 % GEL dental gel Take by mouth.   hydroxychloroquine (PLAQUENIL) 200 MG tablet TAKE 2 TABLETS BY MOUTH EVERY DAY   levothyroxine (SYNTHROID) 100 MCG tablet    liothyronine (CYTOMEL) 5 MCG tablet Take by mouth.   losartan (COZAAR) 100 MG tablet Take 100 mg by mouth daily.   magnesium oxide (MAG-OX) 400 MG tablet Take by mouth.   MILK THISTLE PO Take 1 capsule by mouth daily.   Multiple Vitamins-Minerals (WOMENS MULTI) CAPS Take 1 capsule by mouth daily.   naltrexone (DEPADE) 50 MG tablet Take 50 mg by mouth daily.   omeprazole (PRILOSEC) 40 MG capsule TAKE 1 CAPSULE (40 MG TOTAL) BY MOUTH DAILY.   Red Yeast Rice Extract (RED YEAST RICE PO) Take 2 capsules by mouth daily.   Semaglutide-Weight Management 0.25 MG/0.5ML SOAJ Inject into the skin. Inject 0.5 mLs (0.25 mg total) subcutaneously once a week   Tiotropium Bromide-Olodaterol (STIOLTO RESPIMAT) 2.5-2.5 MCG/ACT AERS Inhale 2 puffs into the lungs daily.   tiZANidine (ZANAFLEX) 4 MG tablet TAKE 1 TABLET BY MOUTH EVERYDAY AT BEDTIME   zinc gluconate 50 MG  tablet Take 50 mg by mouth daily.   No facility-administered encounter medications on file as of 06/08/2023.    Social History: Social History   Tobacco Use   Smoking status: Former    Packs/day: 1.00    Years: 10.00    Additional pack years: 0.00    Total pack years: 10.00    Types: Cigarettes    Quit date: 12/26/2000    Years since quitting: 22.4    Passive exposure: Current   Smokeless tobacco: Never  Vaping Use   Vaping Use: Never used  Substance Use Topics   Alcohol use: Yes    Comment: very rarely   Drug use: Not Currently     Family Medical History: Family History  Problem Relation Age of Onset   Breast cancer Mother 31   Colon cancer Mother    Heart attack Father    Heart disease Father    High Cholesterol Father    Thyroid disease Sister    Breast cancer Sister    Thyroid disease Sister    Thyroid disease Sister    Lupus Sister    Rheum arthritis Sister    Thyroid disease Daughter     Physical Examination: There were no vitals filed for this visit.  General: Patient is well developed, well nourished, calm, collected, and in no apparent distress. Attention to examination is appropriate.  Respiratory: Patient is breathing without any difficulty.   NEUROLOGICAL:     Awake, alert, oriented to person, place, and time.  Speech is clear and fluent. Fund of knowledge is appropriate.   Cranial Nerves: Pupils equal round and reactive to light.  Facial tone is symmetric.    *** ROM of cervical spine *** pain *** posterior cervical tenderness. *** tenderness in bilateral trapezial region.   *** ROM of lumbar spine *** pain *** posterior lumbar tenderness.   No abnormal lesions on exposed skin.   Strength: Side Biceps Triceps Deltoid Interossei Grip Wrist Ext. Wrist Flex.  R 5 5 5 5 5 5 5   L 5 5 5 5 5 5 5    Side Iliopsoas Quads Hamstring PF DF EHL  R 5 5 5 5 5 5   L 5 5 5 5 5 5    Reflexes are ***2+ and symmetric at the biceps, triceps, brachioradialis, patella and achilles.   Hoffman's is absent.  Clonus is not present.   Bilateral upper and lower extremity sensation is intact to light touch.     Gait is normal.   ***No difficulty with tandem gait.    Medical Decision Making  Imaging: Cervical xrays dated 04/18/23:  Diffuse cervical spondylosis and cervical DDD with anterior spurring.   Radiology report is not available for above xrays.    Lumbar xrays dated 12/06/21:  FINDINGS: Lumbar spine numbered the lowest segmented appearing lumbar shaped vertebrae on lateral view as L5.  Diffuse degenerative change with multilevel degenerative endplate osteophyte formation. No acute bony abnormality. No evidence of fracture. 4 mm anterolisthesis L5-S1. Aortoiliac atherosclerotic vascular calcification.   IMPRESSION: 1. Diffuse degenerative change with multilevel degenerative endplate osteophyte formation. 4 mm anterolisthesis L5 on S1. No acute bony abnormality identified.   2.  Aortoiliac atherosclerotic vascular disease.     Electronically Signed   By: Maisie Fus  Register M.D.   On: 12/07/2021 07:11   I have personally reviewed the images and agree with the above interpretation.   Assessment and Plan: Ms. Verzosa is a pleasant 57 y.o. female has ***  Treatment options discussed  with patient and following plan made:   - Order for physical therapy for *** spine ***. Patient to call to schedule appointment. *** - Continue current medications including ***. Reviewed dosing and side effects.  - Prescription for ***. Reviewed dosing and side effects. Take with food.  - Prescription for *** to take prn muscle spasms. Reviewed dosing and side effects. Discussed this can cause drowsiness.  - MRI of *** to further evaluate *** radiculopathy. No improvement time or medications (***).  - Referral to PMR at [facility] to discuss possible *** injections.  - Will schedule phone visit to review MRI results once I get them back.   I spent a total of *** minutes in face-to-face and non-face-to-face activities related to this patient's care today including review of outside records, review of imaging, review of symptoms, physical exam, discussion of differential diagnosis, discussion of treatment options, and documentation.   Thank you for involving me in the care of this patient.   Drake Leach PA-C Dept. of Neurosurgery

## 2023-06-08 ENCOUNTER — Encounter: Payer: Self-pay | Admitting: Orthopedic Surgery

## 2023-06-08 ENCOUNTER — Ambulatory Visit: Payer: 59 | Admitting: Orthopedic Surgery

## 2023-06-08 VITALS — BP 128/82 | Ht 64.0 in | Wt 238.4 lb

## 2023-06-08 DIAGNOSIS — M5136 Other intervertebral disc degeneration, lumbar region: Secondary | ICD-10-CM

## 2023-06-08 DIAGNOSIS — M5030 Other cervical disc degeneration, unspecified cervical region: Secondary | ICD-10-CM

## 2023-06-08 DIAGNOSIS — M4726 Other spondylosis with radiculopathy, lumbar region: Secondary | ICD-10-CM

## 2023-06-08 DIAGNOSIS — M47816 Spondylosis without myelopathy or radiculopathy, lumbar region: Secondary | ICD-10-CM

## 2023-06-08 DIAGNOSIS — M47812 Spondylosis without myelopathy or radiculopathy, cervical region: Secondary | ICD-10-CM

## 2023-06-08 DIAGNOSIS — M5416 Radiculopathy, lumbar region: Secondary | ICD-10-CM

## 2023-06-08 DIAGNOSIS — M4316 Spondylolisthesis, lumbar region: Secondary | ICD-10-CM

## 2023-06-08 NOTE — Patient Instructions (Signed)
It was so nice to see you today. Thank you so much for coming in.    You have some wear and tear in your neck and back (arthritis) and this is likely what is causing your pain.   I want to get an MRI of your neck and back to look into things further. We will get this approved through your insurance and [facility] [place] [facility] call you to schedule the appointment.   After you have the MRIs done, it will take 5-7 days for me to get the results. Once I have them, we will call you to schedule a follow up with me to review the MRIs. Depending on the results, we may consider sending you for injections in your neck/back.   Please do not hesitate to call if you have any questions or concerns. You can also message me in MyChart.   If you have not heard back about the MRI scans in the next week, please call the office so we can help you get them scheduled.   Drake Leach PA-C [phone]

## 2023-06-14 ENCOUNTER — Ambulatory Visit
Admission: RE | Admit: 2023-06-14 | Discharge: 2023-06-14 | Disposition: A | Discharge status: Home / Self Care | Payer: 59 | Source: Ambulatory Visit | Attending: Orthopedic Surgery | Admitting: Orthopedic Surgery

## 2023-06-14 DIAGNOSIS — M5021 Other cervical disc displacement,  high cervical region: Secondary | ICD-10-CM | POA: Diagnosis not present

## 2023-06-14 DIAGNOSIS — M4802 Spinal stenosis, cervical region: Secondary | ICD-10-CM | POA: Diagnosis not present

## 2023-06-14 DIAGNOSIS — M47812 Spondylosis without myelopathy or radiculopathy, cervical region: Secondary | ICD-10-CM

## 2023-06-14 DIAGNOSIS — M5030 Other cervical disc degeneration, unspecified cervical region: Secondary | ICD-10-CM | POA: Insufficient documentation

## 2023-06-14 DIAGNOSIS — M5137 Other intervertebral disc degeneration, lumbosacral region: Secondary | ICD-10-CM | POA: Diagnosis not present

## 2023-06-14 DIAGNOSIS — M4316 Spondylolisthesis, lumbar region: Secondary | ICD-10-CM | POA: Insufficient documentation

## 2023-06-14 DIAGNOSIS — M5136 Other intervertebral disc degeneration, lumbar region: Secondary | ICD-10-CM | POA: Diagnosis not present

## 2023-06-14 DIAGNOSIS — M5416 Radiculopathy, lumbar region: Secondary | ICD-10-CM | POA: Insufficient documentation

## 2023-06-14 DIAGNOSIS — M50221 Other cervical disc displacement at C4-C5 level: Secondary | ICD-10-CM | POA: Diagnosis not present

## 2023-06-14 DIAGNOSIS — M48061 Spinal stenosis, lumbar region without neurogenic claudication: Secondary | ICD-10-CM | POA: Diagnosis not present

## 2023-06-14 DIAGNOSIS — M47816 Spondylosis without myelopathy or radiculopathy, lumbar region: Secondary | ICD-10-CM | POA: Insufficient documentation

## 2023-06-21 ENCOUNTER — Other Ambulatory Visit: Payer: Self-pay | Admitting: Physician Assistant

## 2023-06-22 NOTE — Telephone Encounter (Signed)
Please schedule 6 month F/U appointment. Thank you! 

## 2023-06-26 NOTE — Telephone Encounter (Signed)
LMOV  

## 2023-06-28 DIAGNOSIS — J019 Acute sinusitis, unspecified: Secondary | ICD-10-CM | POA: Diagnosis not present

## 2023-06-30 DIAGNOSIS — J8410 Pulmonary fibrosis, unspecified: Secondary | ICD-10-CM | POA: Diagnosis not present

## 2023-06-30 NOTE — Telephone Encounter (Signed)
LMOV  

## 2023-07-11 NOTE — Progress Notes (Unsigned)
Referring Physician:  No referring provider defined for this encounter.  Primary Physician:  Mick Sell, MD  History of Present Illness: 07/12/2023 Ms. Bianca Michael has a history of obesity, RA, history of PE when she had covid, hyperlipidemia, HTN, pulmonary fibrosis.   Last seen by me on 06/08/23 for chronic neck and back pain. She has known diffuse cervical spondylosis and cervical DDD with anterior spurring. Lumbar xrays from 2021 showed lumbar spondylosis and DDD with slip at L5-S1.   She is here to review MRI scans of her neck and lower back.   No change in her symptoms since her last visit. She continues with constant neck pain without radiation into the arms or hands. She does have a history of carpal tunnel release in both hands- has some numbness/tingling in right > left hand. She notes some dexterity issues with the hands. She has some balance issues a well.   She also has constant LBP that radiates into the posterior right leg to her knee. No left leg pain. No numbness or tingling. She notes weakness in both legs. Pain is worse with standing, bending, and walking.   She is on PLAVIX.   Bowel/Bladder Dysfunction: none  Conservative measures:  Physical therapy: has not participated, saw a chiropractor years ago Multimodal medical therapy including regular antiinflammatories: ibuprofen, flexeril, tramadol , zanaflex Injections:  No epidural steroid injections.   Past Surgery: denies spine surgery  Bianca Michael has symptoms of cervical myelopathy.  The symptoms are causing a significant impact on the patient's life.   Review of Systems:  A 10 point review of systems is negative, except for the pertinent positives and negatives detailed in the HPI.  Past Medical History: Past Medical History:  Diagnosis Date   Anxiety    Chronic kidney disease    COVID-19 12/2020   Patient was hospitalized   Depression    Hyperlipidemia    Hypertension     Inflammatory arthritis    Pneumothorax    Left lung   Pulmonary embolism (HCC)    Thyroid disease     Past Surgical History: Past Surgical History:  Procedure Laterality Date   CARPAL TUNNEL RELEASE     CHOLECYSTECTOMY     TONSILLECTOMY  1970   TUBAL LIGATION      Allergies: Allergies as of 07/12/2023   (No Known Allergies)    Medications: Outpatient Encounter Medications as of 07/12/2023  Medication Sig   albuterol (VENTOLIN HFA) 108 (90 Base) MCG/ACT inhaler Inhale 2 puffs into the lungs every 4 (four) hours as needed for wheezing or shortness of breath.   amLODipine (NORVASC) 5 MG tablet Take 1 tablet (5 mg total) by mouth daily. PLEASE SCHEDULE OFFICE VISIT FOR FURTHER REFILLS. THANK YOU!   Ascorbic Acid (VITAMIN C) 1000 MG tablet Take 1,000 mg by mouth daily.   Cholecalciferol (VITAMIN D3 ULTRA STRENGTH PO) Take 5,000 Int'l Units/day by mouth.   clopidogrel (PLAVIX) 75 MG tablet Take 1 tablet (75 mg total) by mouth daily. PLEASE SCHEDULE OFFICE VISIT FOR FURTHER REFILLS. THANK YOU!   DENTAGEL 1.1 % GEL dental gel Take by mouth.   hydroxychloroquine (PLAQUENIL) 200 MG tablet TAKE 2 TABLETS BY MOUTH EVERY DAY   levothyroxine (SYNTHROID) 100 MCG tablet    liothyronine (CYTOMEL) 5 MCG tablet Take by mouth.   losartan (COZAAR) 100 MG tablet Take 100 mg by mouth daily.   magnesium oxide (MAG-OX) 400 MG tablet Take by mouth.   MILK THISTLE PO  Take 1 capsule by mouth daily.   Multiple Vitamins-Minerals (WOMENS MULTI) CAPS Take 1 capsule by mouth daily.   naltrexone (DEPADE) 50 MG tablet Take 50 mg by mouth daily.   omeprazole (PRILOSEC) 40 MG capsule TAKE 1 CAPSULE (40 MG TOTAL) BY MOUTH DAILY.   Semaglutide-Weight Management 0.5 MG/0.5ML SOAJ Inject 0.5 mg into the skin. Inject 0.5 mLs (0.25 mg total) subcutaneously once a week   tiZANidine (ZANAFLEX) 4 MG tablet TAKE 1 TABLET BY MOUTH EVERYDAY AT BEDTIME   zinc gluconate 50 MG tablet Take 50 mg by mouth daily.   buPROPion HCl  ER, XL, 450 MG TB24 Take by mouth.   No facility-administered encounter medications on file as of 07/12/2023.    Social History: Social History   Tobacco Use   Smoking status: Former    Current packs/day: 0.00    Average packs/day: 1 pack/day for 10.0 years (10.0 ttl pk-yrs)    Types: Cigarettes    Start date: 12/26/1990    Quit date: 12/26/2000    Years since quitting: 22.5    Passive exposure: Current   Smokeless tobacco: Never  Vaping Use   Vaping status: Never Used  Substance Use Topics   Alcohol use: Yes    Comment: very rarely   Drug use: Not Currently    Family Medical History: Family History  Problem Relation Age of Onset   Breast cancer Mother 21   Colon cancer Mother    Heart attack Father    Heart disease Father    High Cholesterol Father    Thyroid disease Sister    Breast cancer Sister    Thyroid disease Sister    Thyroid disease Sister    Lupus Sister    Rheum arthritis Sister    Thyroid disease Daughter     Physical Examination: Vitals:   07/12/23 1142  BP: (!) 126/91  Pulse: 77      Awake, alert, oriented to person, place, and time.  Speech is clear and fluent. Fund of knowledge is appropriate.   Cranial Nerves: Pupils equal round and reactive to light.  Facial tone is symmetric.    No abnormal lesions on exposed skin.   Strength: Side Biceps Triceps Deltoid Interossei Grip Wrist Ext. Wrist Flex.  R 5 5 5 5 5 5 5   L 5 5 5 5 5 5 5    Side Iliopsoas Quads Hamstring PF DF EHL  R 5 5 5 5 5 5   L 5 5 5 5 5 5    Reflexes are 2+ and symmetric at the biceps, triceps, brachioradialis, patella and achilles.   Hoffman's is absent.  Clonus is not present.   Bilateral upper and lower extremity sensation is intact to light touch.     Gait is normal.     Medical Decision Making  Imaging: Cervical MRI dated 06/14/23:   FINDINGS: Alignment: Straightening of the normal cervical lordosis. 2 mm anterolisthesis of C2 on C3, with 2 mm retrolisthesis of  C3 on C4. Additional trace retrolisthesis of C4 on C5.   Vertebrae: Vertebral body height maintained without acute or chronic fracture. Bone marrow signal intensity within normal limits. No worrisome osseous lesions. Reactive marrow edema present about the skull base, primarily about the atlantooccipital articulations. Marrow edema noted about the C2 synchondrosis as well. No other abnormal marrow edema.   Cord: Suspected subtle signal abnormality involving the cervical cord at the level of C3-4 (series 5, image 16). No other visible or convincing cord signal abnormality.  Posterior Fossa, vertebral arteries, paraspinal tissues: Unremarkable.   Disc levels:   C2-C3: Trace anterolisthesis. Mild disc bulge with uncovertebral spurring. Severe left-sided facet arthrosis. No significant spinal stenosis. Moderate to severe left C3 foraminal narrowing. Right neural foramina remains patent.   C3-C4: Trace retrolisthesis. Diffuse disc bulge with endplate and uncovertebral spurring. Posterior disc osteophyte complex flattens and indents the ventral thecal sac. Secondary cord flattening with suspected subtle cord signal changes. Severe spinal stenosis. Severe right worse than left C4 foraminal narrowing.   C4-C5: Broad base central disc protrusion indents the ventral thecal sac, contacting and flattening the ventral cord (series 5, image 21). No visible cord signal changes. Severe spinal stenosis. Superimposed endplate and uncovertebral spurring with resultant severe bilateral C5 foraminal stenosis.   C5-C6: Lobulated broad-based posterior disc protrusion flattens and effaces the ventral thecal sac, slightly asymmetric to the left (series 5, image 25). Secondary cord flattening without cord signal changes. Severe spinal stenosis. Superimposed uncovertebral spurring with severe bilateral C6 foraminal narrowing.   C6-C7: Diffuse disc bulge with bilateral uncovertebral  spurring. Flattening and partial effacement of the ventral thecal sac. Mild cord flattening without cord signal changes. Moderate spinal stenosis. Severe left with moderate right C7 foraminal narrowing.   C7-T1: Minimal disc bulge. Mild facet hypertrophy. No canal or foraminal stenosis.   IMPRESSION: 1. Multilevel cervical spondylosis with resultant severe diffuse spinal stenosis at C3-4 through C6-7. Secondary multifocal cord flattening at these levels. Suspected subtle associated cord signal changes at the level of C3-4, suspicious for compressive myelomalacia. 2. Multifactorial degenerative changes with resultant multilevel foraminal narrowing as above. Notable findings include moderate to severe left C3 foraminal stenosis, severe bilateral C4 through C6 foraminal narrowing, with severe left and moderate right C7 foraminal stenosis. 3. Osteoarthritic changes about the skull base with associated reactive marrow edema. Finding could contribute to neck pain.     Electronically Signed   By: Rise Mu M.D.   On: 06/22/2023 04:37   Lumbar MRI dated 06/14/23:  FINDINGS: Segmentation:  Standard.   Alignment: Grade 1 anterolisthesis of L5 on S1 secondary to facet disease.   Vertebrae: No acute fracture, evidence of discitis, or aggressive bone lesion.   Conus medullaris and cauda equina: Conus extends to the L1 level. Conus and cauda equina appear normal.   Paraspinal and other soft tissues: No acute paraspinal abnormality.   Disc levels:   Disc spaces: Mild disc desiccation at L3-4 and L4-5. Disc height loss at T10-11 and T11-12.   T12-L1: Minimal broad-based disc bulge. No foraminal or central canal stenosis. Mild bilateral facet arthropathy.   L1-L2: No significant disc bulge. Mild bilateral facet arthropathy. No foraminal or central canal stenosis.   L2-L3: No significant disc bulge. Mild bilateral facet arthropathy. No foraminal or central canal  stenosis.   L3-L4: Mild broad-based disc bulge. Moderate bilateral facet arthropathy. Mild bilateral foraminal stenosis. No spinal stenosis.   L4-L5: Broad-based disc bulge. Moderate bilateral facet arthropathy. Bilateral lateral recess stenosis. Mild spinal stenosis. Severe bilateral foraminal stenosis.   L5-S1: Mild broad-based disc bulge. Severe bilateral facet arthropathy. Moderate-severe bilateral foraminal stenosis. No spinal stenosis.   IMPRESSION: 1. At L4-5 there is a broad-based disc bulge. Moderate bilateral facet arthropathy. Bilateral lateral recess stenosis. Mild spinal stenosis. Severe bilateral foraminal stenosis. 2. At L5-S1 there is a mild broad-based disc bulge. Severe bilateral facet arthropathy. Moderate-severe bilateral foraminal stenosis. 3. At L3-4 there is a mild broad-based disc bulge. Moderate bilateral facet arthropathy. Mild bilateral foraminal stenosis. 4. No  acute osseous injury of the lumbar spine.     Electronically Signed   By: Elige Ko M.D.   On: 06/22/2023 06:40   I have personally reviewed the images and agree with the above interpretation.   Assessment and Plan: Ms. Yusupov is a pleasant 57 y.o. female has chronic neck and LBP x 10+ years.   She continues with constant neck pain without radiation into the arms or hands. She does have a history of carpal tunnel release in both hands- has some numbness/tingling in right > left hand. She notes some dexterity issues with the hands. She has some balance issues a well.   She has known cervical spondylosis with severe central stenosis C3-C7, she has suspected cord signal change at C3-C4. Also with multilevel foraminal stenosis as well.   She also has constant LBP that radiates into the posterior right leg to her knee. No left leg pain. She notes weakness in both legs.  She has known lumbar spondylosis with mild central and severe bilateral foraminal stenosis L4-L5. Also with slip L5-S1 with  moderate/severe bilateral foraminal stenosis.   Treatment options discussed with patient and following plan made:   - She has multilevel severe central stenosis with question of cord signal change. No signs of myelopathy on exam, but she does note some dexterity and balance issues. Cervical stenosis discussed at length.  - Recommend she follow up with Dr. Myer Haff to discuss possible surgery options for cervical spine.  - Treatment of cervical spine takes precedence for now over her lumbar spine, but can revisit it. Would consider lumbar PT and/or injections.   I spent a total of 25 minutes in face-to-face and non-face-to-face activities related to this patient's care today including review of outside records, review of imaging, review of symptoms, physical exam, discussion of differential diagnosis, discussion of treatment options, and documentation.   Drake Leach PA-C Dept. of Neurosurgery

## 2023-07-12 ENCOUNTER — Ambulatory Visit (INDEPENDENT_AMBULATORY_CARE_PROVIDER_SITE_OTHER): Payer: 59 | Admitting: Orthopedic Surgery

## 2023-07-12 ENCOUNTER — Encounter: Payer: Self-pay | Admitting: Orthopedic Surgery

## 2023-07-12 VITALS — BP 126/91 | HR 77 | Wt 230.0 lb

## 2023-07-12 DIAGNOSIS — M4802 Spinal stenosis, cervical region: Secondary | ICD-10-CM

## 2023-07-12 DIAGNOSIS — M4726 Other spondylosis with radiculopathy, lumbar region: Secondary | ICD-10-CM

## 2023-07-12 DIAGNOSIS — M4316 Spondylolisthesis, lumbar region: Secondary | ICD-10-CM

## 2023-07-12 DIAGNOSIS — M5030 Other cervical disc degeneration, unspecified cervical region: Secondary | ICD-10-CM

## 2023-07-12 DIAGNOSIS — M5136 Other intervertebral disc degeneration, lumbar region: Secondary | ICD-10-CM

## 2023-07-12 DIAGNOSIS — M47812 Spondylosis without myelopathy or radiculopathy, cervical region: Secondary | ICD-10-CM

## 2023-07-12 DIAGNOSIS — M5416 Radiculopathy, lumbar region: Secondary | ICD-10-CM

## 2023-07-12 DIAGNOSIS — M47816 Spondylosis without myelopathy or radiculopathy, lumbar region: Secondary | ICD-10-CM

## 2023-07-13 NOTE — Progress Notes (Signed)
Referring Physician:  Mick Sell, MD [address] [place],  [place] [address]  Primary Physician:  Mick Sell, MD  History of Present Illness: 07/18/2023 Ms. Bianca Michael is here today with a chief complaint of neck and back pain.  She has constant neck pain without clear shooting pains into her arms.  She has history of carpal tunnel release in both hands and has some persistent numbness.  This has been getting slightly worse.  She has some dexterity issues, particularly handling small items such as the backs of her earrings.  She has had some problems with balance, but no falls.  She she had a very serious bout with COVID in 2021.  She required tracheostomy placement, but has been decannulated.  She is using a heating pad to help with her pain.  Bowel/Bladder Dysfunction: none  Conservative measures:  Physical therapy:  has not participated, saw a chiropractor a few years ago  Multimodal medical therapy including regular antiinflammatories:  ibuprofen, flexeril, tramadol , zanaflex  Injections:  No epidural steroid injections  Past Surgery:  denies  Bianca Michael has mild symptoms of cervical myelopathy.  Saw Drake Leach PA 07/12/23 07/12/2023 Ms. Bianca Michael has a history of obesity, RA, history of PE when she had covid, hyperlipidemia, HTN, pulmonary fibrosis.    Last seen by me on 06/08/23 for chronic neck and back pain. She has known diffuse cervical spondylosis and cervical DDD with anterior spurring. Lumbar xrays from 2021 showed lumbar spondylosis and DDD with slip at L5-S1.    She is here to review MRI scans of her neck and lower back.    No change in her symptoms since her last visit. She continues with constant neck pain without radiation into the arms or hands. She does have a history of carpal tunnel release in both hands- has some numbness/tingling in right > left hand. She notes some dexterity issues with the hands. She has some  balance issues a well.    She also has constant LBP that radiates into the posterior right leg to her knee. No left leg pain. No numbness or tingling. She notes weakness in both legs. Pain is worse with standing, bending, and walking.    She is on PLAVIX.  The symptoms are causing a significant impact on the patient's life.   I have utilized the care everywhere function in epic to review the outside records available from external health systems.  Review of Systems:  A 10 point review of systems is negative, except for the pertinent positives and negatives detailed in the HPI.  Past Medical History: Past Medical History:  Diagnosis Date   Anxiety    Chronic kidney disease    COVID-19 12/2020   Patient was hospitalized   Depression    Hyperlipidemia    Hypertension    Inflammatory arthritis    Pneumothorax    Left lung   Pulmonary embolism (HCC)    Thyroid disease     Past Surgical History: Past Surgical History:  Procedure Laterality Date   CARPAL TUNNEL RELEASE     CHOLECYSTECTOMY     TONSILLECTOMY  1970   TUBAL LIGATION      Allergies: Allergies as of 07/18/2023   (No Known Allergies)    Medications:  Current Outpatient Medications:    albuterol (VENTOLIN HFA) 108 (90 Base) MCG/ACT inhaler, Inhale 2 puffs into the lungs every 4 (four) hours as needed for wheezing or shortness of breath., Disp: 18  g, Rfl: 2   amLODipine (NORVASC) 5 MG tablet, Take 1 tablet (5 mg total) by mouth daily. PLEASE SCHEDULE OFFICE VISIT FOR FURTHER REFILLS. THANK YOU!, Disp: 30 tablet, Rfl: 0   Ascorbic Acid (VITAMIN C) 1000 MG tablet, Take 1,000 mg by mouth daily., Disp: , Rfl:    Cholecalciferol (VITAMIN D3 ULTRA STRENGTH PO), Take 5,000 Int'l Units/day by mouth., Disp: , Rfl:    clopidogrel (PLAVIX) 75 MG tablet, TAKE 1 TABLET BY MOUTH DAILY. PLEASE SCHEDULE OFFICE VISIT FOR FURTHER REFILLS. THANK YOU!, Disp: 30 tablet, Rfl: 0   DENTAGEL 1.1 % GEL dental gel, Take by mouth., Disp: ,  Rfl:    hydroxychloroquine (PLAQUENIL) 200 MG tablet, TAKE 2 TABLETS BY MOUTH EVERY DAY, Disp: 180 tablet, Rfl: 1   levothyroxine (SYNTHROID) 100 MCG tablet, , Disp: , Rfl:    liothyronine (CYTOMEL) 5 MCG tablet, Take by mouth., Disp: , Rfl:    losartan (COZAAR) 100 MG tablet, Take 100 mg by mouth daily., Disp: , Rfl:    magnesium oxide (MAG-OX) 400 MG tablet, Take by mouth., Disp: , Rfl:    MILK THISTLE PO, Take 1 capsule by mouth daily., Disp: , Rfl:    Multiple Vitamins-Minerals (WOMENS MULTI) CAPS, Take 1 capsule by mouth daily., Disp: , Rfl:    naltrexone (DEPADE) 50 MG tablet, Take 50 mg by mouth daily., Disp: , Rfl:    omeprazole (PRILOSEC) 40 MG capsule, TAKE 1 CAPSULE (40 MG TOTAL) BY MOUTH DAILY., Disp: 90 capsule, Rfl: 1   Semaglutide-Weight Management 0.5 MG/0.5ML SOAJ, Inject 0.5 mg into the skin. Inject 0.5 mLs (0.25 mg total) subcutaneously once a week, Disp: , Rfl:    tiZANidine (ZANAFLEX) 4 MG tablet, TAKE 1 TABLET BY MOUTH EVERYDAY AT BEDTIME, Disp: 90 tablet, Rfl: 0   zinc gluconate 50 MG tablet, Take 50 mg by mouth daily., Disp: , Rfl:    buPROPion HCl ER, XL, 450 MG TB24, Take by mouth., Disp: , Rfl:   Social History: Social History   Tobacco Use   Smoking status: Former    Current packs/day: 0.00    Average packs/day: 1 pack/day for 10.0 years (10.0 ttl pk-yrs)    Types: Cigarettes    Start date: 12/26/1990    Quit date: 12/26/2000    Years since quitting: 22.5    Passive exposure: Current   Smokeless tobacco: Never  Vaping Use   Vaping status: Never Used  Substance Use Topics   Alcohol use: Yes    Comment: very rarely   Drug use: Not Currently    Family Medical History: Family History  Problem Relation Age of Onset   Breast cancer Mother 25   Colon cancer Mother    Heart attack Father    Heart disease Father    High Cholesterol Father    Thyroid disease Sister    Breast cancer Sister    Thyroid disease Sister    Thyroid disease Sister    Lupus Sister     Rheum arthritis Sister    Thyroid disease Daughter     Physical Examination: Vitals:   07/18/23 0959  BP: 110/74    General: Patient is in no apparent distress. Attention to examination is appropriate.  Neck:   Supple.  Full range of motion.  Respiratory: Patient is breathing without any difficulty.   NEUROLOGICAL:     Awake, alert, oriented to person, place, and time.  Speech is clear and fluent.   Cranial Nerves: Pupils equal round and reactive  to light.  Facial tone is symmetric.  Facial sensation is symmetric. Shoulder shrug is symmetric. Tongue protrusion is midline.  There is no pronator drift.  Strength: Side Biceps Triceps Deltoid Interossei Grip Wrist Ext. Wrist Flex.  R 5 5 5  4+ 4+ 5 5  L 5 5 5  4+ 4+ 5 5   Side Iliopsoas Quads Hamstring PF DF EHL  R 5 5 5 5 5 5   L 5 5 5 5 5 5    Reflexes are 1+ and symmetric at the biceps, triceps, brachioradialis, patella and achilles.   Hoffman's is absent.   Bilateral upper and lower extremity sensation is intact to light touch.    No evidence of dysmetria noted.  Gait is wide-based.     Medical Decision Making  Imaging: MR C spine 06/14/2023   IMPRESSION: 1. Multilevel cervical spondylosis with resultant severe diffuse spinal stenosis at C3-4 through C6-7. Secondary multifocal cord flattening at these levels. Suspected subtle associated cord signal changes at the level of C3-4, suspicious for compressive myelomalacia. 2. Multifactorial degenerative changes with resultant multilevel foraminal narrowing as above. Notable findings include moderate to severe left C3 foraminal stenosis, severe bilateral C4 through C6 foraminal narrowing, with severe left and moderate right C7 foraminal stenosis. 3. Osteoarthritic changes about the skull base with associated reactive marrow edema. Finding could contribute to neck pain.     Electronically Signed   By: Rise Mu M.D.   On: 06/22/2023 04:37  MR L spine  06/14/2023  IMPRESSION: 1. At L4-5 there is a broad-based disc bulge. Moderate bilateral facet arthropathy. Bilateral lateral recess stenosis. Mild spinal stenosis. Severe bilateral foraminal stenosis. 2. At L5-S1 there is a mild broad-based disc bulge. Severe bilateral facet arthropathy. Moderate-severe bilateral foraminal stenosis. 3. At L3-4 there is a mild broad-based disc bulge. Moderate bilateral facet arthropathy. Mild bilateral foraminal stenosis. 4. No acute osseous injury of the lumbar spine.     Electronically Signed   By: Elige Ko M.D.   On: 06/22/2023 06:40  I have personally reviewed the images and agree with the above interpretation.  Assessment and Plan: Ms. Faries is a pleasant 57 y.o. female with clinical symptoms of cervical myelopathy.  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  We reviewed that there is no role for conservative management for cervical myelopathy.  Her symptoms are currently not too severe, so we will watch for now.  I will see her back in 3 months in clinic.  If her symptoms worsen at any point, we will consider cervical laminoplasty C3-7  I will give her some exercises to try to help with her neck pain.  We reviewed warning signs that would warrant an earlier return to care.    Thank you for involving me in the care of this patient.      Ayinde Swim K. Myer Haff MD, [facility] Neurosurgery

## 2023-07-14 ENCOUNTER — Ambulatory Visit: Payer: 59 | Admitting: Primary Care

## 2023-07-16 ENCOUNTER — Other Ambulatory Visit: Payer: Self-pay | Admitting: Physician Assistant

## 2023-07-18 ENCOUNTER — Ambulatory Visit (INDEPENDENT_AMBULATORY_CARE_PROVIDER_SITE_OTHER): Payer: 59 | Admitting: Neurosurgery

## 2023-07-18 ENCOUNTER — Encounter: Payer: Self-pay | Admitting: Pulmonary Disease

## 2023-07-18 ENCOUNTER — Encounter: Payer: Self-pay | Admitting: Neurosurgery

## 2023-07-18 VITALS — BP 110/74 | Ht 64.5 in | Wt 230.9 lb

## 2023-07-18 DIAGNOSIS — G959 Disease of spinal cord, unspecified: Secondary | ICD-10-CM

## 2023-07-18 DIAGNOSIS — M4802 Spinal stenosis, cervical region: Secondary | ICD-10-CM | POA: Diagnosis not present

## 2023-07-18 DIAGNOSIS — E669 Obesity, unspecified: Secondary | ICD-10-CM

## 2023-07-19 NOTE — Telephone Encounter (Signed)
Dr. Jayme Cloud, ok to place order to d/c oxygen concentrator?

## 2023-07-19 NOTE — Telephone Encounter (Signed)
Okay to DC O2 AGAINST MEDICAL ADVICE.

## 2023-08-02 DIAGNOSIS — Z6841 Body Mass Index (BMI) 40.0 and over, adult: Secondary | ICD-10-CM | POA: Diagnosis not present

## 2023-08-02 DIAGNOSIS — E039 Hypothyroidism, unspecified: Secondary | ICD-10-CM | POA: Diagnosis not present

## 2023-08-09 DIAGNOSIS — I10 Essential (primary) hypertension: Secondary | ICD-10-CM | POA: Diagnosis not present

## 2023-08-09 DIAGNOSIS — E079 Disorder of thyroid, unspecified: Secondary | ICD-10-CM | POA: Diagnosis not present

## 2023-08-09 DIAGNOSIS — N1832 Chronic kidney disease, stage 3b: Secondary | ICD-10-CM | POA: Diagnosis not present

## 2023-08-09 DIAGNOSIS — Z6841 Body Mass Index (BMI) 40.0 and over, adult: Secondary | ICD-10-CM | POA: Diagnosis not present

## 2023-08-09 DIAGNOSIS — F32A Depression, unspecified: Secondary | ICD-10-CM | POA: Diagnosis not present

## 2023-08-09 DIAGNOSIS — E785 Hyperlipidemia, unspecified: Secondary | ICD-10-CM | POA: Diagnosis not present

## 2023-08-18 ENCOUNTER — Other Ambulatory Visit: Payer: Self-pay | Admitting: Physician Assistant

## 2023-08-23 ENCOUNTER — Telehealth: Payer: Self-pay | Admitting: Cardiology

## 2023-08-23 NOTE — Telephone Encounter (Signed)
Left message on voice mail to schedule

## 2023-08-23 NOTE — Telephone Encounter (Signed)
-----   Message from [facility] sent at 08/18/2023  1:14 PM EDT ----- Regarding: Appointment recall Renee Rival,  Will you please reach out to patient to schedule overdue 6 month follow up. Medication refill being sent over.  Thanks,  [employer]

## 2023-09-08 ENCOUNTER — Encounter: Payer: Self-pay | Admitting: Nurse Practitioner

## 2023-09-08 ENCOUNTER — Ambulatory Visit (INDEPENDENT_AMBULATORY_CARE_PROVIDER_SITE_OTHER): Payer: 59 | Admitting: Nurse Practitioner

## 2023-09-08 VITALS — BP 120/84 | HR 81 | Temp 98.2°F | Ht 64.5 in | Wt 221.7 lb

## 2023-09-08 DIAGNOSIS — J386 Stenosis of larynx: Secondary | ICD-10-CM | POA: Diagnosis not present

## 2023-09-08 DIAGNOSIS — R490 Dysphonia: Secondary | ICD-10-CM | POA: Diagnosis not present

## 2023-09-08 DIAGNOSIS — J302 Other seasonal allergic rhinitis: Secondary | ICD-10-CM

## 2023-09-08 DIAGNOSIS — J8410 Pulmonary fibrosis, unspecified: Secondary | ICD-10-CM

## 2023-09-08 DIAGNOSIS — G4734 Idiopathic sleep related nonobstructive alveolar hypoventilation: Secondary | ICD-10-CM | POA: Insufficient documentation

## 2023-09-08 DIAGNOSIS — J309 Allergic rhinitis, unspecified: Secondary | ICD-10-CM | POA: Insufficient documentation

## 2023-09-08 DIAGNOSIS — J3800 Paralysis of vocal cords and larynx, unspecified: Secondary | ICD-10-CM

## 2023-09-08 MED ORDER — FLUTICASONE PROPIONATE 50 MCG/ACT NA SUSP: 2.0000 | Freq: Every day | NASAL | 2 refills | Status: AC

## 2023-09-08 MED ORDER — STIOLTO RESPIMAT 2.5-2.5 MCG/ACT IN AERS: 2.0000 | INHALATION_SPRAY | Freq: Every day | RESPIRATORY_TRACT | 0 refills | Status: DC

## 2023-09-08 MED ORDER — STIOLTO RESPIMAT 2.5-2.5 MCG/ACT IN AERS: 2.0000 | INHALATION_SPRAY | Freq: Every day | RESPIRATORY_TRACT | 5 refills | Status: DC

## 2023-09-08 NOTE — Assessment & Plan Note (Signed)
See above

## 2023-09-08 NOTE — Progress Notes (Signed)
Agree with the details of the visit as noted by Micheline Maze, NP.  Gailen Shelter, MD Advanced Bronchoscopy PCCM [facility]

## 2023-09-08 NOTE — Assessment & Plan Note (Signed)
No change. Never followed up with speech. Follow up with ENT as scheduled.

## 2023-09-08 NOTE — Assessment & Plan Note (Signed)
Nocturnal hypoxia. She had repeat ONO and was advised to continue 2 lpm supplemental O2 at night. She has since discontinued this AMA. She is trying to get oxygen from a different company still. Will let us know if she needs anything. Understands importance. Goal >88-90%

## 2023-09-08 NOTE — Patient Instructions (Addendum)
Continue Albuterol inhaler 2 puffs every 6 hours as needed for shortness of breath or wheezing. Notify if symptoms persist despite rescue inhaler/neb use. Continue omeprazole daily  Flonase nasal spray 2 sprays each nostril daily for nasal congestion/drainage Start a daily non drowsy allergy pill such as claritin, zyrtec, xyzal or allegra. Store brand version is fine Holiday representative 2 puffs daily  Schedule PFTs once able  Follow up in 6 weeks to see how inhaler is working with Dr. Jayme Cloud or Florentina Addison Jaquay Posthumus,NP. If symptoms do not improve or worsen, please contact office for sooner follow up or seek emergency care.

## 2023-09-08 NOTE — Progress Notes (Signed)
@Patient  ID: Bianca Michael, female    DOB: [birth year], 57 y.o.   MRN: [id number]  Chief Complaint  Patient presents with   Follow-up    SOB with exertion and while talking. No wheezing or cough.     Referring provider: Mick Sell, MD  HPI: 57 year old female, former smoker followed for postinflammatory fibrosis, vocal cord paresis, nocturnal hypoxia, exercise induced laryngeal obstruction. She is a patient of Dr. Jayme Cloud and last seen in office 05/11/2023. Past medical history significant for hx of PE, HTN, chronic sinusitis, thyroid disease, inflammatory arthritis, anxiety, HLD, depression, obesity.   TEST/EVENTS:  09/07/2021 PFT: FVC 75, FEV1 86, ratio 89, TLC 77, DLCO 85. No BD 09/21/2021 HRCT chest: atherosclerosis. Several densely calcified b/l hilar nodes. Multiple nodular areas of architectural distortion and btx, favored to represent post inflammatory scarring. Patchy ground glass attenuation, septal thickening. Air trapping. Could be indicative of early or mild ILD. Not UIP.  11/27/2021 HST: AHI 9/h, SpO2 low 84%  05/11/2023: OV with Dr. Jayme Cloud. Prolonged hospitalization for COVID in 2022 with intubation and subsequent tracheostomy. After removal of her trach, she has had persistent hoarseness. Previous studies with mild postinflammatory fibrosis. PFTs mostly restrictive. Using Stiolto which initially helped with cough and SOB but now ineffective. Eval with ENT with laryngoscopy exam - prolapse of left vocal cord. Referred to speech however did not follow through due to time constraints. SOB on inspiration with exertion. Question exercise induced laryngeal obstruction. Previously diagnosed with mild OSA; however, can not tolerate CPAP. Has been on nocturnal oxygen 3 lpm. Working on weight loss with PCP. Will d/c Stiolto due to ineffectiveness. Recommend pulmonary rehab but unable to afford. Reassess with PFTs. Clinically no progression.   09/08/2023: Today - follow  up Patient presents today for follow up. Her breathing feels about the same as her last visit but is having to use her albuterol a little bit more frequently since she stopped Stiolto. Looking back, she does think this may have helped her some. She does not have any significant cough or chest congestion. Not noticing much wheezing. Voice hoarseness is unchanged. She is having a little more nasal congestion and drainage with the recent weather change. Mucus is clear. No sinus tenderness, headaches, or sick exposures. She was not able to do the PFTs previously ordered due to affordability. Using albuterol 2-3 times a day, most days.    No Known Allergies  Immunization History  Administered Date(s) Administered   Hep A / Hep B 12/14/2021, 01/31/2022    Past Medical History:  Diagnosis Date   Anxiety    Chronic kidney disease    COVID-19 12/2020   Patient was hospitalized   Depression    Hyperlipidemia    Hypertension    Inflammatory arthritis    Pneumothorax    Left lung   Pulmonary embolism (HCC)    Thyroid disease     Tobacco History: Social History   Tobacco Use  Smoking Status Former   Current packs/day: 0.00   Average packs/day: 1 pack/day for 10.0 years (10.0 ttl pk-yrs)   Types: Cigarettes   Start date: 12/26/1990   Quit date: 12/26/2000   Years since quitting: 22.7   Passive exposure: Current  Smokeless Tobacco Never   Counseling given: Not Answered   Outpatient Medications Prior to Visit  Medication Sig Dispense Refill   albuterol (VENTOLIN HFA) 108 (90 Base) MCG/ACT inhaler Inhale 2 puffs into the lungs every 4 (four) hours as needed for wheezing  or shortness of breath. 18 g 2   amLODipine (NORVASC) 5 MG tablet Take 1 tablet (5 mg total) by mouth daily. PLEASE SCHEDULE OFFICE VISIT FOR FURTHER REFILLS. THANK YOU! 30 tablet 0   Ascorbic Acid (VITAMIN C) 1000 MG tablet Take 1,000 mg by mouth daily.     Cholecalciferol (VITAMIN D3 ULTRA STRENGTH PO) Take 5,000 Int'l  Units/day by mouth.     clopidogrel (PLAVIX) 75 MG tablet Take 1 tablet (75 mg total) by mouth daily. PATIENT SHOULD CONTACT OFFICE TO SCHEDULE APPOINTMENT PRIOR TO ADDITIONAL REFILL REQUESTS. [id number] 30 tablet 0   DENTAGEL 1.1 % GEL dental gel Take by mouth.     hydroxychloroquine (PLAQUENIL) 200 MG tablet TAKE 2 TABLETS BY MOUTH EVERY DAY 180 tablet 1   levothyroxine (SYNTHROID) 100 MCG tablet      liothyronine (CYTOMEL) 5 MCG tablet Take by mouth.     losartan (COZAAR) 100 MG tablet Take 100 mg by mouth daily.     magnesium oxide (MAG-OX) 400 MG tablet Take by mouth.     MILK THISTLE PO Take 1 capsule by mouth daily.     Multiple Vitamins-Minerals (WOMENS MULTI) CAPS Take 1 capsule by mouth daily.     naltrexone (DEPADE) 50 MG tablet Take 50 mg by mouth daily.     omeprazole (PRILOSEC) 40 MG capsule TAKE 1 CAPSULE (40 MG TOTAL) BY MOUTH DAILY. 90 capsule 1   Semaglutide-Weight Management 0.5 MG/0.5ML SOAJ Inject 0.5 mg into the skin. Inject 0.5 mLs (0.25 mg total) subcutaneously once a week     tiZANidine (ZANAFLEX) 4 MG tablet TAKE 1 TABLET BY MOUTH EVERYDAY AT BEDTIME (Patient taking differently: as needed.) 90 tablet 0   zinc gluconate 50 MG tablet Take 50 mg by mouth daily.     buPROPion HCl ER, XL, 450 MG TB24 Take by mouth.     No facility-administered medications prior to visit.     Review of Systems:   Constitutional: No weight loss or gain, night sweats, fevers, chills, or lassitude. +fatigue (baseline) HEENT: No headaches, difficulty swallowing, tooth/dental problems, or sore throat. No sneezing, itching, ear ache. +nasal congestion, post nasal drip, hoarse voice  CV:  No chest pain, orthopnea, PND, swelling in lower extremities, anasarca, dizziness, palpitations, syncope Resp: +shortness of breath with exertion. No excess mucus or change in color of mucus. No productive or non-productive. No hemoptysis. No wheezing.  No chest wall deformity GI:  No heartburn,  indigestion GU: No dysuria, change in color of urine, urgency or frequency.   Skin: No rash, lesions, ulcerations MSK:  No joint pain or swelling.   Neuro: No dizziness or lightheadedness.  Psych: No depression or anxiety. Mood stable.     Physical Exam:  BP 120/84 (BP Location: Right Arm, Cuff Size: Large)   Pulse 81   Temp 98.2 F (36.8 C)   Ht 5' 4.5" (1.638 m)   Wt 221 lb 11.2 oz (100.6 kg)   SpO2 96%   BMI 37.47 kg/m   GEN: Pleasant, interactive, well-appearing; obese; in no acute distress. HEENT:  Normocephalic and atraumatic. PERRLA. Sclera white. Nasal turbinates boggy, moist and patent bilaterally. No rhinorrhea present. Oropharynx pink and moist, without exudate or edema. No lesions, ulcerations, or postnasal drip. Hoarse voice quality NECK:  Supple w/ fair ROM. No JVD present. Normal carotid impulses w/o bruits. Thyroid symmetrical with no goiter or nodules palpated. No lymphadenopathy. Old tracheostomy site; well healed CV: RRR, no m/r/g, no peripheral edema. Pulses intact, +2  bilaterally. No cyanosis, pallor or clubbing. PULMONARY:  Unlabored, regular breathing. Clear bilaterally A&P w/o wheezes/rales/rhonchi. No accessory muscle use.  GI: BS present and normoactive. Soft, non-tender to palpation. No organomegaly or masses detected.  MSK: No erythema, warmth or tenderness. Cap refil <2 sec all extrem. No deformities or joint swelling noted.  Neuro: A/Ox3. No focal deficits noted.   Skin: Warm, no lesions or rashe Psych: Normal affect and behavior. Judgement and thought content appropriate.     Lab Results:  CBC    Component Value Date/Time   WBC 4.5 10/17/2022 1208   RBC 5.32 (H) 10/17/2022 1208   HGB 15.1 10/17/2022 1208   HGB 14.8 02/23/2022 0913   HCT 44.8 10/17/2022 1208   HCT 44.7 02/23/2022 0913   PLT 203 10/17/2022 1208   PLT 193 02/23/2022 0913   MCV 84.2 10/17/2022 1208   MCV 84 02/23/2022 0913   MCH 28.4 10/17/2022 1208   MCHC 33.7 10/17/2022  1208   RDW 13.1 10/17/2022 1208   RDW 13.9 02/23/2022 0913   LYMPHSABS 1,859 10/17/2022 1208   LYMPHSABS 2.4 02/23/2022 0913   MONOABS 0.4 02/17/2022 1026   EOSABS 158 10/17/2022 1208   EOSABS 0.3 02/23/2022 0913   BASOSABS 59 10/17/2022 1208   BASOSABS 0.1 02/23/2022 0913    BMET    Component Value Date/Time   NA 141 10/17/2022 1208   NA 141 02/23/2022 0913   K 4.5 10/17/2022 1208   CL 104 10/17/2022 1208   CO2 28 10/17/2022 1208   GLUCOSE 85 10/17/2022 1208   BUN 12 10/17/2022 1208   BUN 12 02/23/2022 0913   CREATININE 1.24 (H) 10/17/2022 1208   CALCIUM 10.0 10/17/2022 1208   GFRNONAA 48 (L) 02/17/2022 1026    BNP No results found for: "BNP"   Imaging:  No results found.  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 She has not had follow up imaging or PFTs. She is currently unable to afford these. She will notify us when she is able. Clinically stable. She does have increased use of SABA since discontinuing maintenance with LABA/LAMA. Will resume Stiolto and reassess response at follow up. Medication education provided. Teachback performed. Encouraged to work on graded exercises.  Patient Instructions  Continue Albuterol inhaler 2 puffs every 6 hours as needed for shortness of breath or wheezing. Notify if symptoms persist despite rescue inhaler/neb use. Continue omeprazole daily  Flonase nasal spray 2 sprays each nostril daily for nasal congestion/drainage Start a daily non drowsy allergy pill such as claritin, zyrtec, xyzal or allegra. Store brand version is fine Holiday representative 2 puffs daily  Schedule PFTs once able  Follow up in 6 weeks to see how inhaler is working with Dr. Jayme Cloud or Florentina Addison Aubriegh Minch,NP. If symptoms do not improve or worsen, please contact office  for sooner follow up or seek emergency care.    Allergic rhinitis Slight increase with change in season. Add on intranasal steroid and daily non drowsy antihistamine.   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 Never followed up with speech. Follow up with ENT as scheduled.   Exercise induced laryngeal obstruction (EILO) See above.   Nocturnal hypoxia Nocturnal hypoxia. She had repeat ONO and was advised to  continue 2 lpm supplemental O2 at night. She has since discontinued this AMA. She is trying to get oxygen from a different company still. Will let us know if she needs anything. Understands importance. Goal >88-90%   I spent 35 minutes of dedicated to the care of this patient on the date of this encounter to include pre-visit review of records, face-to-face time with the patient discussing conditions above, post visit ordering of testing, clinical documentation with the electronic health record, making appropriate referrals as documented, and communicating necessary findings to members of the patients care team.  Noemi Chapel, NP 09/08/2023  Pt aware and understands NP's role.

## 2023-09-08 NOTE — Assessment & Plan Note (Signed)
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 She has not had follow up imaging or PFTs. She is currently unable to afford these. She will notify us when she is able. Clinically stable. She does have increased use of SABA since discontinuing maintenance with LABA/LAMA. Will resume Stiolto and reassess response at follow up. Medication education provided. Teachback performed. Encouraged to work on graded exercises.  Patient Instructions  Continue Albuterol inhaler 2 puffs every 6 hours as needed for shortness of breath or wheezing. Notify if symptoms persist despite rescue inhaler/neb use. Continue omeprazole daily  Flonase nasal spray 2 sprays each nostril daily for nasal congestion/drainage Start a daily non drowsy allergy pill such as claritin, zyrtec, xyzal or allegra. Store brand version is fine Holiday representative 2 puffs daily  Schedule PFTs once able  Follow up in 6 weeks to see how inhaler is working with Dr. Jayme Cloud or Florentina Addison Thorne Wirz,NP. If symptoms do not improve or worsen, please contact office for sooner follow up or seek emergency care.

## 2023-09-08 NOTE — Assessment & Plan Note (Signed)
Slight increase with change in season. Add on intranasal steroid and daily non drowsy antihistamine.

## 2023-09-14 ENCOUNTER — Other Ambulatory Visit: Payer: Self-pay | Admitting: Physician Assistant

## 2023-09-15 ENCOUNTER — Other Ambulatory Visit: Payer: Self-pay | Admitting: Physician Assistant

## 2023-09-25 ENCOUNTER — Other Ambulatory Visit: Payer: Self-pay | Admitting: Physician Assistant

## 2023-10-17 ENCOUNTER — Other Ambulatory Visit: Payer: Self-pay | Admitting: Physician Assistant

## 2023-10-18 ENCOUNTER — Ambulatory Visit: Payer: 59 | Attending: Internal Medicine | Admitting: Internal Medicine

## 2023-10-18 ENCOUNTER — Encounter: Payer: Self-pay | Admitting: Internal Medicine

## 2023-10-18 DIAGNOSIS — M549 Dorsalgia, unspecified: Secondary | ICD-10-CM | POA: Diagnosis not present

## 2023-10-18 DIAGNOSIS — M542 Cervicalgia: Secondary | ICD-10-CM | POA: Diagnosis not present

## 2023-10-18 DIAGNOSIS — M1380 Other specified arthritis, unspecified site: Secondary | ICD-10-CM | POA: Diagnosis not present

## 2023-10-18 DIAGNOSIS — G8929 Other chronic pain: Secondary | ICD-10-CM | POA: Diagnosis not present

## 2023-10-18 MED ORDER — TIZANIDINE HCL 4 MG PO TABS: 4.0000 mg | ORAL_TABLET | Freq: Every evening | ORAL | 1 refills | Status: AC | PRN

## 2023-10-18 MED ORDER — HYDROXYCHLOROQUINE SULFATE 200 MG PO TABS: 400.0000 mg | ORAL_TABLET | Freq: Every day | ORAL | 1 refills | Status: DC

## 2023-10-18 NOTE — Progress Notes (Signed)
Office Visit Note  Patient: Bianca Michael             Date of Birth: 1966-09-23           MRN: [id number]             PCP: Mick Sell, MD Referring: Mick Sell, MD Visit Date: 10/18/2023   Subjective:  Follow-up   History of Present Illness: Bianca Michael is a 57 y.o. 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  Most of her arthritis symptoms are doing pretty well is having persistent neck pain recently.  No major flareup no visible hand swelling.  But she is noticing numbness and sometimes difficulty with gripping in her hands it does not feel exactly like carpal tunnel syndrome but she had surgical release for in the past.  Does not specifically feel any pain or numbness radiating from the neck down the arm.  She had another episode of sinusitis requiring antibiotics treatment since the last visit.  She has an appointment scheduled with neurosurgery clinic tomorrow regarding the neck pain also anticipates upcoming labs with nephrology follow-up next week.  Previous HPI 04/18/23 Bianca Michael is a 57 y.o. 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  Overall doing somewhat worse recently, with hand stiffness daily although better than when not treated at all.  She has been dealing with a difficult to clear sinusitis has done 3 rounds of antibiotics for this.  Has previously had issues with nasal polyps and called to resolve upper respiratory infections before.  Because joint pain issues right now are in the neck and throughout her back   Previous HPI 10/17/22 Bianca Michael is a 57 y.o. 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  Symptoms have been pretty stable since her last visit still feels like she is seeing an improvement in peripheral joint pain and stiffness.  Currently biggest issues in the neck low back and hip  areas.  These have never shown significant improvement so far since starting hydroxychloroquine.   Previous HPI 04/11/2022 Bianca Michael is a 57 y.o. 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 Overall has been doing pretty well but for past about 3 weeks has increased pain and stiffness in right hand and left elbow. Unable to fully extend and flex her left 3rd finger with some triggering. Left elbow feels okay in flexed position some more pain when extending and with rotational movements. She is taking tylenol as needed in addition to her hydroxychloroquine which helps partially.    Previous HPI 10/11/21 Bianca Michael is a 57 y.o. female here for follow up for seronegative arthritis after starting hydroxychloroquine 400 mg PO daily. At previous visit objective inflammation at the wrist suggestive for dequervains but also polyarthralgia and fatigue. Hand and wrist pain improved a lot now able to make tight fists, but has had some increase overall she associates with timing of weather changes. She sustained a fall after walking for a long time at the mall with no good location to sit for a break and struck the right side of her head. Currently bilateral shoulder pain is worse, especially on left. This has woken her from sleep a few times. No numbness down the arms.     Previous HPI: 07/26/21 Bianca Michael is a 57 y.o.  female here for evaluation of positive ANA with recent symptoms of joint pains, pulmonary embolus on xarelto, and peripheral ischemia in toes transiently attributed to vasopressor treatment with loss of toenails. 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 Overall she has been feeling in about usual health till becoming acutely ill earlier this year.  She does recall some bronchitis type symptoms preceding the January COVID illness and hospitalization.  She does not recall  much from that stay since she experienced significant portion of 2 months unconscious in ICU requiring tracheostomy for ventilator support after pulmonary embolus and collapsed lung.  After initially in the hospital she required extensive physical therapy with inability to walk or even transfer independently.  She does feel she has been more reliant on her upper extremity mobility during the initial period due to severe leg weakness. Currently her worst affected area is the right wrist with ongoing pain and swelling and decreased ability to flex and extend without pain. This has been persistently swollen for weeks, the left wrist hurts just intermittently and without visible swelling. She does have family history with first-degree relative and carrying diagnosis of lupus who has been treated with Plaquenil and prednisone.   Labs reviewed 06/2021 ANA pos dsDNA 13 RNP, Smith, SSA, SSB, chromatin, Jo-1, centromere neg RF 11.3 Uric acid 7.1 B2GP1 IgA/IgM/IgG neg   03/2021 B2GP1 IgG 60 ACA neg LA neg   Imaging reviewed 07/21/21 Xray right knee 4 views Mild osteoarthritis appears most advanced in the medial compartments. 07/21/21 Xray left knee 4 views Mild osteoarthritis appears most advanced in the medial compartments. 07/21/21 Xray right wrist No acute finding. Mild joint space narrowing at the radiocarpal joint in the region of the radial styloid and navicular consistent with mild osteoarthritis. 07/21/21 Xray chest 2 views Mild bronchial thickening. Streaky opacity at the left lung base, may be atelectasis or scarring. No confluent consolidation.   Review of Systems  Constitutional:  Positive for fatigue.  HENT:  Positive for mouth sores and mouth dryness.   Eyes:  Positive for dryness.  Respiratory:  Positive for shortness of breath.   Cardiovascular:  Positive for palpitations. Negative for chest pain.  Gastrointestinal:  Positive for constipation. Negative for blood in stool and  diarrhea.  Endocrine: Negative for increased urination.  Genitourinary:  Positive for involuntary urination.  Musculoskeletal:  Positive for joint pain, gait problem, joint pain, joint swelling, myalgias, muscle weakness, morning stiffness, muscle tenderness and myalgias.  Skin:  Positive for color change, hair loss and sensitivity to sunlight. Negative for rash.  Allergic/Immunologic: Positive for susceptible to infections.  Neurological:  Positive for dizziness and headaches.  Hematological:  Negative for swollen glands.  Psychiatric/Behavioral:  Positive for depressed mood and sleep disturbance. The patient is nervous/anxious.     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   Exercise induced laryngeal obstruction (EILO) 09/08/2023   Nocturnal hypoxia 09/08/2023   Iron deficiency 10/17/2022   Elevated serum creatinine 08/16/2021   Morbid obesity (HCC) 08/16/2021   Positive ANA (antinuclear antibody) 07/26/2021   Inflammatory arthritis 07/21/2021   Screening for heart disease 07/21/2021   COVID-19 07/21/2021   History of pulmonary embolism 07/21/2021   Anxiety 07/21/2021   Acute pulmonary embolism without acute cor pulmonale (HCC) 04/07/2021  Elevated liver enzymes 01/28/2021   Hyperlipidemia 01/21/2021   Chronic neck and back pain 03/24/2020   Essential (primary) hypertension 03/13/2020   Major depressive disorder 03/13/2020   Nasal polyp 04/02/2019   Thyroid disease 06/19/2018    Past Medical History:  Diagnosis Date   Anxiety    Chronic kidney disease    COVID-19 12/2020   Patient was hospitalized   Depression    Hyperlipidemia    Hypertension    Inflammatory arthritis    Pneumothorax    Left lung   Pulmonary embolism (HCC)    Thyroid disease     Family History  Problem Relation  Age of Onset   Breast cancer Mother 73   Colon cancer Mother    Heart attack Father    Heart disease Father    High Cholesterol Father    Thyroid disease Sister    Breast cancer Sister    Thyroid disease Sister    Thyroid disease Sister    Lupus Sister    Rheum arthritis Sister    Thyroid disease Daughter    Past Surgical History:  Procedure Laterality Date   CARPAL TUNNEL RELEASE     CHOLECYSTECTOMY     TONSILLECTOMY  1970   TUBAL LIGATION     Social History   Social History Narrative   Not on file   Immunization History  Administered Date(s) Administered   Hep A / Hep B 12/14/2021, 01/31/2022     Objective: Vital Signs: BP 113/77 (BP Location: Right Arm, Patient Position: Sitting, Cuff Size: Normal)   Pulse 73   Resp 14   Ht 5' 4.5" (1.638 m)   Wt 216 lb (98 kg)   BMI 36.50 kg/m    Physical Exam Constitutional:      Appearance: She is obese.  Eyes:     Conjunctiva/sclera: Conjunctivae normal.  Cardiovascular:     Rate and Rhythm: Normal rate and regular rhythm.  Pulmonary:     Effort: Pulmonary effort is normal.     Breath sounds: Normal breath sounds.  Musculoskeletal:     Right lower leg: No edema.     Left lower leg: No edema.  Lymphadenopathy:     Cervical: No cervical adenopathy.  Skin:    General: Skin is warm and dry.     Findings: No rash.  Neurological:     Mental Status: She is alert.  Psychiatric:        Mood and Affect: Mood normal.      Musculoskeletal Exam:  Neck tenderness to pressure midline and paraspinal muscles Shoulders full ROM no tenderness or swelling Elbows full ROM no tenderness or swelling Wrists full ROM no tenderness or swelling Fingers full ROM no tenderness or swelling Knees full ROM no tenderness or swelling, left knee crepitus   Investigation: No additional findings.  Imaging: No results found.  Recent Labs: Lab Results  Component Value Date   WBC 4.5 10/17/2022   HGB 15.1 10/17/2022   PLT 203  10/17/2022   NA 141 10/17/2022   K 4.5 10/17/2022   CL 104 10/17/2022   CO2 28 10/17/2022   GLUCOSE 85 10/17/2022   BUN 12 10/17/2022   CREATININE 1.24 (H) 10/17/2022   BILITOT 0.5 10/17/2022   ALKPHOS 106 02/23/2022   AST 29 10/17/2022   ALT 42 (H) 10/17/2022   PROT 7.4 10/17/2022   ALBUMIN 4.4 02/23/2022   CALCIUM 10.0 10/17/2022    Speciality Comments: PLQ Eye Exam 02/18/2023 W[facility]  f/u 12 months  Procedures:  No procedures performed Allergies: Patient has no known allergies.   Assessment / Plan:     Visit Diagnoses: Seronegative inflammatory arthritis - Plan: hydroxychloroquine (PLAQUENIL) 200 MG tablet  Joint inflammation appears well-controlled overall.  Agree with following up for her neurosurgery specialist regarding whether neck pain is contributing to hand symptoms as she does not have clear radiculopathy.  Plan to continue hydroxychloroquine 400 mg daily.  Chronic neck and back pain - Plan: tiZANidine (ZANAFLEX) 4 MG tablet  Has previously seen some benefit with muscle relaxer drugs for neck and back muscle pain when it is affecting sleep.  Send a prescription for tizanidine 4 mg tablet as needed.  Orders: No orders of the defined types were placed in this encounter.  Meds ordered this encounter  Medications   hydroxychloroquine (PLAQUENIL) 200 MG tablet    Sig: Take 2 tablets (400 mg total) by mouth daily.    Dispense:  180 tablet    Refill:  1   tiZANidine (ZANAFLEX) 4 MG tablet    Sig: Take 1 tablet (4 mg total) by mouth at bedtime as needed for muscle spasms.    Dispense:  90 tablet    Refill:  1     Follow-Up Instructions: Return in about 6 months (around 04/17/2024) for RA on HCQ f/u 6mos.   Fuller Plan, MD  Note - This record has been created using [employer].  Chart creation errors have been sought, but may not always  have been located. Such creation errors do not reflect on  the standard of medical care.

## 2023-10-19 ENCOUNTER — Encounter: Payer: Self-pay | Admitting: Neurosurgery

## 2023-10-19 ENCOUNTER — Ambulatory Visit: Payer: 59 | Admitting: Neurosurgery

## 2023-10-19 VITALS — BP 112/72 | Ht 64.5 in | Wt 213.0 lb

## 2023-10-19 DIAGNOSIS — M4802 Spinal stenosis, cervical region: Secondary | ICD-10-CM

## 2023-10-19 DIAGNOSIS — G959 Disease of spinal cord, unspecified: Secondary | ICD-10-CM | POA: Diagnosis not present

## 2023-10-19 NOTE — Progress Notes (Signed)
Referring Physician:  Mick Sell, MD [address] [place],  [place] [address]  Primary Physician:  Mick Sell, MD  History of Present Illness: 10/19/2023 Ms. Molloy presents for follow-up.  Her symptoms are relatively stable.  07/18/2023 Ms. Chaya Kibe is here today with a chief complaint of neck and back pain.  She has constant neck pain without clear shooting pains into her arms.  She has history of carpal tunnel release in both hands and has some persistent numbness.  This has been getting slightly worse.  She has some dexterity issues, particularly handling small items such as the backs of her earrings.  She has had some problems with balance, but no falls.  She she had a very serious bout with COVID in 2021.  She required tracheostomy placement, but has been decannulated.  She is using a heating pad to help with her pain.  Bowel/Bladder Dysfunction: none  Conservative measures:  Physical therapy:  has not participated, saw a chiropractor a few years ago  Multimodal medical therapy including regular antiinflammatories:  ibuprofen, flexeril, tramadol , zanaflex  Injections:  No epidural steroid injections  Past Surgery:  denies  Irelyn Rafael Mckillop has mild symptoms of cervical myelopathy.  Saw Drake Leach PA 07/12/23 07/12/2023 Ms. Tehani Tedder has a history of obesity, RA, history of PE when she had covid, hyperlipidemia, HTN, pulmonary fibrosis.    Last seen by me on 06/08/23 for chronic neck and back pain. She has known diffuse cervical spondylosis and cervical DDD with anterior spurring. Lumbar xrays from 2021 showed lumbar spondylosis and DDD with slip at L5-S1.    She is here to review MRI scans of her neck and lower back.    No change in her symptoms since her last visit. She continues with constant neck pain without radiation into the arms or hands. She does have a history of carpal tunnel release in both hands- has some numbness/tingling in  right > left hand. She notes some dexterity issues with the hands. She has some balance issues a well.    She also has constant LBP that radiates into the posterior right leg to her knee. No left leg pain. No numbness or tingling. She notes weakness in both legs. Pain is worse with standing, bending, and walking.    She is on PLAVIX.  The symptoms are causing a significant impact on the patient's life.   I have utilized the care everywhere function in epic to review the outside records available from external health systems.  Review of Systems:  A 10 point review of systems is negative, except for the pertinent positives and negatives detailed in the HPI.  Past Medical History: Past Medical History:  Diagnosis Date   Anxiety    Chronic kidney disease    COVID-19 12/2020   Patient was hospitalized   Depression    Hyperlipidemia    Hypertension    Inflammatory arthritis    Pneumothorax    Left lung   Pulmonary embolism (HCC)    Thyroid disease     Past Surgical History: Past Surgical History:  Procedure Laterality Date   CARPAL TUNNEL RELEASE     CHOLECYSTECTOMY     TONSILLECTOMY  1970   TUBAL LIGATION      Allergies: Allergies as of 10/19/2023   (No Known Allergies)    Medications:  Current Outpatient Medications:    albuterol (VENTOLIN HFA) 108 (90 Base) MCG/ACT inhaler, Inhale 2 puffs into the lungs every  4 (four) hours as needed for wheezing or shortness of breath., Disp: 18 g, Rfl: 2   amLODipine (NORVASC) 5 MG tablet, TAKE 1 TABLET (5 MG TOTAL) BY MOUTH DAILY. PLEASE SCHEDULE OFFICE VISIT FOR FURTHER REFILLS, Disp: 90 tablet, Rfl: 0   Ascorbic Acid (VITAMIN C) 1000 MG tablet, Take 1,000 mg by mouth daily., Disp: , Rfl:    Cholecalciferol (VITAMIN D3 ULTRA STRENGTH PO), Take 5,000 Int'l Units/day by mouth., Disp: , Rfl:    clopidogrel (PLAVIX) 75 MG tablet, Take 1 tablet (75 mg total) by mouth daily., Disp: 90 tablet, Rfl: 0   DENTAGEL 1.1 % GEL dental gel, Take  by mouth., Disp: , Rfl:    fluticasone (FLONASE) 50 MCG/ACT nasal spray, Place 2 sprays into both nostrils daily., Disp: 18.2 mL, Rfl: 2   hydroxychloroquine (PLAQUENIL) 200 MG tablet, Take 2 tablets (400 mg total) by mouth daily., Disp: 180 tablet, Rfl: 1   levothyroxine (SYNTHROID) 100 MCG tablet, , Disp: , Rfl:    liothyronine (CYTOMEL) 5 MCG tablet, Take by mouth., Disp: , Rfl:    losartan (COZAAR) 100 MG tablet, Take 100 mg by mouth daily., Disp: , Rfl:    magnesium oxide (MAG-OX) 400 MG tablet, Take by mouth., Disp: , Rfl:    MILK THISTLE PO, Take 1 capsule by mouth daily., Disp: , Rfl:    Multiple Vitamins-Minerals (WOMENS MULTI) CAPS, Take 1 capsule by mouth daily., Disp: , Rfl:    naltrexone (DEPADE) 50 MG tablet, Take 50 mg by mouth daily., Disp: , Rfl:    omeprazole (PRILOSEC) 40 MG capsule, TAKE 1 CAPSULE (40 MG TOTAL) BY MOUTH DAILY., Disp: 90 capsule, Rfl: 1   Semaglutide-Weight Management 0.5 MG/0.5ML SOAJ, Inject 0.5 mg into the skin. Inject 0.5 mLs (0.25 mg total) subcutaneously once a week, Disp: , Rfl:    Tiotropium Bromide-Olodaterol (STIOLTO RESPIMAT) 2.5-2.5 MCG/ACT AERS, Inhale 2 puffs into the lungs daily., Disp: 4 g, Rfl: 5   tiZANidine (ZANAFLEX) 4 MG tablet, Take 1 tablet (4 mg total) by mouth at bedtime as needed for muscle spasms., Disp: 90 tablet, Rfl: 1   zinc gluconate 50 MG tablet, Take 50 mg by mouth daily., Disp: , Rfl:    buPROPion HCl ER, XL, 450 MG TB24, Take by mouth., Disp: , Rfl:   Social History: Social History   Tobacco Use   Smoking status: Former    Current packs/day: 0.00    Average packs/day: 1 pack/day for 10.0 years (10.0 ttl pk-yrs)    Types: Cigarettes    Start date: 12/26/1990    Quit date: 12/26/2000    Years since quitting: 22.8    Passive exposure: Current   Smokeless tobacco: Never  Vaping Use   Vaping status: Never Used  Substance Use Topics   Alcohol use: Yes    Comment: very rarely   Drug use: Not Currently    Family Medical  History: Family History  Problem Relation Age of Onset   Breast cancer Mother 18   Colon cancer Mother    Heart attack Father    Heart disease Father    High Cholesterol Father    Thyroid disease Sister    Breast cancer Sister    Thyroid disease Sister    Thyroid disease Sister    Lupus Sister    Rheum arthritis Sister    Thyroid disease Daughter     Physical Examination: Vitals:   10/19/23 0925  BP: 112/72    General: Patient is in no  apparent distress. Attention to examination is appropriate.  Neck:   Supple.  Full range of motion.  Respiratory: Patient is breathing without any difficulty.   NEUROLOGICAL:     Awake, alert, oriented to person, place, and time.  Speech is clear and fluent.   Cranial Nerves: Pupils equal round and reactive to light.  Facial tone is symmetric.  Facial sensation is symmetric. Shoulder shrug is symmetric. Tongue protrusion is midline.  There is no pronator drift.  Strength: Side Biceps Triceps Deltoid Interossei Grip Wrist Ext. Wrist Flex.  R 5 5 5  4+ 4+ 5 5  L 5 5 5  4+ 4+ 5 5   Side Iliopsoas Quads Hamstring PF DF EHL  R 5 5 5 5 5 5   L 5 5 5 5 5 5    Reflexes are 1+ and symmetric at the biceps, triceps, brachioradialis, patella and achilles.   Hoffman's is absent.   Bilateral upper and lower extremity sensation is intact to light touch.    No evidence of dysmetria noted.  Gait is wide-based.     Medical Decision Making  Imaging: MR C spine 06/14/2023   IMPRESSION: 1. Multilevel cervical spondylosis with resultant severe diffuse spinal stenosis at C3-4 through C6-7. Secondary multifocal cord flattening at these levels. Suspected subtle associated cord signal changes at the level of C3-4, suspicious for compressive myelomalacia. 2. Multifactorial degenerative changes with resultant multilevel foraminal narrowing as above. Notable findings include moderate to severe left C3 foraminal stenosis, severe bilateral C4 through  C6 foraminal narrowing, with severe left and moderate right C7 foraminal stenosis. 3. Osteoarthritic changes about the skull base with associated reactive marrow edema. Finding could contribute to neck pain.     Electronically Signed   By: Rise Mu M.D.   On: 06/22/2023 04:37  MR L spine 06/14/2023  IMPRESSION: 1. At L4-5 there is a broad-based disc bulge. Moderate bilateral facet arthropathy. Bilateral lateral recess stenosis. Mild spinal stenosis. Severe bilateral foraminal stenosis. 2. At L5-S1 there is a mild broad-based disc bulge. Severe bilateral facet arthropathy. Moderate-severe bilateral foraminal stenosis. 3. At L3-4 there is a mild broad-based disc bulge. Moderate bilateral facet arthropathy. Mild bilateral foraminal stenosis. 4. No acute osseous injury of the lumbar spine.     Electronically Signed   By: Elige Ko M.D.   On: 06/22/2023 06:40  I have personally reviewed the images and agree with the above interpretation.  Assessment and Plan: Ms. Espericueta is a pleasant 57 y.o. female with clinical symptoms of cervical myelopathy.  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  We reviewed that there is no role for conservative management for cervical myelopathy.  Her symptoms are currently not too severe, so we will watch for now.  I think she is a candidate for C3-7 laminoplasty with foraminotomies, but she is not currently interested in pursuing surgical intervention.  She will continue to monitor her symptoms.  If her symptoms worsen, I will be happy to see her back in clinic to discuss her symptoms and consider surgical intervention.  I relayed to her that I would be happy to see her at any point in the future.  If she contact the office in any way, we will schedule her to discuss the above-named procedure or reevaluate depending on the length of time it is been.    We reviewed warning signs such as worsening  balance, worsening weakness, or worsening sensory changes in her upper extremities.  I spent a total of 10 minutes in this patient's care today. This time was spent reviewing pertinent records including imaging studies, obtaining and confirming history, performing a directed evaluation, formulating and discussing my recommendations, and documenting the visit within the medical record.    Thank you for involving me in the care of this patient.      Kristy Schomburg K. Myer Haff MD, [facility]surgery

## 2023-10-23 DIAGNOSIS — I10 Essential (primary) hypertension: Secondary | ICD-10-CM | POA: Diagnosis not present

## 2023-10-23 DIAGNOSIS — N1831 Chronic kidney disease, stage 3a: Secondary | ICD-10-CM | POA: Diagnosis not present

## 2023-10-23 NOTE — Progress Notes (Unsigned)
Cardiology Office Note    Date:  10/24/2023   ID:  Bianca Michael, DOB [birth year], MRN [id number]  PCP:  Mick Sell, MD  Cardiologist:  Debbe Odea, MD  Electrophysiologist:  None   Chief Complaint: Follow-up  History of Present Illness:   Bianca Michael is a 56 y.o. 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   She was admitted to the hospital in 12/2020 with COVID with multiple complications including acute hypoxic respiratory failure requiring intubation, ARF requiring temporary dialysis, right ventricular thrombus, PE, and secondary spontaneous pneumothorax treated with chest tube.  Echo during the admission showed preserved biventricular function.   PFTs in 08/2021 showed mild to moderate restrictive lung disease.  Echo in 09/2021 demonstrated an EF of 60 to 65%, no regional wall motion abnormalities, mild LVH, grade 1 diastolic dysfunction, normal RV systolic function, ventricular cavity size, and PASP, mild mitral regurgitation, and an estimated right atrial pressure of 3 mmHg.   She was initially evaluated by Dr. Azucena Cecil in 09/2021 and started on clopidogrel at that time for CAD.  Subsequent Lexiscan MPI in 10/2021 showed no evidence of ischemia or infarction and was found to be overall low risk.  CT attenuated corrected images showed mild aortic atherosclerosis and coronary artery calcifications.  She was seen in the office in 08/2022 and was without symptoms of angina or decompensation with noted stable chronic dyspnea.  She did note myalgias/arthralgias following the initiation of atorvastatin.  With  this, she had not been as active due to discomfort.  In this setting, atorvastatin was discontinued.  She also noted some positional dizziness following the initiation of Lopressor.  Given dizziness, Lopressor was discontinued.   She was seen in the office in 10/2022 and with stable chronic dyspnea.  She noted improvement in her myalgias/arthralgias following discontinuation of atorvastatin, though not resolution.  She had self-started red yeast rice.  She did note some palpitations following the initiation of amlodipine with subsequent Zio patch showing a predominant rhythm of sinus with an average rate of 81 bpm with rare PACs and PVCs.  Patient triggered events were associated with sinus rhythm.  She was last seen in the office on 01/17/2023 and continued to report a limited functional status secondary to low back pain and cramping in her foot/calf muscles.  She was not adherent to self initiated reduce rice and was not interested in pursuing labs or alternative lipid-lowering therapies.  She comes in and is doing well from a cardiac perspective, without symptoms of angina or cardiac decompensation.  She is dealing with some cervical spinal stenosis and with this note some positional dizziness/unsteadiness.  No frank syncope.  Chronic dyspnea is unchanged and dates back to COVID infection with underlying pulmonary fibrosis.  No lower extremity swelling or progressive orthopnea.  No longer taking red yeast rice.   Lipid therapy previously tried: Pravastatin, atorvastatin, rosuvastatin, simvastatin, ezetimibe led to myalgias   Labs independently reviewed: 07/2023 - TSH normal 04/2023 - A1c 5.6, TC 287, TG 233, HDL 53, LDL 187 03/2023 - Hgb 13.9, PLT 182, BUN 13, serum creatinine 1.32, potassium 3.9, albumin 4.0 09/2022 - AST normal, ALT 42  Past Medical History:  Diagnosis Date   Anxiety  Chronic kidney disease    COVID-19 12/2020   Patient was hospitalized   Depression    Hyperlipidemia     Hypertension    Inflammatory arthritis    Pneumothorax    Left lung   Pulmonary embolism (HCC)    Thyroid disease     Past Surgical History:  Procedure Laterality Date   CARPAL TUNNEL RELEASE     CHOLECYSTECTOMY     TONSILLECTOMY  1970   TUBAL LIGATION      Current Medications: Current Meds  Medication Sig   albuterol (VENTOLIN HFA) 108 (90 Base) MCG/ACT inhaler Inhale 2 puffs into the lungs every 4 (four) hours as needed for wheezing or shortness of breath.   amLODipine (NORVASC) 5 MG tablet TAKE 1 TABLET (5 MG TOTAL) BY MOUTH DAILY. PLEASE SCHEDULE OFFICE VISIT FOR FURTHER REFILLS   Ascorbic Acid (VITAMIN C) 1000 MG tablet Take 1,000 mg by mouth daily.   Bempedoic Acid (NEXLETOL) 180 MG TABS Take 1 tablet (180 mg total) by mouth daily.   buPROPion HCl ER, XL, 450 MG TB24 Take by mouth.   Cholecalciferol (VITAMIN D3 ULTRA STRENGTH PO) Take 5,000 Int'l Units/day by mouth.   clopidogrel (PLAVIX) 75 MG tablet Take 1 tablet (75 mg total) by mouth daily.   DENTAGEL 1.1 % GEL dental gel Take by mouth.   fluticasone (FLONASE) 50 MCG/ACT nasal spray Place 2 sprays into both nostrils daily.   hydroxychloroquine (PLAQUENIL) 200 MG tablet Take 2 tablets (400 mg total) by mouth daily.   levothyroxine (SYNTHROID) 100 MCG tablet    liothyronine (CYTOMEL) 5 MCG tablet Take by mouth.   losartan (COZAAR) 100 MG tablet Take 100 mg by mouth daily.   magnesium oxide (MAG-OX) 400 MG tablet Take by mouth.   MILK THISTLE PO Take 1 capsule by mouth daily.   Multiple Vitamins-Minerals (WOMENS MULTI) CAPS Take 1 capsule by mouth daily.   naltrexone (DEPADE) 50 MG tablet Take 50 mg by mouth daily.   omeprazole (PRILOSEC) 40 MG capsule TAKE 1 CAPSULE (40 MG TOTAL) BY MOUTH DAILY.   Semaglutide-Weight Management 0.5 MG/0.5ML SOAJ Inject 0.5 mg into the skin. Inject 0.5 mLs (0.25 mg total) subcutaneously once a week   Tiotropium Bromide-Olodaterol (STIOLTO RESPIMAT) 2.5-2.5 MCG/ACT AERS Inhale 2 puffs into  the lungs daily.   tiZANidine (ZANAFLEX) 4 MG tablet Take 1 tablet (4 mg total) by mouth at bedtime as needed for muscle spasms.   zinc gluconate 50 MG tablet Take 50 mg by mouth daily.    Allergies:   Patient has no known allergies.   Social History   Socioeconomic History   Marital status: Married    Spouse name: Not on file   Number of children: Not on file   Years of education: Not on file   Highest education level: Not on file  Occupational History   Not on file  Tobacco Use   Smoking status: Former    Current packs/day: 0.00    Average packs/day: 1 pack/day for 10.0 years (10.0 ttl pk-yrs)    Types: Cigarettes    Start date: 12/26/1990    Quit date: 12/26/2000    Years since quitting: 22.8    Passive exposure: Current   Smokeless tobacco: Never  Vaping Use   Vaping status: Never Used  Substance and Sexual Activity   Alcohol use: Yes    Comment: very rarely   Drug use: Not Currently   Sexual activity: Not Currently  Other Topics Concern   Not  on file  Social History Narrative   Not on file   Social Determinants of Health   Financial Resource Strain: Patient Declined (08/08/2023)   Received from [facility]   Overall Financial Resource Strain (CARDIA)    Difficulty of Paying Living Expenses: Patient declined  Food Insecurity: Patient Declined (08/08/2023)   Received from [facility]   Hunger Vital Sign    Worried About Running Out of Food in the Last Year: Patient declined    Ran Out of Food in the Last Year: Patient declined  Transportation Needs: Patient Declined (08/08/2023)   Received from [facility] - Transportation    In the past 12 months, has lack of transportation kept you from medical appointments or from getting medications?: Patient declined    Lack of Transportation (Non-Medical): Patient declined  Physical Activity: Not on file  Stress: Stress Concern Present (03/09/2021)   Received  from [facility] - Occupational Stress Questionnaire    Feeling of Stress : To some extent  Social Connections: Unknown (05/09/2022)   [facility]   Social Network    Social Network: Not on file     Family History:  The patient's family history includes Breast cancer in her sister; Breast cancer (age of onset: 85) in her mother; Colon cancer in her mother; Heart attack in her father; Heart disease in her father; High Cholesterol in her father; Lupus in her sister; Rheum arthritis in her sister; Thyroid disease in her daughter, sister, sister, and sister.  ROS:   12-point review of systems is negative unless otherwise noted in the HPI.   EKGs/Labs/Other Studies Reviewed:    Studies reviewed were summarized above. The additional studies were reviewed today:  Zio patch 10/2022: Patient had a min HR of 56 bpm, max HR of 137 bpm, and avg HR of 81 bpm. Predominant underlying rhythm was Sinus Rhythm. Isolated SVEs were rare (<1.0%), SVE Couplets were rare (<1.0%), and SVE Triplets were rare (<1.0%). Isolated VEs were rare (<1.0%,  16), VE Triplets were rare (<1.0%, 1), and no VE Couplets were present.    Conclusion Normal cardiac monitor with no significant arrhythmias,  patient triggered events associated with sinus rhythm. __________   Eugenie Birks MPI 11/01/2021:   The study is normal. The study is low risk.   No ST deviation was noted.   LV perfusion is normal. There is no evidence of ischemia. There is no evidence of infarction.   Left ventricular function is normal. End diastolic cavity size is normal. End systolic cavity size is normal.   CT attenuation images showed mild aortic and coronary calcifications. __________   2D echo 10/07/2021: 1. Left ventricular ejection fraction, by estimation, is 60 to 65%. The  left ventricle has normal function. The left ventricle has no regional  wall motion  abnormalities. There is mild left ventricular hypertrophy.  Left ventricular diastolic parameters  are consistent with Grade I diastolic dysfunction (impaired relaxation).   2. Right ventricular systolic function is normal. The right ventricular  size is normal. There is normal pulmonary artery systolic pressure. The  estimated right ventricular systolic pressure is 22.6 mmHg.   3. The mitral valve is normal in structure. Mild mitral valve  regurgitation. No evidence of mitral stenosis.   4. The aortic valve is normal in structure. Aortic valve regurgitation is  not visualized. No aortic stenosis is present.  5. The inferior vena cava is normal in size with greater than 50%  respiratory variability, suggesting right atrial pressure of 3 mmHg.   EKG:  EKG is ordered today.  The EKG ordered today demonstrates NSR, 68 bpm, nonspecific ST/T changes, consistent with prior tracings  Recent Labs: No results found for requested labs within last 365 days.  Recent Lipid Panel    Component Value Date/Time   CHOL 192 02/23/2022 0913   TRIG 273 (H) 02/23/2022 0913   HDL 51 02/23/2022 0913   CHOLHDL 3.8 02/23/2022 0913   LDLCALC 95 02/23/2022 0913    PHYSICAL EXAM:    VS:  BP 112/85 (BP Location: Left Arm, Patient Position: Sitting, Cuff Size: Normal)   Pulse 68   Ht 5' 4.5" (1.638 m)   Wt 214 lb 11.2 oz (97.4 kg)   SpO2 98%   BMI 36.28 kg/m   BMI: Body mass index is 36.28 kg/m.  Physical Exam Vitals reviewed.  Constitutional:      Appearance: She is well-developed.  HENT:     Head: Normocephalic and atraumatic.  Eyes:     General:        Right eye: No discharge.        Left eye: No discharge.  Neck:     Vascular: No JVD.  Cardiovascular:     Rate and Rhythm: Normal rate and regular rhythm.     Pulses:          Posterior tibial pulses are 2+ on the right side and 2+ on the left side.     Heart sounds: Normal heart sounds, S1 normal and S2 normal. Heart sounds not distant. No  midsystolic click and no opening snap. No murmur heard.    No friction rub.  Pulmonary:     Effort: Pulmonary effort is normal. No respiratory distress.     Breath sounds: Normal breath sounds. No decreased breath sounds, wheezing, rhonchi or rales.  Chest:     Chest wall: No tenderness.  Abdominal:     General: There is no distension.  Musculoskeletal:     Cervical back: Normal range of motion.     Right lower leg: No edema.     Left lower leg: No edema.  Skin:    General: Skin is warm and dry.     Nails: There is no clubbing.  Neurological:     Mental Status: She is alert and oriented to person, place, and time.  Psychiatric:        Speech: Speech normal.        Behavior: Behavior normal.        Thought Content: Thought content normal.        Judgment: Judgment normal.     Wt Readings from Last 3 Encounters:  10/24/23 214 lb 11.2 oz (97.4 kg)  10/19/23 213 lb (96.6 kg)  10/18/23 216 lb (98 kg)     ASSESSMENT & PLAN:   CAD involving the native coronary arteries without angina: She is doing well and without symptoms concerning for angina or cardiac decompensation.  Continue aggressive risk factor modification including clopidogrel and losartan.  No longer on statin therapy secondary to myalgias and arthralgias as outlined below.  No longer on metoprolol secondary to positional dizziness.  No indication for further ischemic testing.  HTN: Blood pressure is well-controlled in the office today.  She remains on amlodipine and losartan.  HLD with statin intolerance: LDL 187 in 04/2023.  She has documented myalgias/arthralgias with pravastatin,  simvastatin, atorvastatin, rosuvastatin, and ezetimibe.  We will undergo a trial of bempedoic acid 180 mg daily.  May need to pursue PCSK9 inhibitor.  OSA: Intolerant to CPAP.  This was not discussed today.    Disposition: F/u with Dr. Azucena Cecil or an APP in 4 months to follow-up on lipid-lowering therapy.   Medication  Adjustments/Labs and Tests Ordered: Current medicines are reviewed at length with the patient today.  Concerns regarding medicines are outlined above. Medication changes, Labs and Tests ordered today are summarized above and listed in the Patient Instructions accessible in Encounters.   Signed, Eula Listen, PA-C 10/24/2023 4:26 PM     [facility] [place] [address] [place], [place] [address] [phone]

## 2023-10-24 ENCOUNTER — Encounter: Payer: Self-pay | Admitting: Physician Assistant

## 2023-10-24 ENCOUNTER — Ambulatory Visit: Payer: 59 | Attending: Physician Assistant | Admitting: Physician Assistant

## 2023-10-24 VITALS — BP 112/85 | HR 68 | Ht 64.5 in | Wt 214.7 lb

## 2023-10-24 DIAGNOSIS — G4733 Obstructive sleep apnea (adult) (pediatric): Secondary | ICD-10-CM

## 2023-10-24 DIAGNOSIS — I10 Essential (primary) hypertension: Secondary | ICD-10-CM | POA: Diagnosis not present

## 2023-10-24 DIAGNOSIS — I2510 Atherosclerotic heart disease of native coronary artery without angina pectoris: Secondary | ICD-10-CM | POA: Diagnosis not present

## 2023-10-24 DIAGNOSIS — Z789 Other specified health status: Secondary | ICD-10-CM | POA: Diagnosis not present

## 2023-10-24 DIAGNOSIS — E785 Hyperlipidemia, unspecified: Secondary | ICD-10-CM | POA: Diagnosis not present

## 2023-10-24 MED ORDER — NEXLETOL 180 MG PO TABS: 180.0000 mg | ORAL_TABLET | Freq: Every day | ORAL | 0 refills | Status: DC

## 2023-10-24 NOTE — Patient Instructions (Signed)
Medication Instructions:  Your physician recommends the following medication changes.  START TAKING: Nexletol 180 mg daily for 1 week trial  *If you need a refill on your cardiac medications before your next appointment, please call your pharmacy*   Lab Work: None Ordered  If you have labs (blood work) drawn today and your tests are completely normal, you will receive your results only by: MyChart Message (if you have MyChart) OR A paper copy in the mail If you have any lab test that is abnormal or we need to change your treatment, we will call you to review the results.   Please ask your insurance about:  Praluent, Repatha, and Nexletol as therapies to lower your LDL    Follow-Up: At [facility], you and your health needs are our priority.  As part of our continuing mission to provide you with exceptional heart care, we have created designated Provider Care Teams.  These Care Teams include your primary Cardiologist (physician) and Advanced Practice Providers (APPs -  Physician Assistants and Nurse Practitioners) who all work together to provide you with the care you need, when you need it.  We recommend signing up for the patient portal called "MyChart".  Sign up information is provided on this After Visit Summary.  MyChart is used to connect with patients for Virtual Visits (Telemedicine).  Patients are able to view lab/test results, encounter notes, upcoming appointments, etc.  Non-urgent messages can be sent to your provider as well.   To learn more about what you can do with MyChart, go to [email or web address].    Your next appointment:   4 month(s)  Provider:   You may see Debbe Odea, MD or one of the following Advanced Practice Providers on your designated Care Team:   Eula Listen, [place]

## 2023-10-30 ENCOUNTER — Other Ambulatory Visit: Payer: Self-pay | Admitting: *Deleted

## 2023-10-30 MED ORDER — NEXLETOL 180 MG PO TABS: 180.0000 mg | ORAL_TABLET | Freq: Every day | ORAL | 3 refills | Status: DC

## 2023-11-07 ENCOUNTER — Ambulatory Visit (INDEPENDENT_AMBULATORY_CARE_PROVIDER_SITE_OTHER): Payer: No Typology Code available for payment source | Admitting: Nurse Practitioner

## 2023-11-07 ENCOUNTER — Encounter: Payer: Self-pay | Admitting: Nurse Practitioner

## 2023-11-07 VITALS — BP 94/66 | HR 81 | Temp 97.6°F | Ht 64.5 in | Wt 214.1 lb

## 2023-11-07 DIAGNOSIS — J3800 Paralysis of vocal cords and larynx, unspecified: Secondary | ICD-10-CM | POA: Diagnosis not present

## 2023-11-07 DIAGNOSIS — G4733 Obstructive sleep apnea (adult) (pediatric): Secondary | ICD-10-CM | POA: Insufficient documentation

## 2023-11-07 DIAGNOSIS — J386 Stenosis of larynx: Secondary | ICD-10-CM | POA: Diagnosis not present

## 2023-11-07 DIAGNOSIS — J8410 Pulmonary fibrosis, unspecified: Secondary | ICD-10-CM | POA: Diagnosis not present

## 2023-11-07 DIAGNOSIS — G4734 Idiopathic sleep related nonobstructive alveolar hypoventilation: Secondary | ICD-10-CM | POA: Diagnosis not present

## 2023-11-07 DIAGNOSIS — J302 Other seasonal allergic rhinitis: Secondary | ICD-10-CM | POA: Diagnosis not present

## 2023-11-07 NOTE — Assessment & Plan Note (Addendum)
Mild OSA; intolerant of CPAP. See above. Aware of risks of untreated sleep apnea and potential treatment options. Does not wish to repeat testing or pursue alternatives at this point. Safe driving practices reviewed.

## 2023-11-07 NOTE — Assessment & Plan Note (Signed)
See above

## 2023-11-07 NOTE — Assessment & Plan Note (Signed)
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 She has not had follow up imaging or PFTs. She is currently unable to afford these. She will notify us when she is able. Clinically stable. She had increased use of SABA so we restarted Stiolto, which has better managed her. Continue with surveillance.  Patient Instructions  Continue Albuterol inhaler 2 puffs every 6 hours as needed for shortness of breath or wheezing. Notify if symptoms persist despite rescue inhaler/neb use. Continue omeprazole daily Continue flonase nasal spray 2 sprays each nostril daily for nasal congestion/drainage Start a daily non drowsy allergy pill such as claritin, zyrtec, xyzal or allegra. Store brand version is fine Continue Stiolto 2 puffs daily   Schedule PFTs once able  We discussed how untreated sleep apnea puts an individual at risk for cardiac arrhthymias, pulm HTN, DM, stroke and increases their risk for daytime accidents; although these are minimal with mild severity sleep apnea. We also briefly reviewed treatment options including weight loss, side sleeping position, oral appliance, CPAP therapy   If you decide you want to repeat a sleep study and reconsider CPAP, let me know. We will hold off for now   Use caution when driving and pull over if you become sleepy   Follow up in 6 months with Dr. Jayme Cloud or Katie Rashaunda Rahl,NP. If symptoms do not improve or worsen, please contact office for sooner follow up or seek emergency care.

## 2023-11-07 NOTE — Assessment & Plan Note (Signed)
Improved control with addition of flonase. Encouraged her to restart non-drowsy daily antihistamine.

## 2023-11-07 NOTE — Assessment & Plan Note (Signed)
No change. Reached out to speech but never heard back. She does not wish to pursue this any further at this point. Follow up with ENT as scheduled.

## 2023-11-07 NOTE — Assessment & Plan Note (Signed)
Nocturnal hypoxia. She had repeat ONO and was advised to continue 2 lpm supplemental O2 at night. She has since discontinued this AMA. At this point, does not want to pursure any further testing. Goal >88-90%

## 2023-11-07 NOTE — Progress Notes (Signed)
@Patient  ID: Bianca Michael, female    DOB: 05-30-1966, 57 y.o.   MRN: [id number]  Chief Complaint  Patient presents with   Follow-up    Shortness of breath on exertion. No cough or wheezing.     Referring provider: Mick Sell, MD  HPI: 57 year old female, former smoker followed for postinflammatory fibrosis, vocal cord paresis, nocturnal hypoxia, exercise induced laryngeal obstruction. She is a patient of Dr. Jayme Cloud and last seen in office 09/08/2023 by [facility] NP. Past medical history significant for hx of PE, HTN, chronic sinusitis, thyroid disease, inflammatory arthritis, anxiety, HLD, depression, obesity.   TEST/EVENTS:  09/07/2021 PFT: FVC 75, FEV1 86, ratio 89, TLC 77, DLCO 85. No BD 09/21/2021 HRCT chest: atherosclerosis. Several densely calcified b/l hilar nodes. Multiple nodular areas of architectural distortion and btx, favored to represent post inflammatory scarring. Patchy ground glass attenuation, septal thickening. Air trapping. Could be indicative of early or mild ILD. Not UIP.  11/27/2021 HST: AHI 9/h, SpO2 low 84%  05/11/2023: OV with Dr. Jayme Cloud. Prolonged hospitalization for COVID in 2022 with intubation and subsequent tracheostomy. After removal of her trach, she has had persistent hoarseness. Previous studies with mild postinflammatory fibrosis. PFTs mostly restrictive. Using Stiolto which initially helped with cough and SOB but now ineffective. Eval with ENT with laryngoscopy exam - prolapse of left vocal cord. Referred to speech however did not follow through due to time constraints. SOB on inspiration with exertion. Question exercise induced laryngeal obstruction. Previously diagnosed with mild OSA; however, can not tolerate CPAP. Has been on nocturnal oxygen 3 lpm. Working on weight loss with PCP. Will d/c Stiolto due to ineffectiveness. Recommend pulmonary rehab but unable to afford. Reassess with PFTs. Clinically no progression.   09/08/2023: Ov with Livingston Denner NP  for follow up. Her breathing feels about the same as her last visit but is having to use her albuterol a little bit more frequently since she stopped Stiolto. Looking back, she does think this may have helped her some. She does not have any significant cough or chest congestion. Not noticing much wheezing. Voice hoarseness is unchanged. She is having a little more nasal congestion and drainage with the recent weather change. Mucus is clear. No sinus tenderness, headaches, or sick exposures. She was not able to do the PFTs previously ordered due to affordability. Using albuterol 2-3 times a day, most days.    11/07/2023: Today - follow up Patient presents today for follow up. She has been doing better with the Stiolto. She's not having to use her albuterol inhaler as much. Only having to use it once a week now. She otherwise feels about the same. She does not have any significant cough, chest congestion, wheezing. Voice hoarseness is unchanged from baseline. She never started the daily allergy pill. She does use the flonase, which has been helping. She can't afford PFTs right now.  She does have a history of mild sleep apnea. She had tried CPAP before but was not able to tolerate it. She does feel tired during the day. Dozing off when reading or coloring on her tablet, which is baseline for her. She denies any drowsy driving. She doesn't want to retry CPAP at this time. Doesn't want to repeat any testing right now due to cost. She's not wearing oxygen at night either.   No Known Allergies  Immunization History  Administered Date(s) Administered   Hep A / Hep B 12/14/2021, 01/31/2022    Past Medical History:  Diagnosis Date  Anxiety    Chronic kidney disease    COVID-19 12/2020   Patient was hospitalized   Depression    Hyperlipidemia    Hypertension    Inflammatory arthritis    Pneumothorax    Left lung   Pulmonary embolism (HCC)    Thyroid disease     Tobacco History: Social History    Tobacco Use  Smoking Status Former   Current packs/day: 0.00   Average packs/day: 1 pack/day for 10.0 years (10.0 ttl pk-yrs)   Types: Cigarettes   Start date: 12/26/1990   Quit date: 12/26/2000   Years since quitting: 22.8   Passive exposure: Current  Smokeless Tobacco Never   Counseling given: Not Answered   Outpatient Medications Prior to Visit  Medication Sig Dispense Refill   albuterol (VENTOLIN HFA) 108 (90 Base) MCG/ACT inhaler Inhale 2 puffs into the lungs every 4 (four) hours as needed for wheezing or shortness of breath. 18 g 2   amLODipine (NORVASC) 5 MG tablet TAKE 1 TABLET (5 MG TOTAL) BY MOUTH DAILY. PLEASE SCHEDULE OFFICE VISIT FOR FURTHER REFILLS 90 tablet 0   Ascorbic Acid (VITAMIN C) 1000 MG tablet Take 1,000 mg by mouth daily.     Bempedoic Acid (NEXLETOL) 180 MG TABS Take 1 tablet (180 mg total) by mouth daily. 7 tablet 0   Bempedoic Acid (NEXLETOL) 180 MG TABS Take 1 tablet (180 mg total) by mouth daily. 30 tablet 3   Cholecalciferol (VITAMIN D3 ULTRA STRENGTH PO) Take 5,000 Int'l Units/day by mouth.     clopidogrel (PLAVIX) 75 MG tablet Take 1 tablet (75 mg total) by mouth daily. 90 tablet 0   DENTAGEL 1.1 % GEL dental gel Take by mouth.     fluticasone (FLONASE) 50 MCG/ACT nasal spray Place 2 sprays into both nostrils daily. 18.2 mL 2   hydroxychloroquine (PLAQUENIL) 200 MG tablet Take 2 tablets (400 mg total) by mouth daily. 180 tablet 1   levothyroxine (SYNTHROID) 100 MCG tablet      liothyronine (CYTOMEL) 5 MCG tablet Take by mouth.     losartan (COZAAR) 100 MG tablet Take 100 mg by mouth daily.     magnesium oxide (MAG-OX) 400 MG tablet Take by mouth.     MILK THISTLE PO Take 1 capsule by mouth daily.     Multiple Vitamins-Minerals (WOMENS MULTI) CAPS Take 1 capsule by mouth daily.     naltrexone (DEPADE) 50 MG tablet Take 50 mg by mouth daily.     omeprazole (PRILOSEC) 40 MG capsule TAKE 1 CAPSULE (40 MG TOTAL) BY MOUTH DAILY. 90 capsule 1    Semaglutide-Weight Management 0.5 MG/0.5ML SOAJ Inject 0.5 mg into the skin. Inject 0.5 mLs (0.25 mg total) subcutaneously once a week     Tiotropium Bromide-Olodaterol (STIOLTO RESPIMAT) 2.5-2.5 MCG/ACT AERS Inhale 2 puffs into the lungs daily. 4 g 5   tiZANidine (ZANAFLEX) 4 MG tablet Take 1 tablet (4 mg total) by mouth at bedtime as needed for muscle spasms. 90 tablet 1   zinc gluconate 50 MG tablet Take 50 mg by mouth daily.     buPROPion HCl ER, XL, 450 MG TB24 Take by mouth.     No facility-administered medications prior to visit.     Review of Systems:   Constitutional: No weight loss or gain, night sweats, fevers, chills, or lassitude. +fatigue (baseline) HEENT: No headaches, difficulty swallowing, tooth/dental problems, or sore throat. No sneezing, itching, ear ache. +nasal congestion, post nasal drip (improved), hoarse voice  CV:  No chest pain, orthopnea, PND, swelling in lower extremities, anasarca, dizziness, palpitations, syncope Resp: +shortness of breath with exertion. No excess mucus or change in color of mucus. No productive or non-productive. No hemoptysis. No wheezing.  No chest wall deformity GI:  No heartburn, indigestion GU: No dysuria, change in color of urine, urgency or frequency.   Skin: No rash, lesions, ulcerations MSK:  No joint pain or swelling.   Neuro: No dizziness or lightheadedness.  Psych: No depression or anxiety. Mood stable.     Physical Exam:  BP 94/66 (BP Location: Left Arm, Patient Position: Sitting, Cuff Size: Normal)   Pulse 81   Temp 97.6 F (36.4 C) (Temporal)   Ht 5' 4.5" (1.638 m)   Wt 214 lb 1.6 oz (97.1 kg)   SpO2 98%   BMI 36.18 kg/m   GEN: Pleasant, interactive, well-appearing; obese; in no acute distress. HEENT:  Normocephalic and atraumatic. PERRLA. Sclera white. Nasal turbinates pink, moist and patent bilaterally. No rhinorrhea present. Oropharynx pink and moist, without exudate or edema. No lesions, ulcerations, or  postnasal drip. Hoarse voice quality NECK:  Supple w/ fair ROM. No JVD present. Normal carotid impulses w/o bruits. Thyroid symmetrical with no goiter or nodules palpated. No lymphadenopathy. Old tracheostomy site; well healed CV: RRR, no m/r/g, no peripheral edema. Pulses intact, +2 bilaterally. No cyanosis, pallor or clubbing. PULMONARY:  Unlabored, regular breathing. Clear bilaterally A&P w/o wheezes/rales/rhonchi. No accessory muscle use.  GI: BS present and normoactive. Soft, non-tender to palpation. No organomegaly or masses detected.  MSK: No erythema, warmth or tenderness. Cap refil <2 sec all extrem. No deformities or joint swelling noted.  Neuro: A/Ox3. No focal deficits noted.   Skin: Warm, no lesions or rashe Psych: Normal affect and behavior. Judgement and thought content appropriate.     Lab Results:  CBC    Component Value Date/Time   WBC 4.5 10/17/2022 1208   RBC 5.32 (H) 10/17/2022 1208   HGB 15.1 10/17/2022 1208   HGB 14.8 02/23/2022 0913   HCT 44.8 10/17/2022 1208   HCT 44.7 02/23/2022 0913   PLT 203 10/17/2022 1208   PLT 193 02/23/2022 0913   MCV 84.2 10/17/2022 1208   MCV 84 02/23/2022 0913   MCH 28.4 10/17/2022 1208   MCHC 33.7 10/17/2022 1208   RDW 13.1 10/17/2022 1208   RDW 13.9 02/23/2022 0913   LYMPHSABS 1,859 10/17/2022 1208   LYMPHSABS 2.4 02/23/2022 0913   MONOABS 0.4 02/17/2022 1026   EOSABS 158 10/17/2022 1208   EOSABS 0.3 02/23/2022 0913   BASOSABS 59 10/17/2022 1208   BASOSABS 0.1 02/23/2022 0913    BMET    Component Value Date/Time   NA 141 10/17/2022 1208   NA 141 02/23/2022 0913   K 4.5 10/17/2022 1208   CL 104 10/17/2022 1208   CO2 28 10/17/2022 1208   GLUCOSE 85 10/17/2022 1208   BUN 12 10/17/2022 1208   BUN 12 02/23/2022 0913   CREATININE 1.24 (H) 10/17/2022 1208   CALCIUM 10.0 10/17/2022 1208   GFRNONAA 48 (L) 02/17/2022 1026    BNP No results found for: "BNP"   Imaging:  No results found.  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  She has not had follow up imaging or PFTs. She is currently unable to afford these. She will notify us when she is able. Clinically stable. She had increased use of SABA so we restarted Stiolto, which has better managed her. Continue with surveillance.  Patient Instructions  Continue Albuterol inhaler 2 puffs every 6 hours as needed for shortness of breath or wheezing. Notify if symptoms persist despite rescue inhaler/neb use. Continue omeprazole daily Continue flonase nasal spray 2 sprays each nostril daily for nasal congestion/drainage Start a daily non drowsy allergy pill such as claritin, zyrtec, xyzal or allegra. Store brand version is fine Continue Stiolto 2 puffs daily   Schedule PFTs once able  We discussed how untreated sleep apnea puts an individual at risk for cardiac arrhthymias, pulm HTN, DM, stroke and increases their risk for daytime accidents; although these are minimal with mild severity sleep apnea. We also briefly reviewed treatment options including weight loss, side sleeping position, oral appliance, CPAP therapy   If you decide you want to repeat a sleep study and reconsider CPAP, let me know. We will hold off for now   Use caution when driving and pull over if you become sleepy   Follow up in 6 months with Dr. Jayme Cloud or Katie Khyre Germond,NP. If symptoms do not improve or worsen, please contact office for sooner follow up or seek emergency care.     Allergic rhinitis Improved control with addition of flonase. Encouraged her to restart non-drowsy daily antihistamine.   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 Reached out to speech but never heard back. She does not wish to pursue this any further at this point. Follow up with ENT as scheduled.   Exercise induced  laryngeal obstruction (EILO) See above.   Nocturnal hypoxia Nocturnal hypoxia. She had repeat ONO and was advised to continue 2 lpm supplemental O2 at night. She has since discontinued this AMA. At this point, does not want to pursure any further testing. Goal >88-90%   Mild obstructive sleep apnea Mild OSA; intolerant of CPAP. See above. Aware of risks of untreated sleep apnea and potential treatment options. Does not wish to repeat testing or pursue alternatives at this point. Safe driving practices reviewed.     I spent 28 minutes of dedicated to the care of this patient on the date of this encounter to include pre-visit review of records, face-to-face time with the patient discussing conditions above, post visit ordering of testing, clinical documentation with the electronic health record, making appropriate referrals as documented, and communicating necessary findings to members of the patients care team.  Noemi Chapel, NP 11/07/2023  Pt aware and understands NP's role.

## 2023-11-07 NOTE — Patient Instructions (Addendum)
Continue Albuterol inhaler 2 puffs every 6 hours as needed for shortness of breath or wheezing. Notify if symptoms persist despite rescue inhaler/neb use. Continue omeprazole daily Continue flonase nasal spray 2 sprays each nostril daily for nasal congestion/drainage Start a daily non drowsy allergy pill such as claritin, zyrtec, xyzal or allegra. Store brand version is fine Continue Stiolto 2 puffs daily   Schedule PFTs once able  We discussed how untreated sleep apnea puts an individual at risk for cardiac arrhthymias, pulm HTN, DM, stroke and increases their risk for daytime accidents; although these are minimal with mild severity sleep apnea. We also briefly reviewed treatment options including weight loss, side sleeping position, oral appliance, CPAP therapy   If you decide you want to repeat a sleep study and reconsider CPAP, let me know. We will hold off for now   Use caution when driving and pull over if you become sleepy   Follow up in 6 months with Dr. Jayme Cloud or Katie Jalexis Breed,NP. If symptoms do not improve or worsen, please contact office for sooner follow up or seek emergency care.

## 2023-11-13 NOTE — Progress Notes (Signed)
Agree with the details of the visit as noted by Katherine Cobb, NP. ° °C. Laura Yasin Ducat, MD °Advanced Bronchoscopy °[facility] ° °

## 2023-11-22 ENCOUNTER — Ambulatory Visit: Payer: 59

## 2023-11-22 ENCOUNTER — Ambulatory Visit
Admission: RE | Admit: 2023-11-22 | Discharge: 2023-11-22 | Disposition: A | Discharge status: Home / Self Care | Payer: 59 | Source: Ambulatory Visit | Attending: Emergency Medicine | Admitting: Emergency Medicine

## 2023-11-22 VITALS — BP 112/76 | HR 75 | Temp 97.8°F | Resp 17

## 2023-11-22 DIAGNOSIS — M549 Dorsalgia, unspecified: Secondary | ICD-10-CM | POA: Diagnosis not present

## 2023-11-22 DIAGNOSIS — N2889 Other specified disorders of kidney and ureter: Secondary | ICD-10-CM | POA: Diagnosis not present

## 2023-11-22 DIAGNOSIS — B3731 Acute candidiasis of vulva and vagina: Secondary | ICD-10-CM | POA: Diagnosis not present

## 2023-11-22 DIAGNOSIS — N390 Urinary tract infection, site not specified: Secondary | ICD-10-CM

## 2023-11-22 DIAGNOSIS — N760 Acute vaginitis: Secondary | ICD-10-CM

## 2023-11-22 DIAGNOSIS — B9689 Other specified bacterial agents as the cause of diseases classified elsewhere: Secondary | ICD-10-CM

## 2023-11-22 LAB — URINALYSIS, W/ REFLEX TO CULTURE (INFECTION SUSPECTED)
Glucose, UA: NEGATIVE mg/dL
Hgb urine dipstick: NEGATIVE
Leukocytes,Ua: NEGATIVE
Nitrite: NEGATIVE
Protein, ur: NEGATIVE mg/dL
RBC / HPF: NONE SEEN RBC/hpf (ref 0–5)
Specific Gravity, Urine: >1.03 — ABNORMAL HIGH (ref 1.005–1.030)
Squamous Epithelial / HPF: >50 /[HPF] (ref 0–5)
WBC, UA: NONE SEEN WBC/hpf (ref 0–5)
pH: 5.5 (ref 5.0–8.0)

## 2023-11-22 LAB — WET PREP, GENITAL
Sperm: NONE SEEN
Trich, Wet Prep: NONE SEEN
WBC, Wet Prep HPF POC: >10 — AB (ref <10)

## 2023-11-22 MED ORDER — FLUCONAZOLE 150 MG PO TABS: 150.0000 mg | ORAL_TABLET | Freq: Once | ORAL | 1 refills | Status: AC

## 2023-11-22 MED ORDER — CEFPODOXIME PROXETIL 200 MG PO TABS: 200.0000 mg | ORAL_TABLET | Freq: Two times a day (BID) | ORAL | 0 refills | Status: AC

## 2023-11-22 MED ORDER — CEFPODOXIME PROXETIL 200 MG PO TABS: 200.0000 mg | ORAL_TABLET | Freq: Two times a day (BID) | ORAL | 0 refills | Status: DC

## 2023-11-22 MED ORDER — METRONIDAZOLE 500 MG PO TABS: 500.0000 mg | ORAL_TABLET | Freq: Two times a day (BID) | ORAL | 0 refills | Status: AC

## 2023-11-22 NOTE — ED Provider Notes (Signed)
HPI  SUBJECTIVE:  Bianca Michael is a 57 y.o. female who presents with remittent, achy bilateral low back pain that lasts minutes with urinary urgency, frequency, dysuria and odorous urine for the past week.  The back pain does not migrate or radiate.  No nausea, vomiting, fevers, abdominal, pelvic pain, vaginal odor, bleeding or discharge.  No change in her baseline vaginal itching.  She has had symptoms like this before and was found to have a UTI  Patient has a past medical history of chronic kidney disease, hyperlipidemia, hypertension, PE  Past Medical History:  Diagnosis Date   Anxiety    Chronic kidney disease    COVID-19 12/2020   Patient was hospitalized   Depression    Hyperlipidemia    Hypertension    Inflammatory arthritis    Pneumothorax    Left lung   Pulmonary embolism (HCC)    Thyroid disease     Past Surgical History:  Procedure Laterality Date   CARPAL TUNNEL RELEASE     CHOLECYSTECTOMY     TONSILLECTOMY  1970   TUBAL LIGATION      Family History  Problem Relation Age of Onset   Breast cancer Mother 66   Colon cancer Mother    Heart attack Father    Heart disease Father    High Cholesterol Father    Thyroid disease Sister    Breast cancer Sister    Thyroid disease Sister    Thyroid disease Sister    Lupus Sister    Rheum arthritis Sister    Thyroid disease Daughter     Social History   Tobacco Use   Smoking status: Former    Current packs/day: 0.00    Average packs/day: 1 pack/day for 10.0 years (10.0 ttl pk-yrs)    Types: Cigarettes    Start date: 12/26/1990    Quit date: 12/26/2000    Years since quitting: 22.9    Passive exposure: Current   Smokeless tobacco: Never  Vaping Use   Vaping status: Never Used  Substance Use Topics   Alcohol use: Yes    Comment: very rarely   Drug use: Not Currently    No current facility-administered medications for this encounter.  Current Outpatient Medications:    albuterol (VENTOLIN HFA) 108  (90 Base) MCG/ACT inhaler, Inhale 2 puffs into the lungs every 4 (four) hours as needed for wheezing or shortness of breath., Disp: 18 g, Rfl: 2   amLODipine (NORVASC) 5 MG tablet, TAKE 1 TABLET (5 MG TOTAL) BY MOUTH DAILY. PLEASE SCHEDULE OFFICE VISIT FOR FURTHER REFILLS, Disp: 90 tablet, Rfl: 0   Ascorbic Acid (VITAMIN C) 1000 MG tablet, Take 1,000 mg by mouth daily., Disp: , Rfl:    Bempedoic Acid (NEXLETOL) 180 MG TABS, Take 1 tablet (180 mg total) by mouth daily., Disp: 7 tablet, Rfl: 0   Bempedoic Acid (NEXLETOL) 180 MG TABS, Take 1 tablet (180 mg total) by mouth daily., Disp: 30 tablet, Rfl: 3   Cholecalciferol (VITAMIN D3 ULTRA STRENGTH PO), Take 5,000 Int'l Units/day by mouth., Disp: , Rfl:    clopidogrel (PLAVIX) 75 MG tablet, Take 1 tablet (75 mg total) by mouth daily., Disp: 90 tablet, Rfl: 0   DENTAGEL 1.1 % GEL dental gel, Take by mouth., Disp: , Rfl:    fluconazole (DIFLUCAN) 150 MG tablet, Take 1 tablet (150 mg total) by mouth once for 1 dose. 1 tab po x 1. May repeat in 72 hours if no improvement, Disp: 2 tablet, Rfl:  1   fluticasone (FLONASE) 50 MCG/ACT nasal spray, Place 2 sprays into both nostrils daily., Disp: 18.2 mL, Rfl: 2   hydroxychloroquine (PLAQUENIL) 200 MG tablet, Take 2 tablets (400 mg total) by mouth daily., Disp: 180 tablet, Rfl: 1   levothyroxine (SYNTHROID) 100 MCG tablet, , Disp: , Rfl:    liothyronine (CYTOMEL) 5 MCG tablet, Take by mouth., Disp: , Rfl:    losartan (COZAAR) 100 MG tablet, Take 100 mg by mouth daily., Disp: , Rfl:    magnesium oxide (MAG-OX) 400 MG tablet, Take by mouth., Disp: , Rfl:    metroNIDAZOLE (FLAGYL) 500 MG tablet, Take 1 tablet (500 mg total) by mouth 2 (two) times daily for 7 days., Disp: 14 tablet, Rfl: 0   MILK THISTLE PO, Take 1 capsule by mouth daily., Disp: , Rfl:    Multiple Vitamins-Minerals (WOMENS MULTI) CAPS, Take 1 capsule by mouth daily., Disp: , Rfl:    naltrexone (DEPADE) 50 MG tablet, Take 50 mg by mouth daily., Disp: ,  Rfl:    omeprazole (PRILOSEC) 40 MG capsule, TAKE 1 CAPSULE (40 MG TOTAL) BY MOUTH DAILY., Disp: 90 capsule, Rfl: 1   Semaglutide-Weight Management 0.5 MG/0.5ML SOAJ, Inject 0.5 mg into the skin. Inject 0.5 mLs (0.25 mg total) subcutaneously once a week, Disp: , Rfl:    Tiotropium Bromide-Olodaterol (STIOLTO RESPIMAT) 2.5-2.5 MCG/ACT AERS, Inhale 2 puffs into the lungs daily., Disp: 4 g, Rfl: 5   tiZANidine (ZANAFLEX) 4 MG tablet, Take 1 tablet (4 mg total) by mouth at bedtime as needed for muscle spasms., Disp: 90 tablet, Rfl: 1   zinc gluconate 50 MG tablet, Take 50 mg by mouth daily., Disp: , Rfl:    buPROPion HCl ER, XL, 450 MG TB24, Take by mouth., Disp: , Rfl:    cefpodoxime (VANTIN) 200 MG tablet, Take 1 tablet (200 mg total) by mouth 2 (two) times daily for 7 days., Disp: 14 tablet, Rfl: 0  No Known Allergies   ROS  As noted in HPI.   Physical Exam  BP 112/76 (BP Location: Right Arm)   Pulse 75   Temp 97.8 F (36.6 C) (Oral)   Resp 17   SpO2 97%   Constitutional: Well developed, well nourished, no acute distress Eyes:  EOMI, conjunctiva normal bilaterally HENT: Normocephalic, atraumatic,mucus membranes moist Respiratory: Normal inspiratory effort Cardiovascular: Normal rate GI: nondistended soft.  Positive suprapubic, bilateral flank tenderness.  No other abdominal tenderness. Back: Bilateral mild CVAT, worse on the right.  Positive paralumbar tenderness-states this is not new or different.  No L-spine tenderness. skin: No rash, skin intact Musculoskeletal: no deformities Neurologic: Alert & oriented x 3, no focal neuro deficits Psychiatric: Speech and behavior appropriate   ED Course   Medications - No data to display  Orders Placed This Encounter  Procedures   Wet prep, genital    Standing Status:   Standing    Number of Occurrences:   1   Urine Culture    Standing Status:   Standing    Number of Occurrences:   1    Order Specific Question:   Indication     Answer:   Dysuria   DG Abd 1 View    Standing Status:   Standing    Number of Occurrences:   1    Order Specific Question:   Reason for Exam (SYMPTOM  OR DIAGNOSIS REQUIRED)    Answer:   Back pain, urinary symptoms, rule out nephrolithiasis   Urinalysis, w/ Reflex to  Culture (Infection Suspected) -Urine, Clean Catch    Standing Status:   Standing    Number of Occurrences:   1    Order Specific Question:   Specimen Source    Answer:   Urine, Clean Catch [76]    Order Specific Question:   Release to patient    Answer:   Immediate    Results for orders placed or performed during the hospital encounter of 11/22/23 (from the past 24 hour(s))  Urinalysis, w/ Reflex to Culture (Infection Suspected) -Urine, Clean Catch     Status: Abnormal   Collection Time: 11/22/23  4:08 PM  Result Value Ref Range   Specimen Source URINE, CLEAN CATCH    Color, Urine YELLOW YELLOW   APPearance CLEAR CLEAR   Specific Gravity, Urine >1.030 (H) 1.005 - 1.030   pH 5.5 5.0 - 8.0   Glucose, UA NEGATIVE NEGATIVE mg/dL   Hgb urine dipstick NEGATIVE NEGATIVE   Bilirubin Urine SMALL (A) NEGATIVE   Ketones, ur TRACE (A) NEGATIVE mg/dL   Protein, ur NEGATIVE NEGATIVE mg/dL   Nitrite NEGATIVE NEGATIVE   Leukocytes,Ua NEGATIVE NEGATIVE   Squamous Epithelial / HPF >50 0 - 5 /HPF   WBC, UA NONE SEEN 0 - 5 WBC/hpf   RBC / HPF NONE SEEN 0 - 5 RBC/hpf   Bacteria, UA MANY (A) NONE SEEN  Wet prep, genital     Status: Abnormal   Collection Time: 11/22/23  5:15 PM   Specimen: Vaginal  Result Value Ref Range   Yeast Wet Prep HPF POC PRESENT (A) NONE SEEN   Trich, Wet Prep NONE SEEN NONE SEEN   Clue Cells Wet Prep HPF POC PRESENT (A) NONE SEEN   WBC, Wet Prep HPF POC >10 (A) <10   Sperm NONE SEEN    No results found.  ED Clinical Impression  1. Urinary tract infection without hematuria, site unspecified   2. BV (bacterial vaginosis)   3. Yeast vaginitis      ED Assessment/Plan   {The patient has been seen  in Urgent Care in the last 3 years. :1}  Urine concentrated.  No hematuria.  Small bilirubin, trace ketones.  Contaminated specimen.  Negative nitrites, esterase, many bacteria no pyuria.  Will check wet prep and KUB.  Will check wet prep, KUB to evaluate for BV and nephrolithiasis.   Calculated creatinine clearance based on labs done from 10/28 79 mL/min  With the suprapubic, flank and CVA tenderness and many bacteria in the urine, I am concerned about acute complicated UTI.  She has had no fevers, has normal vitals here, has not taken any antipyretic in the past 6 hours, doubt pyelonephritis at this time.  Will treat as a UTI with cefpodoxime 200 mg twice daily for 7 days.  This does not qualify as a complicated UTI as she has no fevers, flank pain/CVAT with pyuria, no evidence of sepsis.  Do not need to renally adjust this.  She is to discontinue the cefpodoxime if urine culture is negative for UTI.  She also has a yeast infection and BV.  Will treat with Flagyl and Diflucan.do not need to renally dose either 1 of these.  Reviewed imaging independently.  Possible punctate calcification in the left kidney versus phlebolith.  No large radiopaque stone.  See radiology report for full details.  Will contact patient at [phone] if radiology read differs enough from my reading we need to change management.  Discussed labs, imaging, MDM, treatment plan, and plan for  follow-up with {Blank single:19197::"family","parent","patient"}. Discussed sn/sx that should prompt return to the ED. {Blank single:19197::"family","parent","patient"} agrees with plan.   Meds ordered this encounter  Medications   DISCONTD: cefpodoxime (VANTIN) 200 MG tablet    Sig: Take 1 tablet (200 mg total) by mouth 2 (two) times daily for 10 days.    Dispense:  20 tablet    Refill:  0   metroNIDAZOLE (FLAGYL) 500 MG tablet    Sig: Take 1 tablet (500 mg total) by mouth 2 (two) times daily for 7 days.    Dispense:  14  tablet    Refill:  0   fluconazole (DIFLUCAN) 150 MG tablet    Sig: Take 1 tablet (150 mg total) by mouth once for 1 dose. 1 tab po x 1. May repeat in 72 hours if no improvement    Dispense:  2 tablet    Refill:  1   cefpodoxime (VANTIN) 200 MG tablet    Sig: Take 1 tablet (200 mg total) by mouth 2 (two) times daily for 7 days.    Dispense:  14 tablet    Refill:  0      *This clinic note was created using Scientist, clinical (histocompatibility and immunogenetics). Therefore, there may be occasional mistakes despite careful proofreading.  ?

## 2023-11-22 NOTE — ED Triage Notes (Signed)
  Urinary frequency-back back. Burning with urination x 7 days.

## 2023-11-22 NOTE — Discharge Instructions (Signed)
I have sent your urine off for culture to make sure that this is a urinary tract infection and that we have you on the right antibiotic.  Discontinue the cefpodoxime if your urine culture comes back negative for urinary tract infection.  I did not appreciate anything obvious on your x-ray.  We will contact you if radiology sees anything abnormal and we need to change management.  Your wet prep was positive for bacterial vaginosis and yeast.  BV can certainly cause urinary symptoms.  I would start the Flagyl today.  You can wait to start the Diflucan if you want to-if you start having itching or discharge, then definitely start it.

## 2023-11-26 LAB — URINE CULTURE

## 2023-12-24 ENCOUNTER — Other Ambulatory Visit: Payer: Self-pay | Admitting: Physician Assistant

## 2024-01-05 DIAGNOSIS — E611 Iron deficiency: Secondary | ICD-10-CM | POA: Diagnosis not present

## 2024-01-05 DIAGNOSIS — I10 Essential (primary) hypertension: Secondary | ICD-10-CM | POA: Diagnosis not present

## 2024-01-05 DIAGNOSIS — G4733 Obstructive sleep apnea (adult) (pediatric): Secondary | ICD-10-CM | POA: Diagnosis not present

## 2024-01-05 DIAGNOSIS — E785 Hyperlipidemia, unspecified: Secondary | ICD-10-CM | POA: Diagnosis not present

## 2024-01-05 DIAGNOSIS — E079 Disorder of thyroid, unspecified: Secondary | ICD-10-CM | POA: Diagnosis not present

## 2024-01-05 DIAGNOSIS — M1990 Unspecified osteoarthritis, unspecified site: Secondary | ICD-10-CM | POA: Diagnosis not present

## 2024-01-05 DIAGNOSIS — E66813 Obesity, class 3: Secondary | ICD-10-CM | POA: Diagnosis not present

## 2024-01-05 DIAGNOSIS — Z86711 Personal history of pulmonary embolism: Secondary | ICD-10-CM | POA: Diagnosis not present

## 2024-01-05 DIAGNOSIS — J849 Interstitial pulmonary disease, unspecified: Secondary | ICD-10-CM | POA: Diagnosis not present

## 2024-01-05 DIAGNOSIS — F32A Depression, unspecified: Secondary | ICD-10-CM | POA: Diagnosis not present

## 2024-01-05 DIAGNOSIS — Z0000 Encounter for general adult medical examination without abnormal findings: Secondary | ICD-10-CM | POA: Diagnosis not present

## 2024-01-05 DIAGNOSIS — I2510 Atherosclerotic heart disease of native coronary artery without angina pectoris: Secondary | ICD-10-CM | POA: Diagnosis not present

## 2024-01-05 DIAGNOSIS — N1832 Chronic kidney disease, stage 3b: Secondary | ICD-10-CM | POA: Diagnosis not present

## 2024-01-17 ENCOUNTER — Other Ambulatory Visit: Payer: Self-pay

## 2024-01-17 MED ORDER — CLOPIDOGREL BISULFATE 75 MG PO TABS: 75.0000 mg | ORAL_TABLET | Freq: Every day | ORAL | 0 refills | Status: DC

## 2024-02-07 DIAGNOSIS — E039 Hypothyroidism, unspecified: Secondary | ICD-10-CM | POA: Diagnosis not present

## 2024-02-07 DIAGNOSIS — Z6834 Body mass index (BMI) 34.0-34.9, adult: Secondary | ICD-10-CM | POA: Diagnosis not present

## 2024-02-07 DIAGNOSIS — E6609 Other obesity due to excess calories: Secondary | ICD-10-CM | POA: Diagnosis not present

## 2024-02-07 DIAGNOSIS — E66811 Obesity, class 1: Secondary | ICD-10-CM | POA: Diagnosis not present

## 2024-02-19 NOTE — Progress Notes (Unsigned)
 Cardiology Office Note    Date:  02/20/2024   ID:  Mahira, Petras 1966/09/15, MRN [id number]  PCP:  Mick Sell, MD  Cardiologist:  Debbe Odea, MD  Electrophysiologist:  None   Chief Complaint: Follow-up  History of Present Illness:   Bianca Michael is a 58 y.o. 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   She was admitted to the hospital in 12/2020 with COVID with multiple complications including acute hypoxic respiratory failure requiring intubation, ARF requiring temporary dialysis, right ventricular thrombus, PE, and secondary spontaneous pneumothorax treated with chest tube.  Echo during the admission showed preserved biventricular function.   PFTs in 08/2021 showed mild to moderate restrictive lung disease.  Echo in 09/2021 demonstrated an EF of 60 to 65%, no regional wall motion abnormalities, mild LVH, grade 1 diastolic dysfunction, normal RV systolic function, ventricular cavity size, and PASP, mild mitral regurgitation, and an estimated right atrial pressure of 3 mmHg.   She was initially evaluated by Dr. Azucena Cecil in 09/2021 and started on clopidogrel at that time for CAD.  Subsequent Lexiscan MPI in 10/2021 showed no evidence of ischemia or infarction and was found to be overall low risk.  CT attenuated corrected images showed mild aortic atherosclerosis and coronary artery calcifications.  She was seen in the office in 08/2022 and was without symptoms of angina or decompensation with noted stable chronic dyspnea.  She did note myalgias/arthralgias following the initiation of atorvastatin.  With  this, she had not been as active due to discomfort.  In this setting, atorvastatin was discontinued.  She also noted some positional dizziness following the initiation of Lopressor.  Given dizziness, Lopressor was discontinued.   She was seen in the office in 10/2022 and with stable chronic dyspnea.  She noted improvement in her myalgias/arthralgias following discontinuation of atorvastatin, though not resolution.  She had self-started red yeast rice.  She did note some palpitations following the initiation of amlodipine with subsequent Zio patch showing a predominant rhythm of sinus with an average rate of 81 bpm with rare PACs and PVCs.  Patient triggered events were associated with sinus rhythm.   She was seen in the office on 01/17/2023 and continued to report a limited functional status secondary to low back pain and cramping in her foot/calf muscles.  She was not adherent to self initiated red yeast rice and was not interested in pursuing labs or alternative lipid-lowering therapies.  She was last seen in the office in 09/2023 and was without symptoms of angina or cardiac decompensation.  She was dealing with cervical spine stenosis and reported chronic stable dyspnea that was unchanged dating back to her COVID infection with underlying pulmonary fibrosis.  She was no longer taking red yeast rice.  It was recommended she undergo a trial of bempedoic acid.  She comes in doing well from a cardiac perspective and is without symptoms of angina or cardiac decompensation.  Chronic dyspnea is slightly improved.  No dizziness, presyncope, or syncope.  She has had some issues getting bempedoic acid refill due to supply at the pharmacy.  At times, she also skips the medication.  No significant lower extremity swelling.  Her weight is down 12 pounds today by our  scale on GLP-1 therapy.  Overall, she is doing well from a cardiac perspective and does not have any acute concerns at this time.   Labs independently  reviewed: 12/2023 - Hgb 15, PLT 234, potassium 4, BUN 13, serum creatinine 1.3, albumin 4.5, AST/ALT normal, TSH normal, TC 236, TG 177, HDL 53, LDL 147 04/2023 - A1c 5.6  Past Medical History:  Diagnosis Date   Anxiety    Chronic kidney disease    COVID-19 12/2020   Patient was hospitalized   Depression    Hyperlipidemia    Hypertension    Inflammatory arthritis    Pneumothorax    Left lung   Pulmonary embolism (HCC)    Thyroid disease     Past Surgical History:  Procedure Laterality Date   CARPAL TUNNEL RELEASE     CHOLECYSTECTOMY     TONSILLECTOMY  1970   TUBAL LIGATION      Current Medications: Current Meds  Medication Sig   albuterol (VENTOLIN HFA) 108 (90 Base) MCG/ACT inhaler Inhale 2 puffs into the lungs every 4 (four) hours as needed for wheezing or shortness of breath.   amLODipine (NORVASC) 5 MG tablet TAKE 1 TABLET (5 MG TOTAL) BY MOUTH DAILY. PLEASE SCHEDULE OFFICE VISIT FOR FURTHER REFILLS   Ascorbic Acid (VITAMIN C) 1000 MG tablet Take 1,000 mg by mouth daily.   buPROPion HCl ER, XL, 450 MG TB24 Take by mouth.   Cholecalciferol (VITAMIN D3 ULTRA STRENGTH PO) Take 5,000 Int'l Units/day by mouth.   clopidogrel (PLAVIX) 75 MG tablet Take 1 tablet (75 mg total) by mouth daily.   DENTAGEL 1.1 % GEL dental gel Take by mouth.   fluticasone (FLONASE) 50 MCG/ACT nasal spray Place 2 sprays into both nostrils daily.   hydroxychloroquine (PLAQUENIL) 200 MG tablet Take 2 tablets (400 mg total) by mouth daily.   levothyroxine (SYNTHROID) 100 MCG tablet    liothyronine (CYTOMEL) 5 MCG tablet Take by mouth.   losartan (COZAAR) 100 MG tablet Take 100 mg by mouth daily.   magnesium oxide (MAG-OX) 400 MG tablet Take by mouth.   MILK THISTLE PO Take 1 capsule by mouth daily.   Multiple Vitamins-Minerals (WOMENS MULTI) CAPS Take 1 capsule by mouth daily.   naltrexone (DEPADE) 50 MG tablet Take 50 mg by mouth daily.   omeprazole (PRILOSEC) 40 MG capsule TAKE 1 CAPSULE (40 MG  TOTAL) BY MOUTH DAILY.   Semaglutide-Weight Management 1 MG/0.5ML SOAJ Inject 1 mg into the skin. Inject 0.5 mLs (0.25 mg total) subcutaneously once a week   Tiotropium Bromide-Olodaterol (STIOLTO RESPIMAT) 2.5-2.5 MCG/ACT AERS Inhale 2 puffs into the lungs daily.   tiZANidine (ZANAFLEX) 4 MG tablet Take 1 tablet (4 mg total) by mouth at bedtime as needed for muscle spasms.   zinc gluconate 50 MG tablet Take 50 mg by mouth daily.   [DISCONTINUED] Bempedoic Acid (NEXLETOL) 180 MG TABS Take 1 tablet (180 mg total) by mouth daily.   [DISCONTINUED] Bempedoic Acid (NEXLETOL) 180 MG TABS Take 1 tablet (180 mg total) by mouth daily.    Allergies:   Patient has no known allergies.   Social History   Socioeconomic History   Marital status: Married    Spouse name: Not on file   Number of children: Not on file   Years of education: Not on file   Highest education level: Not on file  Occupational History   Not on file  Tobacco Use   Smoking status: Former    Current packs/day: 0.00  Average packs/day: 1 pack/day for 10.0 years (10.0 ttl pk-yrs)    Types: Cigarettes    Start date: 12/26/1990    Quit date: 12/26/2000    Years since quitting: 23.1    Passive exposure: Current   Smokeless tobacco: Never  Vaping Use   Vaping status: Never Used  Substance and Sexual Activity   Alcohol use: Yes    Comment: very rarely   Drug use: Not Currently   Sexual activity: Not Currently  Other Topics Concern   Not on file  Social History Narrative   Not on file   Social Drivers of Health   Financial Resource Strain: Patient Declined (08/08/2023)   Received [facility]   Overall Financial Resource Strain (CARDIA)    Difficulty of Paying Living Expenses: Patient declined  Food Insecurity: Patient Declined (08/08/2023)   Received from [facility]   Hunger Vital Sign    Worried About Running Out of Food in the Last Year: Patient declined    Ran Out of Food in  the Last Year: Patient declined  Transportation Needs: Patient Declined (08/08/2023)   Received from [facility] - Transportation    In the past 12 months, has lack of transportation kept you from medical appointments or from getting medications?: Patient declined    Lack of Transportation (Non-Medical): Patient declined  Physical Activity: Not on file  Stress: Stress Concern Present (03/09/2021)   Received from [facility] - Occupational Stress Questionnaire    Feeling of Stress : To some extent  Social Connections: Unknown (05/09/2022)   Received from [facility]   Social Network    Social Network: Not on file     Family History:  The patient's family history includes Breast cancer in her sister; Breast cancer (age of onset: 93) in her mother; Colon cancer in her mother; Heart attack in her father; Heart disease in her father; High Cholesterol in her father; Lupus in her sister; Rheum arthritis in her sister; Thyroid disease in her daughter, sister, sister, and sister.  ROS:   12-point review of systems is negative unless otherwise noted in the HPI.   EKGs/Labs/Other Studies Reviewed:    Studies reviewed were summarized above. The additional studies were reviewed today:   Zio patch 10/2022: Patient had a min HR of 56 bpm, max HR of 137 bpm, and avg HR of 81 bpm. Predominant underlying rhythm was Sinus Rhythm. Isolated SVEs were rare (<1.0%), SVE Couplets were rare (<1.0%), and SVE Triplets were rare (<1.0%). Isolated VEs were rare (<1.0%,  16), VE Triplets were rare (<1.0%, 1), and no VE Couplets were present.    Conclusion Normal cardiac monitor with no significant arrhythmias,  patient triggered events associated with sinus rhythm. __________   Eugenie Birks MPI 11/01/2021:   The study is normal. The study is low risk.   No ST deviation was noted.   LV perfusion is normal.  There is no evidence of ischemia. There is no evidence of infarction.   Left ventricular function is normal. End diastolic cavity size is normal. End systolic cavity size is normal.   CT attenuation images showed mild aortic and coronary calcifications. __________   2D echo 10/07/2021: 1. Left ventricular ejection fraction, by estimation, is 60 to 65%. The  left ventricle has normal function. The left ventricle has no regional  wall motion abnormalities. There is mild left ventricular hypertrophy.  Left ventricular diastolic parameters  are consistent with Grade I diastolic dysfunction (impaired relaxation).   2. Right ventricular systolic function is normal. The right ventricular  size is normal. There is normal pulmonary artery systolic pressure. The  estimated right ventricular systolic pressure is 22.6 mmHg.   3. The mitral valve is normal in structure. Mild mitral valve  regurgitation. No evidence of mitral stenosis.   4. The aortic valve is normal in structure. Aortic valve regurgitation is  not visualized. No aortic stenosis is present.   5. The inferior vena cava is normal in size with greater than 50%  respiratory variability, suggesting right atrial pressure of 3 mmHg.   EKG:  EKG is ordered today.  The EKG ordered today demonstrates NSR, 73 bpm, nonspecific ST-T changes, consistent with prior tracings  Recent Labs: No results found for requested labs within last 365 days.  Recent Lipid Panel    Component Value Date/Time   CHOL 192 02/23/2022 0913   TRIG 273 (H) 02/23/2022 0913   HDL 51 02/23/2022 0913   CHOLHDL 3.8 02/23/2022 0913   LDLCALC 95 02/23/2022 0913    PHYSICAL EXAM:    VS:  BP 110/70 (BP Location: Left Arm, Patient Position: Sitting, Cuff Size: Normal)   Pulse 73   Ht 5' 4.5" (1.638 m)   Wt 202 lb (91.6 kg)   BMI 34.14 kg/m   BMI: Body mass index is 34.14 kg/m.  Physical Exam Vitals reviewed.  Constitutional:      Appearance: She is  well-developed.  HENT:     Head: Normocephalic and atraumatic.  Eyes:     General:        Right eye: No discharge.        Left eye: No discharge.  Cardiovascular:     Rate and Rhythm: Normal rate and regular rhythm.     Pulses:          Posterior tibial pulses are 2+ on the right side and 2+ on the left side.     Heart sounds: Normal heart sounds, S1 normal and S2 normal. Heart sounds not distant. No midsystolic click and no opening snap. No murmur heard.    No friction rub.  Pulmonary:     Effort: Pulmonary effort is normal. No respiratory distress.     Breath sounds: Normal breath sounds. No decreased breath sounds, wheezing, rhonchi or rales.  Chest:     Chest wall: No tenderness.  Musculoskeletal:     Cervical back: Normal range of motion.     Right lower leg: No edema.     Left lower leg: No edema.  Skin:    General: Skin is warm and dry.     Nails: There is no clubbing.  Neurological:     Mental Status: She is alert and oriented to person, place, and time.  Psychiatric:        Speech: Speech normal.        Behavior: Behavior normal.        Thought Content: Thought content normal.        Judgment: Judgment normal.     Wt Readings from Last 3 Encounters:  02/20/24 202 lb (91.6 kg)  11/07/23 214 lb 1.6 oz (97.1 kg)  10/24/23 214 lb 11.2 oz (97.4 kg)     ASSESSMENT & PLAN:   CAD involving the native coronary arteries without angina: She continues to do well and is without symptoms concerning for angina or cardiac decompensation.  Continue aggressive risk  factor modification including clopidogrel and losartan.  No longer on statin therapy secondary to myalgias and arthralgias as outlined below.  Now on bempedoic acid, importance of daily therapy discussed.  No longer on metoprolol secondary to positional dizziness.  No indication for further ischemic testing at this time.  HTN: Blood pressure is well-controlled in the office today.  She remains on amlodipine 5 mg and  losartan 100 mg.  HLD with statin intolerance: LDL 147 in 12/2023.  She has documented myalgias/arthralgias with pravastatin, simvastatin, atorvastatin, rosuvastatin, and ezetimibe.  Tolerating bempedoic acid 180 mg daily.  However, she is having issues with the pharmaceutical supply and at times skips the dose.  Importance of daily medicine was discussed.  We will send in 90-day of bempedoic acid to assist with supply concerns at the pharmacy.  OSA: Intolerant to CPAP.  This was not discussed today.   Disposition: F/u with Dr. Lu Duffel or an APP in 6 months.   Medication Adjustments/Labs and Tests Ordered: Current medicines are reviewed at length with the patient today.  Concerns regarding medicines are outlined above. Medication changes, Labs and Tests ordered today are summarized above and listed in the Patient Instructions accessible in Encounters.   Signed, Eula Listen, PA-C 02/20/2024 5:33 PM     [facility] -  [address] [place], [place] [address] [phone]

## 2024-02-20 ENCOUNTER — Ambulatory Visit: Payer: 59 | Attending: Physician Assistant | Admitting: Physician Assistant

## 2024-02-20 ENCOUNTER — Encounter: Payer: Self-pay | Admitting: Physician Assistant

## 2024-02-20 VITALS — BP 110/70 | HR 73 | Ht 64.5 in | Wt 202.0 lb

## 2024-02-20 DIAGNOSIS — Z789 Other specified health status: Secondary | ICD-10-CM | POA: Diagnosis not present

## 2024-02-20 DIAGNOSIS — I10 Essential (primary) hypertension: Secondary | ICD-10-CM

## 2024-02-20 DIAGNOSIS — E785 Hyperlipidemia, unspecified: Secondary | ICD-10-CM

## 2024-02-20 DIAGNOSIS — I2510 Atherosclerotic heart disease of native coronary artery without angina pectoris: Secondary | ICD-10-CM | POA: Diagnosis not present

## 2024-02-20 MED ORDER — NEXLETOL 180 MG PO TABS: 180.0000 mg | ORAL_TABLET | Freq: Every day | ORAL | 3 refills | Status: AC

## 2024-02-20 NOTE — Patient Instructions (Signed)
 Medication Instructions:  Your Physician recommend you continue on your current medication as directed.    *If you need a refill on your cardiac medications before your next appointment, please call your pharmacy*   Lab Work: None ordered at this time   Follow-Up: At [facility], you and your health needs are our priority.  As part of our continuing mission to provide you with exceptional heart care, we have created designated Provider Care Teams.  These Care Teams include your primary Cardiologist (physician) and Advanced Practice Providers (APPs -  Physician Assistants and Nurse Practitioners) who all work together to provide you with the care you need, when you need it.     Your next appointment:   6 month(s)  -- please call us in the March/April time frame to set this up  Provider:   You may see Debbe Odea, MD or one of the following Advanced Practice Providers on your designated Care Team:   Eula Listen, [place]

## 2024-03-11 ENCOUNTER — Other Ambulatory Visit: Payer: Self-pay | Admitting: Infectious Diseases

## 2024-03-11 DIAGNOSIS — Z1231 Encounter for screening mammogram for malignant neoplasm of breast: Secondary | ICD-10-CM

## 2024-03-18 ENCOUNTER — Ambulatory Visit
Admission: RE | Admit: 2024-03-18 | Discharge: 2024-03-18 | Disposition: A | Discharge status: Home / Self Care | Source: Ambulatory Visit | Attending: Infectious Diseases | Admitting: Infectious Diseases

## 2024-03-18 DIAGNOSIS — Z1231 Encounter for screening mammogram for malignant neoplasm of breast: Secondary | ICD-10-CM | POA: Diagnosis not present

## 2024-03-21 ENCOUNTER — Encounter: Payer: Self-pay | Admitting: Pulmonary Disease

## 2024-03-21 ENCOUNTER — Ambulatory Visit (INDEPENDENT_AMBULATORY_CARE_PROVIDER_SITE_OTHER): Payer: No Typology Code available for payment source | Admitting: Pulmonary Disease

## 2024-03-21 VITALS — BP 108/76 | HR 85 | Temp 97.6°F | Ht 64.5 in | Wt 199.0 lb

## 2024-03-21 DIAGNOSIS — J3800 Paralysis of vocal cords and larynx, unspecified: Secondary | ICD-10-CM | POA: Diagnosis not present

## 2024-03-21 DIAGNOSIS — J386 Stenosis of larynx: Secondary | ICD-10-CM

## 2024-03-21 DIAGNOSIS — Z87891 Personal history of nicotine dependence: Secondary | ICD-10-CM

## 2024-03-21 DIAGNOSIS — J8410 Pulmonary fibrosis, unspecified: Secondary | ICD-10-CM

## 2024-03-21 NOTE — Patient Instructions (Signed)
 VISIT SUMMARY:  Today, we discussed the follow-up on your lung mass, which has been confirmed as lung cancer. The mass is large and cannot be operated on. We also reviewed your dementia, which affects your ability to communicate and make decisions about your care.  YOUR PLAN:  -LUNG CANCER: Lung cancer is a disease where cells in the lungs grow uncontrollably. Your lung cancer is large and cannot be operated on. Smoking has contributed to this condition. We will consider hospice care to help manage your symptoms and coordinate with palliative care services. We will also contact your next of kin, Bianca Michael, to discuss care options. A PET scan may be considered to identify biopsy sites, but general anesthesia is not feasible for you.  -DEMENTIA: Dementia is a condition that affects your memory and ability to think clearly. It complicates the management of your lung cancer and limits treatment options. We will coordinate with the nurse practitioner and staff at your facility to ensure you receive comprehensive care for your dementia and related needs.  INSTRUCTIONS:  We will contact your next of kin, Bianca Michael, to discuss your care options. Please continue to work with the nurse practitioner and staff at your facility for your ongoing care needs.

## 2024-03-21 NOTE — Progress Notes (Signed)
 Subjective:    Patient ID: Bianca Michael, female    DOB: Feb 16, 1966, 58 y.o.   MRN: [id number]  Patient Care Team: Mick Sell, MD as PCP - General (Infectious Diseases) Debbe Odea, MD as PCP - Cardiology (Cardiology) Salena Saner, MD as Consulting Physician (Pulmonary Disease)  Chief Complaint  Patient presents with   Follow-up    Shortness of breath on exertion. No cough or wheezing.     BACKGROUND/INTERVAL: Patient is a 58 year old remote former smoker who follows up for the issue of dyspnea after prolonged hospitalization for COVID-19 in January 2022 with intubation and subsequent tracheostomy.  After removal of her tracheostomy the patient has had persistent hoarseness.  She has had studies as noted below she has mild postinflammatory pulmonary fibrosis.  Her PFTs were mostly restrictive due to postinflammatory pulmonary fibrosis and obesity.   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  The right true cord was normal.  She was referred to speech pathology however she did not follow due to the time commitment.  She was also referred to pulmonary rehabilitation however felt that it was too expensive and she declined.  I last saw her on 11 May 2023, in the interim she has seen Micheline Maze, NP last on 07 November 2023.  She was instructed at that time to continue Stiolto and as needed albuterol.  HPI Discussed the use of AI scribe software for clinical note transcription with the patient, who gave verbal consent to proceed.  History of Present Illness   Bianca Michael "Bianca Michael" is a 58 year old female with a history of tracheostomy and prolonged ventilator use who presents with shortness of breath.  She experiences ongoing shortness of breath, particularly when taking deep breaths, which she describes as not getting a 'full lungful.' She also experiences  breathlessness when speaking, requiring her to pause frequently. This has been a persistent issue since her prolonged time on a ventilator and tracheostomy three years ago.  She has lost approximately fifty pounds since being diagnosed with sleep apnea, which has provided some relief to her symptoms, but she still experiences significant shortness of breath.  Previously, she was on high doses of statins, which caused severe muscle issues, including cramps and difficulty walking. Since discontinuing statins and switching to Nexletol, her muscle symptoms have improved, allowing her to walk more comfortably.  She mentions issues with short-term memory, often forgetting her intentions shortly after forming them. She suspects she may have had ADHD in the past, though it was never formally diagnosed. Additionally, she experiences episodes of nodding off while sitting, which is a new development for her.   She continues to have issues with intermittent hoarseness she never followed through with speech pathology.  She has not had any fevers, chills or sweats.  No cough or sputum production.  No orthopnea or paroxysmal nocturnal dyspnea.  She is consistent with using Stiolto however, finds that this really does not relieve her symptoms of dyspnea.  In the past she has been noted to have issues with obstructive sleep apnea and nocturnal hypoxemia however has declined therapy for both.     DATA: 09/07/2021  PFTs: FEV1 2.28 L or 86% predicted, FVC 2.57 L or 75% predicted, FEV1/FVC 89%, no bronchodilator response.  There is mild restrictive physiology due to ILD and/or obesity (ERV 24%) diffusion capacity was normal. 09/21/2021 CT chest high-resolution:  Possible early interstitial lung disease areas of architectural distortion and groundglass attenuation likely representing infectious or inflammatory scarring.  Mild bronchiectasis. Evidence of three-vessel coronary artery disease.  Severe hepatic  steatosis. 10/07/2021 2D echo: LVEF 60 to 65%, mild LVH.  Grade 1 DD.  Mild mitral regurg. 11/01/2021 Myoview: Normal stress test. 11/27/2021 Home sleep study: Mild obstructive sleep apnea with AHI of 9 and SPO2 low of 84%.  Review of Systems A 10 point review of systems was performed and it is as noted above otherwise negative.   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   Exercise induced laryngeal obstruction (EILO) 09/08/2023   Nocturnal hypoxia 09/08/2023   Iron deficiency 10/17/2022   Elevated serum creatinine 08/16/2021   Morbid obesity (HCC) 08/16/2021   Positive ANA (antinuclear antibody) 07/26/2021   Inflammatory arthritis 07/21/2021   Screening for heart disease 07/21/2021   COVID-19 07/21/2021   History of pulmonary embolism 07/21/2021   Anxiety 07/21/2021   Acute pulmonary embolism without acute cor pulmonale (HCC) 04/07/2021   Elevated liver enzymes 01/28/2021   Hyperlipidemia 01/21/2021   Chronic neck and back pain 03/24/2020   Essential (primary) hypertension 03/13/2020   Major depressive disorder 03/13/2020   Nasal polyp 04/02/2019   Thyroid disease 06/19/2018    Social History   Tobacco Use   Smoking status: Former    Current packs/day: 0.00    Average packs/day: 1 pack/day for 10.0 years (10.0 ttl pk-yrs)    Types: Cigarettes    Start date: 12/26/1990    Quit date: 12/26/2000    Years since quitting: 23.2    Passive exposure: Current   Smokeless tobacco: Never  Substance Use Topics   Alcohol use: Yes    Comment: very rarely    No Known Allergies  Current Meds  Medication Sig   albuterol (VENTOLIN HFA) 108 (90 Base) MCG/ACT inhaler Inhale 2 puffs into the lungs every 4 (four) hours as needed for wheezing or shortness of breath.   Ascorbic Acid (VITAMIN C) 1000 MG tablet Take 1,000  mg by mouth daily.   Bempedoic Acid (NEXLETOL) 180 MG TABS Take 1 tablet (180 mg total) by mouth daily.   Cholecalciferol (VITAMIN D3 ULTRA STRENGTH PO) Take 5,000 Int'l Units/day by mouth.   clopidogrel (PLAVIX) 75 MG tablet Take 1 tablet (75 mg total) by mouth daily.   fluticasone (FLONASE) 50 MCG/ACT nasal spray Place 2 sprays into both nostrils daily.   hydroxychloroquine (PLAQUENIL) 200 MG tablet Take 2 tablets (400 mg total) by mouth daily.   levothyroxine (SYNTHROID) 100 MCG tablet    losartan (COZAAR) 100 MG tablet Take 100 mg by mouth daily.   magnesium oxide (MAG-OX) 400 MG tablet Take by mouth.   MILK THISTLE PO Take 1 capsule by mouth daily.   Multiple Vitamins-Minerals (WOMENS MULTI) CAPS Take 1 capsule by mouth daily.   naltrexone (DEPADE) 50 MG tablet Take 50 mg by mouth daily.   omeprazole (PRILOSEC) 40 MG capsule TAKE 1 CAPSULE (40 MG TOTAL) BY MOUTH DAILY.   Semaglutide-Weight Management 1 MG/0.5ML SOAJ Inject 1 mg into the skin. Inject 0.5 mLs (0.25 mg total) subcutaneously once a week   Tiotropium Bromide-Olodaterol (STIOLTO RESPIMAT) 2.5-2.5 MCG/ACT AERS Inhale 2 puffs into the lungs daily.   tiZANidine (ZANAFLEX) 4 MG tablet Take 1 tablet (4 mg total) by mouth at bedtime  as needed for muscle spasms.   zinc gluconate 50 MG tablet Take 50 mg by mouth daily.   [DISCONTINUED] amLODipine (NORVASC) 5 MG tablet TAKE 1 TABLET (5 MG TOTAL) BY MOUTH DAILY. PLEASE SCHEDULE OFFICE VISIT FOR FURTHER REFILLS    Immunization History  Administered Date(s) Administered   Hep A / Hep B 12/14/2021, 01/31/2022        Objective:     BP 108/76 (BP Location: Left Arm, Patient Position: Sitting, Cuff Size: Normal)   Pulse 85   Temp 97.6 F (36.4 C) (Temporal)   Ht 5' 4.5" (1.638 m)   Wt 199 lb (90.3 kg)   SpO2 98%   BMI 33.63 kg/m   SpO2: 98 %  GENERAL: Obese woman, no acute distress, fully ambulatory, raspy voice, no conversational dyspnea.   HEAD: Normocephalic, atraumatic.   EYES: Pupils equal, round, reactive to light.  No scleral icterus.  MOUTH: Dentition intact, oral mucosa moist.  No thrush. NECK: Supple. No thyromegaly. Trachea midline. No JVD.  No adenopathy.  Well-healed tracheostomy scar. PULMONARY: Good air entry bilaterally.  No adventitious sounds. CARDIOVASCULAR: S1 and S2. Regular rate and rhythm.  Grade 1/6 mitral regurgitation murmur. ABDOMEN: Obese, well-healed PEG scar. MUSCULOSKELETAL: No joint deformity, no clubbing, no edema.  NEUROLOGIC: No focal deficit, no gait disturbance, speech is fluent. SKIN: Intact,warm,dry.  Mild discoloration of the toes distally, no frank ischemia. PSYCH: Mood and behavior normal   Assessment & Plan:     ICD-10-CM   1. Vocal cord paresis  J38.00 Ambulatory referral to Speech Therapy    2. Postinflammatory pulmonary fibrosis (HCC)  J84.10     3. Exercise induced laryngeal obstruction (EILO) - Probable  J38.6       Orders Placed This Encounter  Procedures   Ambulatory referral to Speech Therapy    Referral Priority:   Routine    Referral Type:   Speech Therapy    Referral Reason:   Specialty Services Required    Requested Specialty:   Speech Pathology    Number of Visits Requested:   1   Discussion:    Shortness of breath Persistent shortness of breath, particularly with deep breaths or speaking, likely due to anatomical changes and muscle weakness following prolonged intubation and tracheostomy. Weight loss of 50 pounds has provided some relief, but symptoms persist. 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 Voice therapy and breathing exercises are recommended to improve muscle tone and breathing technique. - Refer to voice therapy to improve muscle tone and breathing technique - Recommend using a harmonica to practice breathing with resistance  Laryngeal dysfunction Suspected mild laryngeal dysfunction due to loss of muscle tone in  the laryngeal area, likely a result of prolonged intubation and tracheostomy. This condition contributes to shortness of breath and intermittent voice loss. Voice therapy is expected to expedite regaining muscle tone and improving symptoms. - Refer to voice therapy through speech pathology to address laryngeal dystonia  Sleep apnea Sleep apnea may contribute to respiratory symptoms. Weight loss may alleviate some symptoms. Further evaluation of sleep apnea management may be warranted if symptoms persist.  Muscle weakness due to statin use Significant muscle weakness and cramps due to high-dose statin use have improved since discontinuation. She is now on Nexplatel, a non-statin medication, to manage cholesterol levels. Transition to Nexplatel is ongoing due to supply issues, but it is expected to manage cholesterol without adverse effects experienced with statins. - Continue Nexletol  for cholesterol management  Follow-up Follow-up to assess progress with voice therapy and overall respiratory function. - Schedule follow-up appointment in four months      Advised if symptoms do not improve or worsen, to please contact office for sooner follow up or seek emergency care.    I spent 30 minutes of dedicated to the care of this patient on the date of this encounter to include pre-visit review of records, face-to-face time with the patient discussing conditions above, post visit ordering of testing, clinical documentation with the electronic health record, making appropriate referrals as documented, and communicating necessary findings to members of the patients care team.     C. Danice Goltz, MD Advanced Bronchoscopy PCCM [facility]    *This note was generated using voice recognition software/Dragon and/or AI transcription program.  Despite best efforts to proofread, errors can occur which can change the meaning. Any transcriptional errors that result from this process are  unintentional and may not be fully corrected at the time of dictation.

## 2024-03-22 ENCOUNTER — Other Ambulatory Visit: Payer: Self-pay | Admitting: Physician Assistant

## 2024-03-26 ENCOUNTER — Ambulatory Visit: Attending: Pulmonary Disease | Admitting: Speech Pathology

## 2024-03-26 DIAGNOSIS — J3800 Paralysis of vocal cords and larynx, unspecified: Secondary | ICD-10-CM | POA: Insufficient documentation

## 2024-03-26 DIAGNOSIS — R490 Dysphonia: Secondary | ICD-10-CM | POA: Diagnosis not present

## 2024-03-26 NOTE — Therapy (Signed)
 OUTPATIENT SPEECH LANGUAGE PATHOLOGY  VOICE EVALUATION   Patient Name: Bianca Michael MRN: [id number] DOB:March 03, 1966, 58 y.o., female Today's Date: 03/26/2024  PCP: Clydie Braun, MD REFERRING PROVIDER: Sarina Ser, MD   End of Session - 03/26/24 1145     Visit Number 1    Number of Visits 17    Date for SLP Re-Evaluation 05/21/24    Authorization Type [employer]/Medicare A    Progress Note Due on Visit 10    SLP Start Time 1100    SLP Stop Time  1135    SLP Time Calculation (min) 35 min    Activity Tolerance Patient tolerated treatment well             Past Medical History:  Diagnosis Date   Anxiety    Chronic kidney disease    COVID-19 12/2020   Patient was hospitalized   Depression    Hyperlipidemia    Hypertension    Inflammatory arthritis    Pneumothorax    Left lung   Pulmonary embolism (HCC)    Thyroid disease    Past Surgical History:  Procedure Laterality Date   CARPAL TUNNEL RELEASE     CHOLECYSTECTOMY     TONSILLECTOMY  1970   TUBAL LIGATION     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   Exercise induced laryngeal obstruction (EILO) 09/08/2023   Nocturnal hypoxia 09/08/2023   Iron deficiency 10/17/2022   Elevated serum creatinine 08/16/2021   Morbid obesity (HCC) 08/16/2021   Positive ANA (antinuclear antibody) 07/26/2021   Inflammatory arthritis 07/21/2021   Screening for heart disease 07/21/2021   COVID-19 07/21/2021   History of pulmonary embolism 07/21/2021   Anxiety 07/21/2021   Acute pulmonary embolism without acute cor pulmonale (HCC) 04/07/2021   Elevated liver enzymes 01/28/2021   Hyperlipidemia 01/21/2021   Chronic neck and back pain 03/24/2020   Essential (primary) hypertension 03/13/2020   Major depressive disorder 03/13/2020    Nasal polyp 04/02/2019   Thyroid disease 06/19/2018    ONSET DATE:  2022; date of referral 03/21/2024  REFERRING DIAG: J38.00 (ICD-10-CM) - Vocal cord paresis   THERAPY DIAG:  Dysphonia  Rationale for Evaluation and Treatment Rehabilitation  SUBJECTIVE:   SUBJECTIVE STATEMENT: Pt pleasant, intermittent memory difficulty, she reports d/t COVID (2022) Pt accompanied by: self  PERTINENT HISTORY: Pt is a 58 year old female who experienced COVID infection with hospitalization for 2 months. Pt reports intubation resulting in trach. When decannulated, she reports dysphonia. She has been seen several times by ENT (Dr Elpidio Galea) with referral made for voice therapy. (None received at this time). ENT notes requested but not available during this assessment - per chart, pt has vocal cord paresis.    PAIN:  Are you having pain? No   FALLS: Has patient fallen in last 6 months? No,   LIVING ENVIRONMENT: Lives with: lives with their spouse Lives in: House/apartment  PLOF: Independent  PATIENT GOALS    to improve voice  OBJECTIVE:  COGNITION: Overall cognitive status: History of cognitive impairments - at baseline Areas of impairment:  Memory: Impaired: Short term Functional deficits: self=report of short-term memory loss d/t COVID 2022  SOCIAL HISTORY: Water intake: optimal Caffeine/alcohol intake: minimal Daily voice use: minimal Environmental risks: None reported Occupational risks: None identified Misuse: Excessively low pitch, Strain, Speaks without adequate breath  support, and Speaks on residual capacity Phonotraumatic behaviors: Excessive and/or habitual throat clearing  PERCEPTUAL VOICE ASSESSMENT: Voice quality: hoarse, breathy, strained, low vocal intensity, diplophonia, and vocal fatigue Vocal abuse: habitual throat clearing, abnormal breathing pattern, and habitual abnormal pitch Resonance: normal Respiratory function: clavicular breathing  OBJECTIVE VOICE  ASSESSMENT: Sustained "ah" maximum phonation time: 5.7 seconds Sustained "ah" loudness average: 70 dB Average fundamental frequency during sustained "ah":214 Hz   (1.5 SD below average of  244 Hz +/- 27 for gender)  Oral reading (passage) loudness average: 66 dB Oral reading loudness range: 16 dB Conversational pitch average: 171 Hz Highest dynamic pitch in conversational speech: 212 Hz Lowest dynamic pitch in conversational speech: 137 Hz Conversational pitch range: 75 Hz Conversational loudness average: 68 dB Conversational loudness range: 20 dB S/z ratio: 1.3 (Suggestive of dysfunction >1.0) Voice quality: hoarse, breathy, strained, low vocal intensity, diplophonia, and vocal fatigue     ORAL MOTOR EXAMINATION Facial : WFL Lingual: WFL Velum: WFL Mandible: WFL Cough: WFL    PATIENT REPORTED OUTCOME MEASURES (PROM):  VOICE HANDICAP INDEX (VHI)  The Voice Handicap Index is comprised of a series of questions to assess the patient's perception of their voice. It is designed to evaluate the emotional, physical and functional components of the voice problem.  Functional: 21 Physical: 36 Emotional: 19 Total: 76 (Normal mean 8.75, SD =14.97)  z score =  4.5 - severe = 3.00+   TODAY'S TREATMENT:  N/A   PATIENT EDUCATION: Education details: results of this assessment, possible need for ENT re-assessment, ST POC Person educated: Patient Education method: Explanation Education comprehension: needs further education   HOME EXERCISE PROGRAM: N/A     GOALS: Goals reviewed with patient? Yes  SHORT TERM GOALS: Target date: 10 sessions  Pt will identify healthy voice alternatives and ways to promote vocal health in 80% of opportunities across 3 data collections.  Baseline: Goal status: INITIAL  2.  Pt will recognize their own vocal abuse behaviors during therapy sessions in 80% of opportunities across 3 data sessions.  Baseline:  Goal status: INITIAL   LONG  TERM GOALS: Target date: 05/21/2024  The patient will demonstrate abdominal breathing patterns and steady release of breath on exhalation to optimize efficiency of voicing and decrease laryngeal hyperfunction.  Baseline:  Goal status: INITIAL  2.  The patient will decrease laryngeal and articulatory muscle tension by independently completing relaxation/stretching exercises.  Baseline:  Goal status: INITIAL  3.  The patient will demonstrate independent understanding of vocal hygiene concepts.  Baseline:  Goal status: INITIAL   ASSESSMENT:  CLINICAL IMPRESSION: Patient is a 57 y.o. female who was seen today for a voice evaluation d/t potential report of vocal cord paresis.  Pt presents with moderate dysphonia that is c/b hoarse, breathy, strain, reduced pitch in addition to reduced vocal intensity d/t poor respiratory support.   Before initiating skilled voice therapy will recommend follow up with ENT. This Clinical research associate will request most recent ENT notes.   OBJECTIVE IMPAIRMENTS include voice disorder. These impairments are limiting patient from effectively communicating at home and in community. Factors affecting potential to achieve goals and functional outcome are ability to learn/carryover information and time post onset, decreased ability to attend intensive voice rehab . Patient will benefit from skilled SLP services to address above impairments and improve overall function.  REHAB POTENTIAL: Good  PLAN: SLP FREQUENCY: 1-2x/week  SLP DURATION: 8 weeks  PLANNED INTERVENTIONS: SLP instruction and feedback, Compensatory strategies, and Patient/family education  Izyk Marty B. Dreama Saa, M.S., CCC-SLP, Tree surgeon Certified Brain Injury Specialist [facility]  [facility] Rehabilitation Services Office [phone] Ascom [phone] Fax [phone]

## 2024-03-28 ENCOUNTER — Ambulatory Visit: Admitting: Speech Pathology

## 2024-03-28 ENCOUNTER — Telehealth: Payer: Self-pay | Admitting: Speech Pathology

## 2024-03-28 NOTE — Telephone Encounter (Signed)
 This left the following message on 03/27/2024 and on 03/28/2024   This writer received note from ENT office dated 12/2022. Secure message received from Dr Elenore Rota indicating that given the time since last evaluation, it was most appropriate for pt to be seen by ENT prior to treatment to help in developing most current treatment plan.   Pt instructed to call ENT, schedule an appt and let our office know of appt date. Further ST services will be scheduled at that time.   Mehlani Blankenburg B. Dreama Saa, M.S., CCC-SLP, Tree surgeon Certified Brain Injury Specialist [facility]  [facility] Rehabilitation Services Office [phone] Ascom [phone] Fax [phone]

## 2024-03-29 ENCOUNTER — Telehealth: Payer: Self-pay | Admitting: Speech Pathology

## 2024-03-29 NOTE — Telephone Encounter (Signed)
 This Clinical research associate received call back from pt. She informs that she has scheduled appt with ENT for 04/02/2024 but as of this time, her husband has some appts d/t surgery. She will call and reschedule ST services once she is able to attend on regular basis.   Kyra Laffey B. Dreama Saa, M.S., CCC-SLP, Tree surgeon Certified Brain Injury Specialist [facility]  [facility] Rehabilitation Services Office [phone] Ascom [phone] Fax [phone]

## 2024-04-01 DIAGNOSIS — R490 Dysphonia: Secondary | ICD-10-CM | POA: Diagnosis not present

## 2024-04-01 DIAGNOSIS — J4520 Mild intermittent asthma, uncomplicated: Secondary | ICD-10-CM | POA: Diagnosis not present

## 2024-04-01 DIAGNOSIS — J301 Allergic rhinitis due to pollen: Secondary | ICD-10-CM | POA: Diagnosis not present

## 2024-04-02 ENCOUNTER — Ambulatory Visit: Admitting: Speech Pathology

## 2024-04-04 ENCOUNTER — Ambulatory Visit: Admitting: Speech Pathology

## 2024-04-05 NOTE — Progress Notes (Deleted)
 Office Visit Note  Patient: Bianca Michael             Date of Birth: 08/16/1966           MRN: [id number]             PCP: Mick Sell, MD Referring: Mick Sell, MD Visit Date: 04/17/2024   Subjective:  No chief complaint on file.   History of Present Illness: Bianca Michael is a 58 y.o. 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    Previous HPI 10/18/2023 Bianca Michael is a 58 y.o. 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  Most of her arthritis symptoms are doing pretty well is having persistent neck pain recently.  No major flareup no visible hand swelling.  But she is noticing numbness and sometimes difficulty with gripping in her hands it does not feel exactly like carpal tunnel syndrome but she had surgical release for in the past.  Does not specifically feel any pain or numbness radiating from the neck down the arm.  She had another episode of sinusitis requiring antibiotics treatment since the last visit.  She has an appointment scheduled with neurosurgery clinic tomorrow regarding the neck pain also anticipates upcoming labs with nephrology follow-up next week.   Previous HPI 04/18/23 Bianca Michael is a 58 y.o. 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  Overall doing somewhat worse recently, with hand stiffness daily although better than when not treated at all.  She has been dealing with a difficult to clear sinusitis has done 3 rounds of antibiotics for this.  Has previously had issues with nasal polyps and called to resolve upper respiratory infections before.  Because joint pain issues right now are in the neck and throughout her back   Previous HPI 10/17/22 Bianca Michael is a 58 y.o. 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  Symptoms have been pretty stable  since her last visit still feels like she is seeing an improvement in peripheral joint pain and stiffness.  Currently biggest issues in the neck low back and hip areas.  These have never shown significant improvement so far since starting hydroxychloroquine.   Previous HPI 04/11/2022 Bianca Michael is a 58 y.o. 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 Overall has been doing pretty well but for past about 3 weeks has increased pain and stiffness in right hand and left elbow. Unable to fully extend and flex her left 3rd finger with some triggering. Left elbow feels okay in flexed position some more pain when extending and with rotational movements. She is taking tylenol as needed in addition to her hydroxychloroquine which helps partially.    Previous HPI 10/11/21 Bianca Michael is a 58 y.o. female here for follow up for seronegative arthritis after starting hydroxychloroquine 400 mg PO daily. At previous visit objective inflammation at the wrist suggestive for dequervains but also polyarthralgia and fatigue. Hand and wrist pain improved a lot now able to make tight fists, but has had some increase overall she associates with timing of weather changes. She sustained a fall after walking for a long time at the mall with no good location to sit for a break and struck the right side of her head. Currently bilateral shoulder pain is worse,  especially on left. This has woken her from sleep a few times. No numbness down the arms.     Previous HPI: 07/26/21 Bianca Michael is a 58 y.o. female here for evaluation of positive ANA with recent symptoms of joint pains, pulmonary embolus on xarelto, and peripheral ischemia in toes transiently attributed to vasopressor treatment with loss of toenails. 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 Overall she has been feeling in about usual health till  becoming acutely ill earlier this year.  She does recall some bronchitis type symptoms preceding the January COVID illness and hospitalization.  She does not recall much from that stay since she experienced significant portion of 2 months unconscious in ICU requiring tracheostomy for ventilator support after pulmonary embolus and collapsed lung.  After initially in the hospital she required extensive physical therapy with inability to walk or even transfer independently.  She does feel she has been more reliant on her upper extremity mobility during the initial period due to severe leg weakness. Currently her worst affected area is the right wrist with ongoing pain and swelling and decreased ability to flex and extend without pain. This has been persistently swollen for weeks, the left wrist hurts just intermittently and without visible swelling. She does have family history with first-degree relative and carrying diagnosis of lupus who has been treated with Plaquenil and prednisone.   Labs reviewed 06/2021 ANA pos dsDNA 13 RNP, Smith, SSA, SSB, chromatin, Jo-1, centromere neg RF 11.3 Uric acid 7.1 B2GP1 IgA/IgM/IgG neg   03/2021 B2GP1 IgG 60 ACA neg LA neg   Imaging reviewed 07/21/21 Xray right knee 4 views Mild osteoarthritis appears most advanced in the medial compartments. 07/21/21 Xray left knee 4 views Mild osteoarthritis appears most advanced in the medial compartments. 07/21/21 Xray right wrist No acute finding. Mild joint space narrowing at the radiocarpal joint in the region of the radial styloid and navicular consistent with mild osteoarthritis. 07/21/21 Xray chest 2 views Mild bronchial thickening. Streaky opacity at the left lung base, may be atelectasis or scarring. No confluent consolidation.   No Rheumatology ROS completed.   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   Exercise induced laryngeal obstruction (EILO) 09/08/2023   Nocturnal hypoxia 09/08/2023   Iron deficiency 10/17/2022   Elevated serum creatinine 08/16/2021   Morbid obesity (HCC) 08/16/2021   Positive ANA (antinuclear antibody) 07/26/2021   Inflammatory arthritis 07/21/2021   Screening for heart disease 07/21/2021   COVID-19 07/21/2021   History of pulmonary embolism 07/21/2021   Anxiety 07/21/2021   Acute pulmonary embolism without acute cor pulmonale (HCC) 04/07/2021   Elevated liver enzymes 01/28/2021   Hyperlipidemia 01/21/2021   Chronic neck and back pain 03/24/2020   Essential (primary) hypertension 03/13/2020   Major depressive disorder 03/13/2020   Nasal polyp 04/02/2019   Thyroid disease 06/19/2018    Past Medical History:  Diagnosis Date   Anxiety    Chronic kidney disease    COVID-19 12/2020   Patient was hospitalized   Depression    Hyperlipidemia    Hypertension    Inflammatory arthritis    Pneumothorax    Left lung   Pulmonary embolism (HCC)    Thyroid disease     Family History  Problem Relation Age  of Onset   Breast cancer Mother 23   Colon cancer Mother    Heart attack Father    Heart disease Father    High Cholesterol Father    Thyroid disease Sister    Breast cancer Sister    Thyroid disease Sister    Thyroid disease Sister    Lupus Sister    Rheum arthritis Sister    Thyroid disease Daughter    Past Surgical History:  Procedure Laterality Date   CARPAL TUNNEL RELEASE     CHOLECYSTECTOMY     TONSILLECTOMY  1970   TUBAL LIGATION     Social History   Social History Narrative   Not on file   Immunization History  Administered Date(s) Administered   Hep A / Hep B 12/14/2021, 01/31/2022     Objective: Vital Signs: There were no vitals taken for this visit.   Physical Exam   Musculoskeletal Exam: ***  CDAI Exam: CDAI Score: -- Patient Global:  --; Provider Global: -- Swollen: --; Tender: -- Joint Exam 04/17/2024   No joint exam has been documented for this visit   There is currently no information documented on the homunculus. Go to the Rheumatology activity and complete the homunculus joint exam.  Investigation: No additional findings.  Imaging: MM 3D SCREENING MAMMOGRAM BILATERAL BREAST Result Date: 03/20/2024 CLINICAL DATA:  Screening. EXAM: DIGITAL SCREENING BILATERAL MAMMOGRAM WITH TOMOSYNTHESIS AND CAD TECHNIQUE: Bilateral screening digital craniocaudal and mediolateral oblique mammograms were obtained. Bilateral screening digital breast tomosynthesis was performed. The images were evaluated with computer-aided detection. COMPARISON:  Previous exam(s). ACR Breast Density Category a: The breasts are almost entirely fatty. FINDINGS: There are no findings suspicious for malignancy. IMPRESSION: No mammographic evidence of malignancy. A result letter of this screening mammogram will be mailed directly to the patient. RECOMMENDATION: Screening mammogram in one year. (Code:SM-B-01Y) BI-RADS CATEGORY  1: Negative. Electronically Signed   By: Frederico Hamman M.D.   On: 03/20/2024 07:40    Recent Labs: Lab Results  Component Value Date   WBC 4.5 10/17/2022   HGB 15.1 10/17/2022   PLT 203 10/17/2022   NA 141 10/17/2022   K 4.5 10/17/2022   CL 104 10/17/2022   CO2 28 10/17/2022   GLUCOSE 85 10/17/2022   BUN 12 10/17/2022   CREATININE 1.24 (H) 10/17/2022   BILITOT 0.5 10/17/2022   ALKPHOS 106 02/23/2022   AST 29 10/17/2022   ALT 42 (H) 10/17/2022   PROT 7.4 10/17/2022   ALBUMIN 4.4 02/23/2022   CALCIUM 10.0 10/17/2022    Speciality Comments: PLQ Eye Exam 02/18/2023 WNL [facility] f/u 12 months  Procedures:  No procedures performed Allergies: Patient has no known allergies.   Assessment / Plan:     Visit Diagnoses: No diagnosis found.  ***  Orders: No orders of the defined types were placed in this  encounter.  No orders of the defined types were placed in this encounter.    Follow-Up Instructions: No follow-ups on file.   Metta Clines, RT  Note - This record has been created using [employer].  Chart creation errors have been sought, but may not always  have been located. Such creation errors do not reflect on  the standard of medical care.

## 2024-04-08 ENCOUNTER — Ambulatory Visit: Admitting: Speech Pathology

## 2024-04-10 ENCOUNTER — Ambulatory Visit: Admitting: Speech Pathology

## 2024-04-15 DIAGNOSIS — N1831 Chronic kidney disease, stage 3a: Secondary | ICD-10-CM | POA: Diagnosis not present

## 2024-04-15 DIAGNOSIS — I10 Essential (primary) hypertension: Secondary | ICD-10-CM | POA: Diagnosis not present

## 2024-04-16 ENCOUNTER — Ambulatory Visit: Admitting: Speech Pathology

## 2024-04-17 ENCOUNTER — Ambulatory Visit: Payer: Medicare Other | Admitting: Internal Medicine

## 2024-04-17 DIAGNOSIS — Z79899 Other long term (current) drug therapy: Secondary | ICD-10-CM

## 2024-04-17 DIAGNOSIS — M1380 Other specified arthritis, unspecified site: Secondary | ICD-10-CM

## 2024-04-17 DIAGNOSIS — M549 Dorsalgia, unspecified: Secondary | ICD-10-CM

## 2024-04-18 ENCOUNTER — Ambulatory Visit: Admitting: Speech Pathology

## 2024-04-19 ENCOUNTER — Other Ambulatory Visit: Payer: Self-pay

## 2024-04-19 MED ORDER — CLOPIDOGREL BISULFATE 75 MG PO TABS: 75.0000 mg | ORAL_TABLET | Freq: Every day | ORAL | 0 refills | Status: DC

## 2024-04-23 ENCOUNTER — Ambulatory Visit: Admitting: Speech Pathology

## 2024-04-26 ENCOUNTER — Ambulatory Visit: Admitting: Speech Pathology

## 2024-04-27 ENCOUNTER — Other Ambulatory Visit: Payer: Self-pay | Admitting: Internal Medicine

## 2024-04-27 DIAGNOSIS — M1380 Other specified arthritis, unspecified site: Secondary | ICD-10-CM

## 2024-04-29 NOTE — Telephone Encounter (Signed)
 Please schedule patient a follow up visit. Patient due 04/17/2024. Thanks!

## 2024-04-29 NOTE — Telephone Encounter (Signed)
 Last Fill: 10/18/2023  Eye exam: 02/18/2023 WNL   Labs: 04/15/2024 CBC Neutrophils Absolute 1,466  01/05/2024 CMP Creatinine 1.3 eGFR 47  Next Visit: Due 04/17/2024. Message sent to the front to schedule.   Last Visit: 10/18/2023  GE:[id number] inflammatory arthritis   Current Dose per office note 10/18/2023: hydroxychloroquine  400 mg daily.   Attempted to contact the patient and left a message to call the office back regarding a PLQ eye exam and to schedule an appointment.   Okay to refill Plaquenil ?

## 2024-04-29 NOTE — Telephone Encounter (Signed)
LMOM to reschedule follow-up appointment. 

## 2024-04-30 ENCOUNTER — Ambulatory Visit: Admitting: Speech Pathology

## 2024-05-02 ENCOUNTER — Ambulatory Visit: Admitting: Speech Pathology

## 2024-05-06 ENCOUNTER — Ambulatory Visit: Admitting: Speech Pathology

## 2024-05-07 ENCOUNTER — Ambulatory Visit: Admitting: Internal Medicine

## 2024-05-09 ENCOUNTER — Ambulatory Visit: Admitting: Speech Pathology

## 2024-05-14 ENCOUNTER — Ambulatory Visit: Admitting: Speech Pathology

## 2024-05-16 ENCOUNTER — Ambulatory Visit: Admitting: Speech Pathology

## 2024-05-22 ENCOUNTER — Encounter: Payer: Self-pay | Admitting: Internal Medicine

## 2024-05-22 ENCOUNTER — Ambulatory Visit: Attending: Internal Medicine | Admitting: Internal Medicine

## 2024-05-22 VITALS — BP 104/70 | HR 74 | Resp 14 | Ht 64.5 in | Wt 194.0 lb

## 2024-05-22 DIAGNOSIS — J8410 Pulmonary fibrosis, unspecified: Secondary | ICD-10-CM | POA: Diagnosis not present

## 2024-05-22 DIAGNOSIS — R768 Other specified abnormal immunological findings in serum: Secondary | ICD-10-CM

## 2024-05-22 DIAGNOSIS — M1990 Unspecified osteoarthritis, unspecified site: Secondary | ICD-10-CM | POA: Diagnosis not present

## 2024-05-22 DIAGNOSIS — M1380 Other specified arthritis, unspecified site: Secondary | ICD-10-CM

## 2024-05-22 MED ORDER — HYDROXYCHLOROQUINE SULFATE 200 MG PO TABS: 400.0000 mg | ORAL_TABLET | Freq: Every day | ORAL | 1 refills | Status: DC

## 2024-05-22 NOTE — Progress Notes (Signed)
 Office Visit Note  Patient: Bianca Michael             Date of Birth: 06/15/1966           MRN: [id number]             PCP: Eartha Gold, MD Referring: Eartha Gold, MD Visit Date: 05/22/2024   Subjective:  Follow-up   Discussed the use of AI scribe software for clinical note transcription with the patient, who gave verbal consent to proceed.  History of Present Illness   Bianca Michael is a 58 y.o. 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  She has ongoing neck issues with her spinal cord described as being compressed. Previous evaluations suggested surgery as a potential option, while more conservative procedural treatments like injections may not be beneficial.  She experiences intermittent throat and voice issues, with her voice fluctuating. She is considering starting voice therapy now that her schedule has opened up.  She describes stiffness in her hands and joints, with her feet being more bothersome than her hands. She attributes some toe issues to nerve damage from a severe case of COVID-19, during which her toes turned black, and she lost toenails. Her toes can be painful when pressure is applied, such as from a blanket, but there is no recent discoloration.  She has been taking hydroxychloroquine  and reports no recent illnesses or major flare-ups. When she does get sick, it takes longer than usual to recover, often experiencing relapses.  She has experienced significant weight loss, over sixty pounds, which she attributes to taking Locovi, despite it causing nausea.  She reports dry eyes and mouth, using drops for her eyes but still experiencing dryness an hour later. Her feet and ankles swell when not elevated, and she has a history of foot problems from standing on cement floors, including plantar fasciitis-like symptoms.  Her thyroid  levels are within the normal range, but she has felt unusually cold this  winter, which she attributes to age.       Previous HPI 10/18/23 Bianca Michael is a 58 y.o. 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  Most of her arthritis symptoms are doing pretty well is having persistent neck pain recently.  No major flareup no visible hand swelling.  But she is noticing numbness and sometimes difficulty with gripping in her hands it does not feel exactly like carpal tunnel syndrome but she had surgical release for in the past.  Does not specifically feel any pain or numbness radiating from the neck down the arm.  She had another episode of sinusitis requiring antibiotics treatment since the last visit.  She has an appointment scheduled with neurosurgery clinic tomorrow regarding the neck pain also anticipates upcoming labs with nephrology follow-up next week.   Previous HPI 04/18/23 Bianca Michael is a 58 y.o. 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  Overall doing somewhat worse recently, with hand stiffness daily although better than when not treated at all.  She has been dealing with a difficult to clear sinusitis has done 3 rounds of antibiotics for this.  Has previously had issues with nasal polyps and called to resolve upper respiratory infections before.  Because joint pain issues right now are in the neck and throughout her back   Previous HPI 10/17/22 Bianca Michael is a 58 y.o. 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  Symptoms have been pretty stable since her last visit still feels like she is seeing an improvement in peripheral joint pain and stiffness.  Currently biggest issues in the neck low back and hip areas.  These have never shown significant improvement so far since starting hydroxychloroquine .   Previous HPI 04/11/2022 Bianca Michael is a 58 y.o. female here for follow up for seronegative RA on HCQ  400 mg daily. Overall has been doing pretty well but for past about 3 weeks has increased pain and stiffness in right hand and left elbow. Unable to fully extend and flex her left 3rd finger with some triggering. Left elbow feels okay in flexed position some more pain when extending and with rotational movements. She is taking tylenol as needed in addition to her hydroxychloroquine  which helps partially.    Previous HPI 10/11/21 Bianca Michael is a 58 y.o. female here for follow up for seronegative arthritis after starting hydroxychloroquine  400 mg PO daily. At previous visit objective inflammation at the wrist suggestive for dequervains but also polyarthralgia and fatigue. Hand and wrist pain improved a lot now able to make tight fists, but has had some increase overall she associates with timing of weather changes. She sustained a fall after walking for a long time at the mall with no good location to sit for a break and struck the right side of her head. Currently bilateral shoulder pain is worse, especially on left. This has woken her from sleep a few times. No numbness down the arms.     Previous HPI: 07/26/21 Bianca Michael is a 58 y.o. female here for evaluation of positive ANA with recent symptoms of joint pains, pulmonary embolus on xarelto , and peripheral ischemia in toes transiently attributed to vasopressor treatment with loss of toenails. She has a history of hashimoto's thyroiditis versus Graves' disease has been treated with both methimazole and levothyroxine . Overall she has been feeling in about usual health till becoming acutely ill earlier this year.  She does recall some bronchitis type symptoms preceding the January COVID illness and hospitalization.  She does not recall much from that stay since she experienced significant portion of 2 months unconscious in ICU requiring tracheostomy for ventilator support after pulmonary embolus and collapsed lung.  After initially in the  hospital she required extensive physical therapy with inability to walk or even transfer independently.  She does feel she has been more reliant on her upper extremity mobility during the initial period due to severe leg weakness. Currently her worst affected area is the right wrist with ongoing pain and swelling and decreased ability to flex and extend without pain. This has been persistently swollen for weeks, the left wrist hurts just intermittently and without visible swelling. She does have family history with first-degree relative and carrying diagnosis of lupus who has been treated with Plaquenil  and prednisone.   Labs reviewed 06/2021 ANA pos dsDNA 13 RNP, Smith, SSA, SSB, chromatin, Jo-1, centromere neg RF 11.3 Uric acid 7.1 B2GP1 IgA/IgM/IgG neg   03/2021 B2GP1 IgG 60 ACA neg LA neg   Imaging reviewed 07/21/21 Xray right knee 4 views Mild osteoarthritis appears most advanced in the medial compartments. 07/21/21 Xray left knee 4 views Mild osteoarthritis appears most advanced in the medial compartments. 07/21/21 Xray right wrist No acute finding. Mild joint space narrowing at the radiocarpal joint in the region of the radial styloid and navicular consistent with mild  osteoarthritis. 07/21/21 Xray chest 2 views Mild bronchial thickening. Streaky opacity at the left lung base, may be atelectasis or scarring. No confluent consolidation.   Review of Systems  Constitutional:  Positive for fatigue.  HENT:  Positive for mouth dryness. Negative for mouth sores.   Eyes:  Positive for dryness.  Respiratory:  Negative for shortness of breath.   Cardiovascular:  Negative for chest pain and palpitations.  Gastrointestinal:  Positive for constipation. Negative for blood in stool and diarrhea.  Endocrine: Negative for increased urination.  Genitourinary:  Negative for involuntary urination.  Musculoskeletal:  Positive for joint pain, gait problem, joint pain, joint swelling, myalgias,  morning stiffness, muscle tenderness and myalgias. Negative for muscle weakness.  Skin:  Positive for sensitivity to sunlight. Negative for color change, rash and hair loss.  Allergic/Immunologic: Negative for susceptible to infections.  Neurological:  Positive for dizziness and headaches.  Hematological:  Negative for swollen glands.  Psychiatric/Behavioral:  Positive for depressed mood. Negative for sleep disturbance. The patient is not nervous/anxious.     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   Exercise induced laryngeal obstruction (EILO) 09/08/2023   Nocturnal hypoxia 09/08/2023   Iron deficiency 10/17/2022   Elevated serum creatinine 08/16/2021   Morbid obesity (HCC) 08/16/2021   Positive ANA (antinuclear antibody) 07/26/2021   Inflammatory arthritis 07/21/2021   Screening for heart disease 07/21/2021   COVID-19 07/21/2021   History of pulmonary embolism 07/21/2021   Anxiety 07/21/2021   Acute pulmonary embolism without acute cor pulmonale (HCC) 04/07/2021   Elevated liver enzymes 01/28/2021   Hyperlipidemia 01/21/2021   Chronic neck and back pain 03/24/2020   Essential (primary) hypertension 03/13/2020   Major depressive disorder 03/13/2020   Nasal polyp 04/02/2019   Thyroid  disease 06/19/2018    Past Medical History:  Diagnosis Date   Anxiety    Chronic kidney disease    COVID-19 12/2020   Patient was hospitalized   Depression    Hyperlipidemia    Hypertension    Inflammatory arthritis    Pneumothorax    Left lung   Pulmonary embolism (HCC)    Thyroid  disease     Family History  Problem Relation Age of Onset   Breast cancer Mother 49   Colon cancer Mother    Heart attack Father    Heart disease Father    High Cholesterol Father    Thyroid  disease Sister    Breast  cancer Sister    Thyroid  disease Sister    Thyroid  disease Sister    Lupus Sister    Rheum arthritis Sister    Thyroid  disease Daughter    Past Surgical History:  Procedure Laterality Date   CARPAL TUNNEL RELEASE     CHOLECYSTECTOMY     TONSILLECTOMY  1970   TUBAL LIGATION     Social History   Social History Narrative   Not on file   Immunization History  Administered Date(s) Administered   Hep A / Hep B 12/14/2021, 01/31/2022     Objective: Vital Signs: BP 104/70 (BP Location: Left Arm, Patient Position: Sitting, Cuff Size: Normal)   Pulse 74   Resp 14   Ht 5' 4.5" (1.638 m)   Wt 194 lb (88 kg)   BMI 32.79 kg/m    Physical Exam Eyes:     Conjunctiva/sclera: Conjunctivae  normal.  Cardiovascular:     Rate and Rhythm: Normal rate and regular rhythm.  Pulmonary:     Effort: Pulmonary effort is normal.     Breath sounds: Normal breath sounds.  Lymphadenopathy:     Cervical: No cervical adenopathy.  Skin:    General: Skin is warm and dry.     Findings: No rash.  Neurological:     Mental Status: She is alert.  Psychiatric:        Mood and Affect: Mood normal.      Musculoskeletal Exam:  Shoulders full ROM no tenderness or swelling Elbows full ROM no tenderness or swelling Wrists full ROM no tenderness or swelling Fingers chronic heberdon's nodes, no palpable effusions Knees full ROM no swelling, prepatellar bony nodules     Investigation: No additional findings.  Imaging: No results found.  Recent Labs: Lab Results  Component Value Date   WBC 4.5 10/17/2022   HGB 15.1 10/17/2022   PLT 203 10/17/2022   NA 141 10/17/2022   K 4.5 10/17/2022   CL 104 10/17/2022   CO2 28 10/17/2022   GLUCOSE 85 10/17/2022   BUN 12 10/17/2022   CREATININE 1.24 (H) 10/17/2022   BILITOT 0.5 10/17/2022   ALKPHOS 106 02/23/2022   AST 29 10/17/2022   ALT 42 (H) 10/17/2022   PROT 7.4 10/17/2022   ALBUMIN 4.4 02/23/2022   CALCIUM  10.0 10/17/2022    Speciality  Comments: PLQ Eye Exam 02/18/2023 WNL [facility] f/u 12 months  Procedures:  No procedures performed Allergies: Patient has no known allergies.   Assessment / Plan:     Visit Diagnoses: Inflammatory arthritis - Plan: Anti-DNA antibody, double-stranded, Sedimentation rate, C-reactive protein Peripheral joint inflammation appears well-controlled.  Only notable joint changes more consistent with osteoarthritis.  Has noticed clinical benefit multigene to hydroxychloroquine  though may also be having a decrease in inflammation will often be seen with a significant improvement in obesity. - Rechecking clinical markers for inflammation double-stranded knee, sed rate, CRP - Continue hydroxychloroquine  400 mg daily - Reviewed recent labs including blood count and metabolic panel from April appropriate for current medication  Osteoarthritis with bony nodules Chronic osteoarthritis with bony nodules, affecting feet. Stiffness and discomfort noted, no significant inflammatory changes. - Recheck inflammatory antibody markers periodically.  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 No recent discoloration or significant changes.  Dry eyes and mouth Chronic dry eyes and mouth. Limited relief from eye drops, dryness persists.  Weight loss due to medication Significant weight loss over 60 pounds due to wegovy. Experiences nausea and gastrointestinal upset post-administration but overall tolerated reasoably.      Orders: Orders Placed This Encounter  Procedures   Anti-DNA antibody, double-stranded   Sedimentation rate   C-reactive protein   Meds ordered this encounter  Medications   hydroxychloroquine  (PLAQUENIL ) 200 MG tablet    Sig: Take 2 tablets (400 mg total) by mouth daily.    Dispense:  180 tablet    Refill:  1     Follow-Up Instructions: Return in about 6 months (around 11/22/2024) for RA on HCQ f/u  6mos.   Matt Song, MD  Note - This record has been created using [employer].  Chart creation errors have been sought, but may not always  have been located. Such creation errors do not reflect on  the standard of medical care.

## 2024-05-23 ENCOUNTER — Ambulatory Visit: Admitting: Speech Pathology

## 2024-05-23 LAB — ANTI-DNA ANTIBODY, DOUBLE-STRANDED: ds DNA Ab: 5 [IU]/mL — ABNORMAL HIGH

## 2024-05-23 LAB — C-REACTIVE PROTEIN: CRP: <3 mg/L (ref <8.0)

## 2024-05-23 LAB — SEDIMENTATION RATE: Sed Rate: 6 mm/h (ref 0–30)

## 2024-05-27 ENCOUNTER — Ambulatory Visit: Admitting: Speech Pathology

## 2024-05-30 ENCOUNTER — Ambulatory Visit: Admitting: Speech Pathology

## 2024-06-03 ENCOUNTER — Ambulatory Visit: Admitting: Speech Pathology

## 2024-06-05 ENCOUNTER — Ambulatory Visit: Admitting: Speech Pathology

## 2024-06-10 ENCOUNTER — Ambulatory Visit: Admitting: Speech Pathology

## 2024-06-12 ENCOUNTER — Ambulatory Visit: Admitting: Speech Pathology

## 2024-06-17 ENCOUNTER — Ambulatory Visit: Admitting: Speech Pathology

## 2024-06-19 ENCOUNTER — Ambulatory Visit: Admitting: Speech Pathology

## 2024-06-24 ENCOUNTER — Ambulatory Visit: Admitting: Speech Pathology

## 2024-06-26 ENCOUNTER — Ambulatory Visit: Admitting: Speech Pathology

## 2024-07-01 ENCOUNTER — Ambulatory Visit: Admitting: Speech Pathology

## 2024-07-03 ENCOUNTER — Ambulatory Visit: Admitting: Speech Pathology

## 2024-07-05 DIAGNOSIS — Z0000 Encounter for general adult medical examination without abnormal findings: Secondary | ICD-10-CM | POA: Diagnosis not present

## 2024-07-05 DIAGNOSIS — G4733 Obstructive sleep apnea (adult) (pediatric): Secondary | ICD-10-CM | POA: Diagnosis not present

## 2024-07-05 DIAGNOSIS — Z1331 Encounter for screening for depression: Secondary | ICD-10-CM | POA: Diagnosis not present

## 2024-07-05 DIAGNOSIS — E785 Hyperlipidemia, unspecified: Secondary | ICD-10-CM | POA: Diagnosis not present

## 2024-07-05 DIAGNOSIS — N1832 Chronic kidney disease, stage 3b: Secondary | ICD-10-CM | POA: Diagnosis not present

## 2024-07-05 DIAGNOSIS — E079 Disorder of thyroid, unspecified: Secondary | ICD-10-CM | POA: Diagnosis not present

## 2024-07-05 DIAGNOSIS — Z86711 Personal history of pulmonary embolism: Secondary | ICD-10-CM | POA: Diagnosis not present

## 2024-07-05 DIAGNOSIS — F32A Depression, unspecified: Secondary | ICD-10-CM | POA: Diagnosis not present

## 2024-07-05 DIAGNOSIS — M1990 Unspecified osteoarthritis, unspecified site: Secondary | ICD-10-CM | POA: Diagnosis not present

## 2024-07-05 DIAGNOSIS — E66813 Obesity, class 3: Secondary | ICD-10-CM | POA: Diagnosis not present

## 2024-07-05 DIAGNOSIS — I10 Essential (primary) hypertension: Secondary | ICD-10-CM | POA: Diagnosis not present

## 2024-07-05 DIAGNOSIS — J849 Interstitial pulmonary disease, unspecified: Secondary | ICD-10-CM | POA: Diagnosis not present

## 2024-07-05 DIAGNOSIS — I2510 Atherosclerotic heart disease of native coronary artery without angina pectoris: Secondary | ICD-10-CM | POA: Diagnosis not present

## 2024-07-24 ENCOUNTER — Ambulatory Visit: Admitting: Pulmonary Disease

## 2024-07-27 ENCOUNTER — Other Ambulatory Visit: Payer: Self-pay | Admitting: Cardiology

## 2024-08-22 ENCOUNTER — Ambulatory Visit: Admitting: Pulmonary Disease

## 2024-08-22 ENCOUNTER — Encounter: Payer: Self-pay | Admitting: Pulmonary Disease

## 2024-08-22 VITALS — BP 136/96 | HR 68 | Temp 97.8°F | Ht 64.5 in | Wt 194.4 lb

## 2024-08-22 DIAGNOSIS — J8410 Pulmonary fibrosis, unspecified: Secondary | ICD-10-CM | POA: Diagnosis not present

## 2024-08-22 DIAGNOSIS — J45909 Unspecified asthma, uncomplicated: Secondary | ICD-10-CM

## 2024-08-22 DIAGNOSIS — J302 Other seasonal allergic rhinitis: Secondary | ICD-10-CM

## 2024-08-22 DIAGNOSIS — J3800 Paralysis of vocal cords and larynx, unspecified: Secondary | ICD-10-CM

## 2024-08-22 DIAGNOSIS — Z87891 Personal history of nicotine dependence: Secondary | ICD-10-CM | POA: Diagnosis not present

## 2024-08-22 DIAGNOSIS — R0602 Shortness of breath: Secondary | ICD-10-CM

## 2024-08-22 NOTE — Progress Notes (Signed)
 Subjective:    Patient ID: Bianca Michael, female    DOB: 07-11-1966, 58 y.o.   MRN: [id number]  Patient Care Team: Epifanio Alm SQUIBB, MD as PCP - General (Infectious Diseases) Darliss Rogue, MD as PCP - Cardiology (Cardiology) Tamea Dedra CROME, MD as Consulting Physician (Pulmonary Disease)  Chief Complaint  Patient presents with   Shortness of Breath    No breathing problems.     BACKGROUND/INTERVAL:Patient is a 58 year old remote former smoker who follows up for the issue of dyspnea after prolonged hospitalization for COVID-19 in January 2022 with intubation and subsequent tracheostomy.  After removal of her tracheostomy the patient has had persistent hoarseness.  She has had studies as noted below she has mild postinflammatory pulmonary fibrosis.  Her PFTs were mostly restrictive due to postinflammatory pulmonary fibrosis and obesity.   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  The right true cord was normal.  She was referred to speech pathology however she did not follow due to the time commitment.  She was also referred to pulmonary rehabilitation however felt that it was too expensive and she declined.  I last saw her on 11 May 2023, in the interim she has seen Comer Rouleau, NP last on 07 November 2023.  She was instructed at that time to continue Stiolto and as needed albuterol .  Subsequently after that she was seen on 21 March 2024 by me.  This is a follow-up visit.  HPI      DATA: 09/07/2021  PFTs: FEV1 2.28 L or 86% predicted, FVC 2.57 L or 75% predicted, FEV1/FVC 89%, no bronchodilator response.  There is mild restrictive physiology due to ILD and/or obesity (ERV 24%) diffusion capacity was normal. 09/21/2021 CT chest high-resolution: Possible early interstitial lung disease areas of architectural distortion and groundglass attenuation likely representing  infectious or inflammatory scarring.  Mild bronchiectasis. Evidence of three-vessel coronary artery disease.  Severe hepatic steatosis. 10/07/2021 2D echo: LVEF 60 to 65%, mild LVH.  Grade 1 DD.  Mild mitral regurg. 11/01/2021 Myoview : Normal stress test. 11/27/2021 Home sleep study: Mild obstructive sleep apnea with AHI of 9 and SPO2 low of 84%.    Review of Systems A 10 point review of systems was performed and it is as noted above otherwise negative.   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   Exercise induced laryngeal obstruction (EILO) 09/08/2023   Nocturnal hypoxia 09/08/2023   Iron deficiency 10/17/2022   Elevated serum creatinine 08/16/2021   Morbid obesity (HCC) 08/16/2021   Positive ANA (antinuclear antibody) 07/26/2021   Inflammatory arthritis 07/21/2021   Screening for heart disease 07/21/2021   COVID-19 07/21/2021   History of pulmonary embolism 07/21/2021   Anxiety 07/21/2021   Acute pulmonary embolism without acute cor pulmonale (HCC) 04/07/2021   Elevated liver enzymes 01/28/2021   Hyperlipidemia 01/21/2021   Chronic neck and back pain 03/24/2020   Essential (primary) hypertension 03/13/2020   Major depressive disorder 03/13/2020   Nasal polyp 04/02/2019   Thyroid  disease 06/19/2018    Social History   Tobacco Use   Smoking status: Former    Current packs/day: 0.00    Average packs/day: 1 pack/day for 10.0 years (10.0 ttl pk-yrs)    Types: Cigarettes  Start date: 12/26/1990    Quit date: 12/26/2000    Years since quitting: 23.6    Passive exposure: Current   Smokeless tobacco: Never  Substance Use Topics   Alcohol use: Yes    Comment: very rarely    No Known Allergies  Current Meds  Medication Sig   albuterol  (VENTOLIN  HFA) 108 (90 Base) MCG/ACT inhaler Inhale 2 puffs into the  lungs every 4 (four) hours as needed for wheezing or shortness of breath.   amLODipine  (NORVASC ) 5 MG tablet TAKE 1 TABLET (5 MG TOTAL) BY MOUTH DAILY. PLEASE SCHEDULE OFFICE VISIT FOR FURTHER REFILLS   Ascorbic Acid (VITAMIN C) 1000 MG tablet Take 1,000 mg by mouth daily.   Bempedoic Acid  (NEXLETOL ) 180 MG TABS Take 1 tablet (180 mg total) by mouth daily.   buPROPion  (WELLBUTRIN  XL) 300 MG 24 hr tablet Take 300 mg by mouth daily.   Cholecalciferol (VITAMIN D3 ULTRA STRENGTH PO) Take 5,000 Int'l Units/day by mouth.   clopidogrel  (PLAVIX ) 75 MG tablet TAKE 1 TABLET BY MOUTH EVERY DAY   fluticasone  (FLONASE ) 50 MCG/ACT nasal spray Place 2 sprays into both nostrils daily.   hydroxychloroquine  (PLAQUENIL ) 200 MG tablet Take 2 tablets (400 mg total) by mouth daily.   levothyroxine  (SYNTHROID ) 100 MCG tablet    liothyronine (CYTOMEL) 5 MCG tablet Take by mouth.   losartan (COZAAR) 100 MG tablet Take 100 mg by mouth daily.   magnesium oxide (MAG-OX) 400 MG tablet Take by mouth.   MILK THISTLE PO Take 1 capsule by mouth daily.   Multiple Vitamins-Minerals (WOMENS MULTI) CAPS Take 1 capsule by mouth daily.   naltrexone  (DEPADE) 50 MG tablet Take 50 mg by mouth daily.   omeprazole  (PRILOSEC) 40 MG capsule TAKE 1 CAPSULE (40 MG TOTAL) BY MOUTH DAILY.   Tiotropium Bromide-Olodaterol (STIOLTO RESPIMAT ) 2.5-2.5 MCG/ACT AERS Inhale 2 puffs into the lungs daily.   tiZANidine  (ZANAFLEX ) 4 MG tablet Take 1 tablet (4 mg total) by mouth at bedtime as needed for muscle spasms.   WEGOVY 1.7 MG/0.75ML SOAJ SMARTSIG:0.75 Milliliter(s) SUB-Q Once a Week   zinc gluconate 50 MG tablet Take 50 mg by mouth daily.    Immunization History  Administered Date(s) Administered   Hep A / Hep B 12/14/2021, 01/31/2022        Objective:     BP (!) 136/96   Pulse 68   Temp 97.8 F (36.6 C) (Oral)   Ht 5' 4.5 (1.638 m)   Wt 194 lb 6.4 oz (88.2 kg)   SpO2 97%   BMI 32.85 kg/m   SpO2: 97 %  GENERAL: Obese woman,  no acute distress, fully ambulatory, raspy voice, no conversational dyspnea.   HEAD: Normocephalic, atraumatic.  EYES: Pupils equal, round, reactive to light.  No scleral icterus.  MOUTH: Dentition intact, oral mucosa moist.  No thrush. NECK: Supple. No thyromegaly. Trachea midline. No JVD.  No adenopathy.  Well-healed tracheostomy scar. PULMONARY: Good air entry bilaterally.  No adventitious sounds. CARDIOVASCULAR: S1 and S2. Regular rate and rhythm.  Grade 1/6 mitral regurgitation murmur. ABDOMEN: Obese, well-healed PEG scar. MUSCULOSKELETAL: No joint deformity, no clubbing, no edema.  NEUROLOGIC: No focal deficit, no gait disturbance, speech is fluent. SKIN: Intact,warm,dry.   PSYCH: Mood and behavior normal         Assessment & Plan:     ICD-10-CM   1. Vocal cord paresis  J38.00     2. Postinflammatory pulmonary fibrosis (HCC)  J84.10 Pulmonary function test  CT CHEST HIGH RESOLUTION    3. Seasonal allergic rhinitis, unspecified trigger  J30.2     4. 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 - Refer to speech pathologist for therapy and exercises to manage vocal cord paresis and swallowing difficulties.  Post-inflammatory pulmonary fibrosis Post-inflammatory pulmonary fibrosis secondary to COVID-19 with no  progression on previous CT scans. - Order follow-up chest CT to assess post-inflammatory pulmonary fibrosis and evaluate for potential tracheomalacia. - Schedule pulmonary function tests to re-evaluate respiratory status.  Asthma Asthma symptoms are well-managed with current inhaler regimen, using albuterol  as needed and Stiolto daily. - Advise trial discontinuation of Stiolto inhaler to assess symptom control. - Resume Stiolto if shortness of breath or cough worsens.      We will see her back in 6 months time however we will notify her of the results of PFTs and high-resolution chest CT and move up her appointment if necessary.   Advised if symptoms do not improve or worsen, to please contact office for sooner follow up or seek emergency care.    I spent 31 minutes of dedicated to the care of this patient on the date of this encounter to include pre-visit review of records, face-to-face time with the patient discussing conditions above, post visit ordering of testing, clinical documentation with the electronic health record, making appropriate referrals as documented, and communicating necessary findings to members of the patients care team.     C. Leita Sanders, MD Advanced Bronchoscopy PCCM [facility]    *This note was generated using voice recognition software/Dragon and/or AI transcription program.  Despite best efforts to proofread, errors can occur which can change the meaning. Any transcriptional errors that result from this process are unintentional and may not be fully corrected at the time of dictation.

## 2024-08-22 NOTE — Patient Instructions (Signed)
 VISIT SUMMARY:  Today, you came in for a follow-up appointment to discuss your ongoing breathing difficulties. 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 We also discussed your current inhaler use and recent respiratory infections.  YOUR PLAN:  -VOCAL CORD PARESIS: Vocal cord paresis is a condition where the vocal cords do not move properly, often due to nerve damage or airway changes. This can cause difficulty breathing and swallowing. We will refer you to a speech pathologist for therapy and exercises to help manage these symptoms.  -POST-INFLAMMATORY PULMONARY FIBROSIS: Post-inflammatory pulmonary fibrosis is a lung condition that can develop after severe inflammation, such as from an infection like COVID-19. It causes scarring in the lungs, which can make breathing difficult. We will order a follow-up chest CT scan to check the status of your lungs and trachea, and schedule pulmonary function tests to evaluate your respiratory health.  - REACTIVE AIRWAYS: Your reactive airway symptoms are currently well-managed with your inhalers. We will try discontinuing the Stiolto inhaler to see if your symptoms remain controlled. If your shortness of breath or cough worsens, you should resume using Stiolto.  INSTRUCTIONS:  Please follow up with the speech pathologist for therapy and exercises for your vocal cord paresis. Additionally, schedule your follow-up chest CT scan and pulmonary function tests as soon as possible. If your breathing symptoms worsen after discontinuing the Stiolto inhaler, resume its use and contact our office.

## 2024-08-27 DIAGNOSIS — Z124 Encounter for screening for malignant neoplasm of cervix: Secondary | ICD-10-CM | POA: Diagnosis not present

## 2024-08-27 DIAGNOSIS — Z01419 Encounter for gynecological examination (general) (routine) without abnormal findings: Secondary | ICD-10-CM | POA: Diagnosis not present

## 2024-08-27 DIAGNOSIS — Z1331 Encounter for screening for depression: Secondary | ICD-10-CM | POA: Diagnosis not present

## 2024-08-27 DIAGNOSIS — Z1339 Encounter for screening examination for other mental health and behavioral disorders: Secondary | ICD-10-CM | POA: Diagnosis not present

## 2024-08-28 ENCOUNTER — Ambulatory Visit (INDEPENDENT_AMBULATORY_CARE_PROVIDER_SITE_OTHER): Admitting: Pulmonary Disease

## 2024-08-28 DIAGNOSIS — R0602 Shortness of breath: Secondary | ICD-10-CM

## 2024-08-28 DIAGNOSIS — J8410 Pulmonary fibrosis, unspecified: Secondary | ICD-10-CM

## 2024-08-28 LAB — PULMONARY FUNCTION TEST
DL/VA % pred: 99 %
DL/VA: 4.17 ml/min/mmHg/L
DLCO unc % pred: 89 %
DLCO unc: 18.23 ml/min/mmHg
FEF 25-75 Post: 4.73 L/s
FEF 25-75 Pre: 3.87 L/s
FEF2575-%Change-Post: 22 %
FEF2575-%Pred-Post: 198 %
FEF2575-%Pred-Pre: 162 %
FEV1-%Change-Post: 6 %
FEV1-%Pred-Post: 108 %
FEV1-%Pred-Pre: 101 %
FEV1-Post: 2.82 L
FEV1-Pre: 2.64 L
FEV1FVC-%Change-Post: 0 %
FEV1FVC-%Pred-Pre: 110 %
FEV6-%Change-Post: 6 %
FEV6-%Pred-Post: 99 %
FEV6-%Pred-Pre: 93 %
FEV6-Post: 3.24 L
FEV6-Pre: 3.04 L
FEV6FVC-%Pred-Post: 103 %
FEV6FVC-%Pred-Pre: 103 %
FVC-%Change-Post: 6 %
FVC-%Pred-Post: 96 %
FVC-%Pred-Pre: 90 %
FVC-Post: 3.24 L
FVC-Pre: 3.05 L
Post FEV1/FVC ratio: 87 %
Post FEV6/FVC ratio: 100 %
Pre FEV1/FVC ratio: 86 %
Pre FEV6/FVC Ratio: 100 %
RV % pred: 73 %
RV: 1.47 L
TLC % pred: 89 %
TLC: 4.6 L

## 2024-08-28 NOTE — Patient Instructions (Signed)
 Full PFT completed today ? ?

## 2024-08-28 NOTE — Progress Notes (Signed)
 Full PFT completed today ? ?

## 2024-09-05 DIAGNOSIS — H2513 Age-related nuclear cataract, bilateral: Secondary | ICD-10-CM | POA: Diagnosis not present

## 2024-09-05 DIAGNOSIS — M1990 Unspecified osteoarthritis, unspecified site: Secondary | ICD-10-CM | POA: Diagnosis not present

## 2024-09-05 DIAGNOSIS — Z79899 Other long term (current) drug therapy: Secondary | ICD-10-CM | POA: Diagnosis not present

## 2024-10-21 DIAGNOSIS — N1831 Chronic kidney disease, stage 3a: Secondary | ICD-10-CM | POA: Diagnosis not present

## 2024-10-21 DIAGNOSIS — I10 Essential (primary) hypertension: Secondary | ICD-10-CM | POA: Diagnosis not present

## 2024-10-29 ENCOUNTER — Ambulatory Visit: Attending: Physician Assistant | Admitting: Physician Assistant

## 2024-10-29 ENCOUNTER — Encounter: Payer: Self-pay | Admitting: Physician Assistant

## 2024-10-29 VITALS — BP 100/70 | HR 78 | Ht 64.0 in | Wt 189.0 lb

## 2024-10-29 DIAGNOSIS — Z79899 Other long term (current) drug therapy: Secondary | ICD-10-CM | POA: Diagnosis not present

## 2024-10-29 DIAGNOSIS — E785 Hyperlipidemia, unspecified: Secondary | ICD-10-CM | POA: Diagnosis not present

## 2024-10-29 DIAGNOSIS — G4733 Obstructive sleep apnea (adult) (pediatric): Secondary | ICD-10-CM

## 2024-10-29 DIAGNOSIS — I10 Essential (primary) hypertension: Secondary | ICD-10-CM

## 2024-10-29 DIAGNOSIS — N1831 Chronic kidney disease, stage 3a: Secondary | ICD-10-CM | POA: Diagnosis not present

## 2024-10-29 DIAGNOSIS — Z789 Other specified health status: Secondary | ICD-10-CM | POA: Diagnosis not present

## 2024-10-29 DIAGNOSIS — I2510 Atherosclerotic heart disease of native coronary artery without angina pectoris: Secondary | ICD-10-CM | POA: Diagnosis not present

## 2024-10-29 NOTE — Progress Notes (Signed)
 Cardiology Office Note    Date:  10/29/2024   ID:  Bianca Michael, DOB February 16, 1966, MRN [id number]  PCP:  Epifanio Alm SQUIBB, MD  Cardiologist:  Redell Cave, MD  Electrophysiologist:  None   Chief Complaint: Follow up  History of Present Illness:   Bianca Michael is a 58 y.o. 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   She was admitted to the hospital in 12/2020 with COVID with multiple complications including acute hypoxic respiratory failure requiring intubation, ARF requiring temporary dialysis, right ventricular thrombus, PE, and secondary spontaneous pneumothorax treated with chest tube.  Echo during the admission showed preserved biventricular function.  PFTs in 08/2021 showed mild to moderate restrictive lung disease.  Echo in 09/2021 demonstrated an EF of 60 to 65%, no regional wall motion abnormalities, mild LVH, grade 1 diastolic dysfunction, normal RV systolic function, ventricular cavity size, and PASP, mild mitral regurgitation, and an estimated right atrial pressure of 3 mmHg.   She was initially evaluated by Dr. Cave in 09/2021 and started on clopidogrel  at that time for CAD.  Subsequent Lexiscan  MPI in 10/2021 showed no evidence of ischemia or infarction and was found to be overall low risk.  CT attenuated corrected images showed mild aortic atherosclerosis and coronary artery calcifications.  She was seen in the office in 08/2022 and was without symptoms of angina or decompensation with noted stable chronic dyspnea.  She did note myalgias/arthralgias following the initiation of atorvastatin .  With this,  she had not been as active due to discomfort.  In this setting, atorvastatin  was discontinued.  She also noted some positional dizziness following the initiation of Lopressor .  Given dizziness, Lopressor  was discontinued.   She was seen in the office in 10/2022 and with stable chronic dyspnea.  She noted improvement in her myalgias/arthralgias following discontinuation of atorvastatin , though not resolution.  She had self-started red yeast rice.  She did note some palpitations following the initiation of amlodipine  with subsequent Zio patch showing a predominant rhythm of sinus with an average rate of 81 bpm with rare PACs and PVCs.  Patient triggered events were associated with sinus rhythm.  She was seen in the office on 01/17/2023 and continued to report a limited functional status secondary to low back pain and cramping in her foot/calf muscles.  She was not adherent to self initiated red yeast rice and was not interested in pursuing labs or alternative lipid-lowering therapies.  She was seen in the office in 09/2023 and was dealing with cervical spine stenosis and reported chronic stable dyspnea that was unchanged dating back to her COVID infection with underlying pulmonary fibrosis.  She was no longer taking red yeast rice.  It was recommended she undergo a trial of bempedoic acid .  She was last seen in the office in 01/2024 with slightly improved chronic dyspnea.  She was intermittently skipping bempedoic acid , and also was having issues with pharmaceutical supply.  She comes in doing well from a cardiac perspective and remains without symptoms of angina or cardiac decompensation.  She continues to wonder if generalized myalgias and fatigue related to prior statin use.  Tolerating bempedoic acid  without exacerbation of myalgias or fatigue.  However, she does skip some doses of bempedoic  acid.  Also notes some positional dizziness.  Her weight is down 13 pounds by our scale today on GLP-1 therapy.   Labs  independently reviewed: 09/2024 - BUN 12, serum creatinine 1.2, potassium 4, albumin 4.3, AST/ALT normal 06/2024 - Hgb 13.5, PLT 229, TC 201, TG 84, HDL 52, LDL 132, TSH normal  Past Medical History:  Diagnosis Date   Anxiety    Chronic kidney disease    COVID-19 12/2020   Patient was hospitalized   Depression    Hyperlipidemia    Hypertension    Inflammatory arthritis    Pneumothorax    Left lung   Pulmonary embolism (HCC)    Thyroid  disease     Past Surgical History:  Procedure Laterality Date   CARPAL TUNNEL RELEASE     CHOLECYSTECTOMY     TONSILLECTOMY  1970   TUBAL LIGATION      Current Medications: Current Meds  Medication Sig   albuterol  (VENTOLIN  HFA) 108 (90 Base) MCG/ACT inhaler Inhale 2 puffs into the lungs every 4 (four) hours as needed for wheezing or shortness of breath.   Ascorbic Acid (VITAMIN C) 1000 MG tablet Take 1,000 mg by mouth daily.   Bempedoic Acid  (NEXLETOL ) 180 MG TABS Take 1 tablet (180 mg total) by mouth daily.   buPROPion  (WELLBUTRIN  XL) 300 MG 24 hr tablet Take 300 mg by mouth daily.   Cholecalciferol (VITAMIN D3 ULTRA STRENGTH PO) Take 5,000 Int'l Units/day by mouth.   clopidogrel  (PLAVIX ) 75 MG tablet TAKE 1 TABLET BY MOUTH EVERY DAY   fluticasone  (FLONASE ) 50 MCG/ACT nasal spray Place 2 sprays into both nostrils daily.   hydroxychloroquine  (PLAQUENIL ) 200 MG tablet Take 2 tablets (400 mg total) by mouth daily.   levothyroxine  (SYNTHROID ) 100 MCG tablet    liothyronine (CYTOMEL) 5 MCG tablet Take by mouth.   losartan (COZAAR) 100 MG tablet Take 100 mg by mouth daily.   magnesium oxide (MAG-OX) 400 MG tablet Take by mouth.   MILK THISTLE PO Take 1 capsule by mouth daily.   Multiple Vitamins-Minerals (WOMENS MULTI) CAPS Take 1 capsule by mouth daily.   naltrexone  (DEPADE) 50 MG tablet Take 50 mg by mouth daily.   omeprazole  (PRILOSEC) 40 MG capsule TAKE 1 CAPSULE (40 MG TOTAL) BY MOUTH DAILY.   tiZANidine  (ZANAFLEX ) 4 MG tablet Take 1  tablet (4 mg total) by mouth at bedtime as needed for muscle spasms.   WEGOVY 1.7 MG/0.75ML SOAJ SMARTSIG:0.75 Milliliter(s) SUB-Q Once a Week   zinc gluconate 50 MG tablet Take 50 mg by mouth daily.   [DISCONTINUED] amLODipine  (NORVASC ) 5 MG tablet TAKE 1 TABLET (5 MG TOTAL) BY MOUTH DAILY. PLEASE SCHEDULE OFFICE VISIT FOR FURTHER REFILLS    Allergies:   Patient has no known allergies.   Social History   Socioeconomic History   Marital status: Married    Spouse name: Not on file   Number of children: Not on file   Years of education: Not on file   Highest education level: Not on file  Occupational History   Not on file  Tobacco Use   Smoking status: Former    Current packs/day: 0.00    Average packs/day: 1 pack/day for 10.0 years (10.0 ttl pk-yrs)    Types: Cigarettes    Start date: 12/26/1990    Quit date: 12/26/2000    Years since quitting: 23.8    Passive exposure: Current   Smokeless tobacco: Never  Vaping Use   Vaping status: Never Used  Substance  and Sexual Activity   Alcohol use: Yes    Comment: very rarely   Drug use: Not Currently   Sexual activity: Not Currently  Other Topics Concern   Not on file  Social History Narrative   Not on file   Social Drivers of Health   Financial Resource Strain: Low Risk  (08/27/2024)   Received from [facility]   Overall Financial Resource Strain (CARDIA)    Difficulty of Paying Living Expenses: Not very hard  Food Insecurity: No Food Insecurity (08/27/2024)   Received from [facility]   Hunger Vital Sign    Within the past 12 months, you worried that your food would run out before you got the money to buy more.: Never true    Within the past 12 months, the food you bought just didn't last and you didn't have money to get more.: Never true  Transportation Needs: No Transportation Needs (08/27/2024)   Received from [facility] - Transportation    In the past 12 months,  has lack of transportation kept you from medical appointments or from getting medications?: No    Lack of Transportation (Non-Medical): No  Physical Activity: Not on file  Stress: Stress Concern Present (03/09/2021)   Received from [facility] - Occupational Stress Questionnaire    Feeling of Stress : To some extent  Social Connections: Unknown (05/09/2022)   Received from [facility]   Social Network    Social Network: Not on file     Family History:  The patient's family history includes Breast cancer in her sister; Breast cancer (age of onset: 10) in her mother; Colon cancer in her mother; Heart attack in her father; Heart disease in her father; High Cholesterol in her father; Lupus in her sister; Rheum arthritis in her sister; Thyroid  disease in her daughter, sister, sister, and sister.  ROS:   12-point review of systems is negative unless otherwise noted in the HPI.   EKGs/Labs/Other Studies Reviewed:    Studies reviewed were summarized above. The additional studies were reviewed today:  Zio patch 10/2022: Patient had a min HR of 56 bpm, max HR of 137 bpm, and avg HR of 81 bpm. Predominant underlying rhythm was Sinus Rhythm. Isolated SVEs were rare (<1.0%), SVE Couplets were rare (<1.0%), and SVE Triplets were rare (<1.0%). Isolated VEs were rare (<1.0%,  16), VE Triplets were rare (<1.0%, 1), and no VE Couplets were present.    Conclusion Normal cardiac monitor with no significant arrhythmias,  patient triggered events associated with sinus rhythm. __________   Lexiscan  MPI 11/01/2021:   The study is normal. The study is low risk.   No ST deviation was noted.   LV perfusion is normal. There is no evidence of ischemia. There is no evidence of infarction.   Left ventricular function is normal. End diastolic cavity size is normal. End systolic cavity size is normal.   CT attenuation images showed mild aortic and coronary  calcifications. __________   2D echo 10/07/2021: 1. Left ventricular ejection fraction, by estimation, is 60 to 65%. The  left ventricle has normal function. The left ventricle has no regional  wall motion abnormalities. There is mild left ventricular hypertrophy.  Left ventricular diastolic parameters  are consistent with Grade I diastolic dysfunction (impaired relaxation).   2. Right ventricular systolic function is normal. The right ventricular  size is normal. There is normal pulmonary artery  systolic pressure. The  estimated right ventricular systolic pressure is 22.6 mmHg.   3. The mitral valve is normal in structure. Mild mitral valve  regurgitation. No evidence of mitral stenosis.   4. The aortic valve is normal in structure. Aortic valve regurgitation is  not visualized. No aortic stenosis is present.   5. The inferior vena cava is normal in size with greater than 50%  respiratory variability, suggesting right atrial pressure of 3 mmHg.   EKG:  EKG is ordered today.  The EKG ordered today demonstrates NSR, 78 bpm, nonspecific ST-T changes, consistent with prior tracing  Recent Labs: No results found for requested labs within last 365 days.  Recent Lipid Panel    Component Value Date/Time   CHOL 192 02/23/2022 0913   TRIG 273 (H) 02/23/2022 0913   HDL 51 02/23/2022 0913   CHOLHDL 3.8 02/23/2022 0913   LDLCALC 95 02/23/2022 0913    PHYSICAL EXAM:    VS:  BP 100/70 (BP Location: Left Arm, Patient Position: Sitting, Cuff Size: Large)   Pulse 78 Comment: 90 oximeter  Ht 5' 4 (1.626 m)   Wt 189 lb (85.7 kg) Comment: 185.6 this morning when wakeup.  SpO2 98%   BMI 32.44 kg/m   BMI: Body mass index is 32.44 kg/m.  Physical Exam Vitals reviewed.  Constitutional:      Appearance: She is well-developed.  HENT:     Head: Normocephalic and atraumatic.  Eyes:     General:        Right eye: No discharge.        Left eye: No discharge.  Cardiovascular:     Rate and  Rhythm: Normal rate and regular rhythm.     Pulses:          Posterior tibial pulses are 2+ on the right side and 2+ on the left side.     Heart sounds: Normal heart sounds, S1 normal and S2 normal. Heart sounds not distant. No midsystolic click and no opening snap. No murmur heard.    No friction rub.  Pulmonary:     Effort: Pulmonary effort is normal. No respiratory distress.     Breath sounds: Normal breath sounds. No decreased breath sounds, wheezing, rhonchi or rales.  Musculoskeletal:     Cervical back: Normal range of motion.     Right lower leg: No edema.     Left lower leg: No edema.  Skin:    General: Skin is warm and dry.     Nails: There is no clubbing.  Neurological:     Mental Status: She is alert and oriented to person, place, and time.  Psychiatric:        Speech: Speech normal.        Behavior: Behavior normal.        Thought Content: Thought content normal.        Judgment: Judgment normal.     Wt Readings from Last 3 Encounters:  10/29/24 189 lb (85.7 kg)  08/28/24 192 lb (87.1 kg)  08/22/24 194 lb 6.4 oz (88.2 kg)     ASSESSMENT & PLAN:   CAD involving the native coronary arteries without angina: She is doing well and without symptoms concerning for angina.  Continue aggressive risk factor modification including clopidogrel  75 mg and bempedoic acid  180 mg daily.  No longer on beta-blocker secondary to positional dizziness.  No indication for further ischemic testing at this time.  HTN: Blood pressure is slightly low in the office  today with noted positional dizziness.  Discontinue amlodipine .  She otherwise remains on losartan 100 mg.  HLD with statin intolerance: LDL 132 in 06/2024 with normal AST/ALT in 09/2024.  Has not been taking bempedoic acid  on a daily basis, intermittently skipping doses.  She will take consistently for 2 months straight with follow-up lipid panel thereafter.  She will read up on PCSK9 inhibitors.  If LDL remains above goal of 70 with  consistent usage of bempedoic acid , would recommend further discussion of lipid-lowering therapy with PCSK9 inhibitor.  CKD stage IIIa: Followed by nephrology.   OSA: Intolerant to CPAP.  This was not discussed today.     Disposition: F/u with Dr. Darliss or an APP in 3 months.   Medication Adjustments/Labs and Tests Ordered: Current medicines are reviewed at length with the patient today.  Concerns regarding medicines are outlined above. Medication changes, Labs and Tests ordered today are summarized above and listed in the Patient Instructions accessible in Encounters.   Signed, Bernardino Bring, PA-C 10/29/2024 4:55 PM     [facility] [place] [address] [place], [place] [address] [phone]

## 2024-10-29 NOTE — Patient Instructions (Signed)
 Medication Instructions:  Your physician recommends the following medication changes.  STOP TAKING: Amlodipine   RESEARCH PCSK9 Inhibitors: Repatha (evolocumab) 1 micro subcutaneous injection every 2 weeks Praluent (alirocumab) 1 micro subcutaneous injection every 2 or 4 weeks  *If you need a refill on your cardiac medications before your next appointment, please call your pharmacy*  Lab Work: Your provider would like for you to return in 2 months to have the following labs drawn: lipid panel.   Please go to [facility] [address] ([facility] [address], [place] [address] You do not need an appointment.  They are open from 8 am- 4:30 pm.  Lunch from 1:00 pm- 2:00 pm You DO need to be fasting.   You may also go to one of the following [facility]:  [address] [place], [place] [address] Phone: [phone] Lab hours: Mon-Fri 8 am- 5 pm    Lunch 12 pm- 1 pm  [address] [place],  [place]  [address]  [place]  Phone: [phone] Lab hours: 7 am- 4 pm Lunch 12 pm-1 pm   [address] [place],  [place]  [address]  [place]  Phone: [phone] Lab hours: Mon-Fri 8 am- 5 pm    Lunch 12 pm- 1 pm  If you have labs (blood work) drawn today and your tests are completely normal, you will receive your results only by: MyChart Message (if you have MyChart) OR A paper copy in the mail If you have any lab test that is abnormal or we need to change your treatment, we will call you to review the results.  Follow-Up: At [facility], you and your health needs are our priority.  As part of our continuing mission to provide you with exceptional heart care, our providers are all part of one team.  This team includes your primary Cardiologist (physician) and Advanced Practice Providers or APPs (Physician Assistants and Nurse Practitioners) who all work together to provide you with the care you need, when you need it.  Your next appointment:   3 month(s)  Provider:    You may see Redell Cave, MD or Bernardino Bring, PA-C

## 2024-11-14 NOTE — Progress Notes (Signed)
 "  Office Visit Note  Patient: Bianca Michael             Date of Birth: 01/28/1966           MRN: [id number]             PCP: Epifanio Alm SQUIBB, MD Referring: Epifanio Alm SQUIBB, MD Visit Date: 11/25/2024   Subjective:  Discussed the use of AI scribe software for clinical note transcription with the patient, who gave verbal consent to proceed.  History of Present Illness   Bianca Michael is a 58 y.o. 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   She experiences joint stiffness, particularly in the mornings, with her hands feeling cold and swollen. The swelling decreases when she is warm. She also has stiffness in her neck and occasional cracking and pain in her ankle. The stiffness generally improves with movement.  She experiences periodic palpitations. She had recurrent illnesses last winter, requiring different antibiotics, but has not been sick recently.  She is currently on Plaquenil  for her rheumatoid arthritis and notes that it helps with her symptoms. She sometimes skips doses due to the number of medications she takes, but she notices a difference when she does not take it.  Her blood work has shown a slightly abnormal double-strand DNA antibody.   Previous HPI 05/22/2024 Bianca Michael is a 58 y.o. 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   She has ongoing neck issues with her spinal cord described as being compressed. Previous evaluations suggested surgery as a potential option, while more conservative procedural treatments like injections may not be beneficial.   She experiences intermittent throat and voice issues, with her voice fluctuating. She is considering starting voice therapy now that her schedule has opened up.   She describes stiffness in her hands and joints, with her feet being more bothersome than her hands. She attributes some toe issues to nerve damage from a  severe case of COVID-19, during which her toes turned black, and she lost toenails. Her toes can be painful when pressure is applied, such as from a blanket, but there is no recent discoloration.   She has been taking hydroxychloroquine  and reports no recent illnesses or major flare-ups. When she does get sick, it takes longer than usual to recover, often experiencing relapses.   She has experienced significant weight loss, over sixty pounds, which she attributes to taking Locovi, despite it causing nausea.   She reports dry eyes and mouth, using drops for her eyes but still experiencing dryness an hour later. Her feet and ankles swell when not elevated, and she has a history of foot problems from standing on cement floors, including plantar fasciitis-like symptoms.   Her thyroid  levels are within the normal range, but she has felt unusually cold this winter, which she attributes to age.        Previous HPI 10/18/23 Bianca Michael is a 58 y.o. 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  Most of her arthritis symptoms are doing pretty well is having persistent neck pain recently.  No major flareup no visible hand swelling.  But she is noticing numbness and sometimes difficulty with gripping in her hands it does not feel exactly like carpal tunnel syndrome but she had surgical release for in the past.  Does not specifically feel any pain or numbness radiating from the neck down  the arm.  She had another episode of sinusitis requiring antibiotics treatment since the last visit.  She has an appointment scheduled with neurosurgery clinic tomorrow regarding the neck pain also anticipates upcoming labs with nephrology follow-up next week.   Previous HPI 04/18/23 Bianca Michael is a 58 y.o. 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  Overall doing somewhat worse recently, with hand stiffness daily  although better than when not treated at all.  She has been dealing with a difficult to clear sinusitis has done 3 rounds of antibiotics for this.  Has previously had issues with nasal polyps and called to resolve upper respiratory infections before.  Because joint pain issues right now are in the neck and throughout her back   Previous HPI 10/17/22 Bianca Michael is a 58 y.o. 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  Symptoms have been pretty stable since her last visit still feels like she is seeing an improvement in peripheral joint pain and stiffness.  Currently biggest issues in the neck low back and hip areas.  These have never shown significant improvement so far since starting hydroxychloroquine .   Previous HPI 04/11/2022 Bianca Michael is a 58 y.o. 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 Overall has been doing pretty well but for past about 3 weeks has increased pain and stiffness in right hand and left elbow. Unable to fully extend and flex her left 3rd finger with some triggering. Left elbow feels okay in flexed position some more pain when extending and with rotational movements. She is taking tylenol as needed in addition to her hydroxychloroquine  which helps partially.    Previous HPI 10/11/21 Bianca Michael is a 58 y.o. female here for follow up for seronegative arthritis after starting hydroxychloroquine  400 mg PO daily. At previous visit objective inflammation at the wrist suggestive for dequervains but also polyarthralgia and fatigue. Hand and wrist pain improved a lot now able to make tight fists, but has had some increase overall she associates with timing of weather changes. She sustained a fall after walking for a long time at the mall with no good location to sit for a break and struck the right side of her head. Currently bilateral shoulder pain is worse, especially on left. This has woken her from  sleep a few times. No numbness down the arms.     Previous HPI: 07/26/21 Bianca Michael is a 58 y.o. female here for evaluation of positive ANA with recent symptoms of joint pains, pulmonary embolus on xarelto , and peripheral ischemia in toes transiently attributed to vasopressor treatment with loss of toenails. She has a history of hashimoto's thyroiditis versus Graves' disease has been treated with both methimazole and levothyroxine . Overall she has been feeling in about usual health till becoming acutely ill earlier this year.  She does recall some bronchitis type symptoms preceding the January COVID illness and hospitalization.  She does not recall much from that stay since she experienced significant portion of 2 months unconscious in ICU requiring tracheostomy for ventilator support after pulmonary embolus and collapsed lung.  After initially in the hospital she required extensive physical therapy with inability to walk or even transfer independently.  She does feel she has been more reliant on her upper extremity mobility during the initial period due to severe leg weakness. Currently her worst affected area is the right  wrist with ongoing pain and swelling and decreased ability to flex and extend without pain. This has been persistently swollen for weeks, the left wrist hurts just intermittently and without visible swelling. She does have family history with first-degree relative and carrying diagnosis of lupus who has been treated with Plaquenil  and prednisone.   Labs reviewed 06/2021 ANA pos dsDNA 13 RNP, Smith, SSA, SSB, chromatin, Jo-1, centromere neg RF 11.3 Uric acid 7.1 B2GP1 IgA/IgM/IgG neg   03/2021 B2GP1 IgG 60 ACA neg LA neg   Imaging reviewed 07/21/21 Xray right knee 4 views Mild osteoarthritis appears most advanced in the medial compartments. 07/21/21 Xray left knee 4 views Mild osteoarthritis appears most advanced in the medial compartments. 07/21/21 Xray right  wrist No acute finding. Mild joint space narrowing at the radiocarpal joint in the region of the radial styloid and navicular consistent with mild osteoarthritis. 07/21/21 Xray chest 2 views Mild bronchial thickening. Streaky opacity at the left lung base, may be atelectasis or scarring. No confluent consolidation.    Review of Systems  Constitutional:  Positive for fatigue.  HENT:  Positive for mouth sores and mouth dryness.   Eyes:  Positive for dryness.  Respiratory:  Positive for shortness of breath.   Cardiovascular:  Positive for palpitations. Negative for chest pain.  Gastrointestinal:  Positive for constipation. Negative for blood in stool and diarrhea.  Endocrine: Negative for increased urination.  Genitourinary:  Positive for involuntary urination.  Musculoskeletal:  Positive for joint pain, gait problem, joint pain, joint swelling, myalgias, muscle weakness, morning stiffness, muscle tenderness and myalgias.  Skin:  Positive for hair loss. Negative for color change, rash and sensitivity to sunlight.  Allergic/Immunologic: Positive for susceptible to infections.  Neurological:  Positive for dizziness and headaches.  Hematological:  Negative for swollen glands.  Psychiatric/Behavioral:  Positive for depressed mood and sleep disturbance. The patient is nervous/anxious.     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   Exercise induced laryngeal obstruction (EILO) 09/08/2023   Nocturnal hypoxia 09/08/2023   Iron deficiency 10/17/2022   Elevated serum creatinine 08/16/2021   Morbid obesity (HCC) 08/16/2021   Positive ANA (antinuclear antibody) 07/26/2021   Inflammatory arthritis 07/21/2021   Screening for heart disease 07/21/2021   COVID-19 07/21/2021   History of pulmonary embolism 07/21/2021    Anxiety 07/21/2021   Acute pulmonary embolism without acute cor pulmonale (HCC) 04/07/2021   Elevated liver enzymes 01/28/2021   Hyperlipidemia 01/21/2021   Chronic neck and back pain 03/24/2020   Essential (primary) hypertension 03/13/2020   Major depressive disorder 03/13/2020   Nasal polyp 04/02/2019   Thyroid  disease 06/19/2018    Past Medical History:  Diagnosis Date   Anxiety    Blood transfusion without reported diagnosis 01/2021   Chronic kidney disease    COVID-19 12/2020   Patient was hospitalized   Depression    GERD (gastroesophageal reflux disease)    Hyperlipidemia    Hypertension    Inflammatory arthritis    Pneumothorax    Left lung   Pulmonary embolism (HCC)    Thyroid  disease     Family History  Problem Relation Age of Onset   Breast cancer Mother 76   Colon cancer Mother    Cancer Mother    Heart attack Father    Heart disease Father  High Cholesterol Father    Thyroid  disease Sister    Breast cancer Sister    Cancer Sister    Thyroid  disease Sister    Thyroid  disease Sister    Lupus Sister    Rheum arthritis Sister    Hyperlipidemia Sister    Thyroid  disease Daughter    Past Surgical History:  Procedure Laterality Date   CARPAL TUNNEL RELEASE     CHOLECYSTECTOMY     TONSILLECTOMY  1970   TUBAL LIGATION     Social History   Social History Narrative   Not on file   Immunization History  Administered Date(s) Administered   Hep A / Hep B 12/14/2021, 01/31/2022     Objective: Vital Signs: BP 105/63   Pulse 68   Temp (!) 97.5 F (36.4 C)   Resp 16   Ht 5' 4 (1.626 m)   Wt 183 lb 6.4 oz (83.2 kg)   BMI 31.48 kg/m    Physical Exam Eyes:     Conjunctiva/sclera: Conjunctivae normal.  Cardiovascular:     Rate and Rhythm: Normal rate and regular rhythm.  Pulmonary:     Effort: Pulmonary effort is normal.     Breath sounds: Normal breath sounds.  Lymphadenopathy:     Cervical: No cervical adenopathy.  Skin:    General:  Skin is warm and dry.  Neurological:     Mental Status: She is alert.  Psychiatric:        Mood and Affect: Mood normal.      Musculoskeletal Exam:  Neck full ROM no tenderness Shoulders full ROM no tenderness or swelling Elbows full ROM no tenderness or swelling Wrists full ROM no tenderness or swelling Fingers chronic heberdon's nodes, no palpable effusions  Knees full ROM no tenderness or swelling Ankles full ROM no tenderness or swelling MTPs full ROM no tenderness or swelling  Investigation: No additional findings.  Imaging: No results found.  Recent Labs: Lab Results  Component Value Date   WBC 4.5 10/17/2022   HGB 15.1 10/17/2022   PLT 203 10/17/2022   NA 141 10/17/2022   K 4.5 10/17/2022   CL 104 10/17/2022   CO2 28 10/17/2022   GLUCOSE 85 10/17/2022   BUN 12 10/17/2022   CREATININE 1.24 (H) 10/17/2022   BILITOT 0.5 10/17/2022   ALKPHOS 106 02/23/2022   AST 29 10/17/2022   ALT 42 (H) 10/17/2022   PROT 7.4 10/17/2022   ALBUMIN 4.4 02/23/2022   CALCIUM  10.0 10/17/2022    Speciality Comments: PLQ Eye Exam 09/05/2024 WNL @ [facility] f/u 12 months  Procedures:  No procedures performed Allergies: Patient has no known allergies.   Assessment / Plan:     Visit Diagnoses: Inflammatory arthritis - Plan: hydroxychloroquine  (PLAQUENIL ) 200 MG tablet, Sedimentation rate, Anti-DNA antibody, double-stranded, C3 and C4 Peripheral joint inflammation appears well-controlled.  Only notable joint changes more consistent with osteoarthritis.  Has noticed clinical benefit to hydroxychloroquine  though may also be having a decrease in inflammation will often be seen with a significant improvement in obesity. - Rechecking clinical markers for inflammation double-stranded knee, sed rate, CRP - Continue hydroxychloroquine  400 mg daily    Osteoarthritis Chronic osteoarthritis with morning stiffness and joint pain, particularly in the hands and ankles. May be seeing  some benefit with. Symptoms improve with movement and warmth.  Peripheral neuropathy Chronic peripheral neuropathy with nerve damage in the toes, causing tenderness and discomfort.  Chronic kidney disease due to prior acute kidney injury Chronic kidney  disease secondary to prior acute kidney injury with well-managed kidney function and no evidence of ongoing damage. No proteinuria, indicating stable kidney function post-injury.    Orders: Orders Placed This Encounter  Procedures   Sedimentation rate   Anti-DNA antibody, double-stranded   C3 and C4   Meds ordered this encounter  Medications   hydroxychloroquine  (PLAQUENIL ) 200 MG tablet    Sig: Take 2 tablets (400 mg total) by mouth daily.    Dispense:  180 tablet    Refill:  1     Follow-Up Instructions: Return in about 6 months (around 05/26/2025) for RA/?SLE on HCQ f/u 6mos.   Lonni LELON Ester, MD  Note - This record has been created using [employer].  Chart creation errors have been sought, but may not always  have been located. Such creation errors do not reflect on  the standard of medical care. "

## 2024-11-25 ENCOUNTER — Encounter: Payer: Self-pay | Admitting: Internal Medicine

## 2024-11-25 ENCOUNTER — Ambulatory Visit: Attending: Internal Medicine | Admitting: Internal Medicine

## 2024-11-25 VITALS — BP 105/63 | HR 68 | Temp 97.5°F | Resp 16 | Ht 64.0 in | Wt 183.4 lb

## 2024-11-25 DIAGNOSIS — M1380 Other specified arthritis, unspecified site: Secondary | ICD-10-CM | POA: Diagnosis not present

## 2024-11-25 DIAGNOSIS — I2699 Other pulmonary embolism without acute cor pulmonale: Secondary | ICD-10-CM

## 2024-11-25 MED ORDER — HYDROXYCHLOROQUINE SULFATE 200 MG PO TABS: 400.0000 mg | ORAL_TABLET | Freq: Every day | ORAL | 1 refills | Status: AC

## 2024-11-26 LAB — ANTI-DNA ANTIBODY, DOUBLE-STRANDED: ds DNA Ab: 3 [IU]/mL

## 2024-11-26 LAB — SEDIMENTATION RATE: Sed Rate: 9 mm/h (ref 0–30)

## 2024-11-26 LAB — C3 AND C4
C3 Complement: 127 mg/dL (ref 83–193)
C4 Complement: 20 mg/dL (ref 15–57)

## 2025-01-08 ENCOUNTER — Ambulatory Visit
Admission: RE | Admit: 2025-01-08 | Discharge: 2025-01-08 | Disposition: A | Discharge status: Home / Self Care | Source: Ambulatory Visit | Attending: Cardiovascular Disease | Admitting: Cardiovascular Disease

## 2025-01-08 DIAGNOSIS — R0602 Shortness of breath: Secondary | ICD-10-CM | POA: Diagnosis present

## 2025-01-08 DIAGNOSIS — J8410 Pulmonary fibrosis, unspecified: Secondary | ICD-10-CM | POA: Diagnosis present

## 2025-01-10 ENCOUNTER — Ambulatory Visit: Payer: Self-pay | Admitting: Pulmonary Disease

## 2025-01-29 LAB — LIPID PANEL
Chol/HDL Ratio: 3.7 ratio (ref 0.0–4.4)
Cholesterol, Total: 163 mg/dL (ref 100–199)
HDL: 44 mg/dL
LDL Chol Calc (NIH): 86 mg/dL (ref 0–99)
Triglycerides: 195 mg/dL — ABNORMAL HIGH (ref 0–149)
VLDL Cholesterol Cal: 33 mg/dL (ref 5–40)

## 2025-01-30 ENCOUNTER — Encounter: Payer: Self-pay | Admitting: Physician Assistant

## 2025-01-30 ENCOUNTER — Ambulatory Visit: Payer: Self-pay | Admitting: Physician Assistant

## 2025-01-30 ENCOUNTER — Ambulatory Visit: Admitting: Physician Assistant

## 2025-01-30 VITALS — BP 120/84 | HR 82 | Ht 64.5 in | Wt 177.0 lb

## 2025-01-30 DIAGNOSIS — I10 Essential (primary) hypertension: Secondary | ICD-10-CM | POA: Diagnosis not present

## 2025-01-30 DIAGNOSIS — T466X5D Adverse effect of antihyperlipidemic and antiarteriosclerotic drugs, subsequent encounter: Secondary | ICD-10-CM

## 2025-01-30 DIAGNOSIS — I2510 Atherosclerotic heart disease of native coronary artery without angina pectoris: Secondary | ICD-10-CM | POA: Diagnosis not present

## 2025-01-30 DIAGNOSIS — N1831 Chronic kidney disease, stage 3a: Secondary | ICD-10-CM | POA: Diagnosis not present

## 2025-01-30 DIAGNOSIS — Z789 Other specified health status: Secondary | ICD-10-CM

## 2025-01-30 DIAGNOSIS — E785 Hyperlipidemia, unspecified: Secondary | ICD-10-CM | POA: Diagnosis not present

## 2025-01-30 DIAGNOSIS — G4733 Obstructive sleep apnea (adult) (pediatric): Secondary | ICD-10-CM

## 2025-01-30 DIAGNOSIS — M791 Myalgia, unspecified site: Secondary | ICD-10-CM | POA: Diagnosis not present

## 2025-01-30 NOTE — Patient Instructions (Signed)
 Medication Instructions:  Your physician recommends that you continue on your current medications as directed. Please refer to the Current Medication list given to you today.   *If you need a refill on your cardiac medications before your next appointment, please call your pharmacy*  Lab Work: None ordered at this time  If you have labs (blood work) drawn today and your tests are completely normal, you will receive your results only by: MyChart Message (if you have MyChart) OR A paper copy in the mail If you have any lab test that is abnormal or we need to change your treatment, we will call you to review the results.  Testing/Procedures: None ordered at this time   Follow-Up: At [facility], you and your health needs are our priority.  As part of our continuing mission to provide you with exceptional heart care, our providers are all part of one team.  This team includes your primary Cardiologist (physician) and Advanced Practice Providers or APPs (Physician Assistants and Nurse Practitioners) who all work together to provide you with the care you need, when you need it.  Your next appointment:   6 month(s)  Provider:   You may see Redell Cave, MD or Bernardino Bring, PA-C

## 2025-05-26 ENCOUNTER — Ambulatory Visit: Admitting: Internal Medicine
